# Patient Record
Sex: Male | Born: 1950 | Race: White | Hispanic: No | Smoking: Never smoker
Health system: Southern US, Community
[De-identification: ages and names within clinical notes are randomized; demographics above are authoritative.]

## PROBLEM LIST (undated history)

## (undated) DIAGNOSIS — E7800 Pure hypercholesterolemia, unspecified: Secondary | ICD-10-CM

## (undated) DIAGNOSIS — C4490 Unspecified malignant neoplasm of skin, unspecified: Secondary | ICD-10-CM

## (undated) DIAGNOSIS — F419 Anxiety disorder, unspecified: Secondary | ICD-10-CM

## (undated) DIAGNOSIS — R51 Headache: Secondary | ICD-10-CM

## (undated) DIAGNOSIS — I10 Essential (primary) hypertension: Secondary | ICD-10-CM

## (undated) DIAGNOSIS — K219 Gastro-esophageal reflux disease without esophagitis: Secondary | ICD-10-CM

## (undated) DIAGNOSIS — K449 Diaphragmatic hernia without obstruction or gangrene: Secondary | ICD-10-CM

## (undated) HISTORY — DX: Unspecified malignant neoplasm of skin, unspecified: C44.90

## (undated) HISTORY — PX: OTHER SURGICAL HISTORY: SHX169

## (undated) HISTORY — DX: Pure hypercholesterolemia, unspecified: E78.00

## (undated) HISTORY — PX: EYE SURGERY: SHX253

## (undated) HISTORY — PX: APPENDECTOMY: SHX54

## (undated) HISTORY — PX: SINOSCOPY: SHX187

## (undated) HISTORY — PX: CATARACT EXTRACTION: SUR2

## (undated) HISTORY — PX: SKIN CANCER EXCISION: SHX779

## (undated) HISTORY — DX: Essential (primary) hypertension: I10

## (undated) HISTORY — DX: Diaphragmatic hernia without obstruction or gangrene: K44.9

---

## 2000-10-17 ENCOUNTER — Ambulatory Visit (HOSPITAL_COMMUNITY): Admission: RE | Admit: 2000-10-17 | Discharge: 2000-10-17 | Payer: Self-pay | Admitting: Internal Medicine

## 2001-05-19 ENCOUNTER — Ambulatory Visit (HOSPITAL_COMMUNITY): Admission: RE | Admit: 2001-05-19 | Discharge: 2001-05-19 | Payer: Self-pay | Admitting: Family Medicine

## 2001-05-19 ENCOUNTER — Encounter: Payer: Self-pay | Admitting: Family Medicine

## 2002-03-04 ENCOUNTER — Encounter: Payer: Self-pay | Admitting: Family Medicine

## 2002-03-04 ENCOUNTER — Ambulatory Visit (HOSPITAL_COMMUNITY): Admission: RE | Admit: 2002-03-04 | Discharge: 2002-03-05 | Payer: Self-pay | Admitting: Family Medicine

## 2003-07-11 ENCOUNTER — Ambulatory Visit (HOSPITAL_COMMUNITY): Admission: RE | Admit: 2003-07-11 | Discharge: 2003-07-11 | Payer: Self-pay | Admitting: Family Medicine

## 2005-01-03 ENCOUNTER — Ambulatory Visit (HOSPITAL_COMMUNITY): Admission: RE | Admit: 2005-01-03 | Discharge: 2005-01-03 | Payer: Self-pay | Admitting: Family Medicine

## 2005-03-08 ENCOUNTER — Ambulatory Visit (HOSPITAL_COMMUNITY): Admission: RE | Admit: 2005-03-08 | Discharge: 2005-03-08 | Payer: Self-pay | Admitting: Internal Medicine

## 2005-03-08 ENCOUNTER — Encounter (INDEPENDENT_AMBULATORY_CARE_PROVIDER_SITE_OTHER): Payer: Self-pay | Admitting: Internal Medicine

## 2005-03-08 ENCOUNTER — Ambulatory Visit: Payer: Self-pay | Admitting: Internal Medicine

## 2005-05-15 ENCOUNTER — Encounter (HOSPITAL_COMMUNITY): Admission: RE | Admit: 2005-05-15 | Discharge: 2005-06-14 | Payer: Self-pay | Admitting: Preventative Medicine

## 2005-07-22 ENCOUNTER — Ambulatory Visit (HOSPITAL_COMMUNITY): Admission: RE | Admit: 2005-07-22 | Discharge: 2005-07-22 | Payer: Self-pay | Admitting: Internal Medicine

## 2005-09-13 ENCOUNTER — Ambulatory Visit (HOSPITAL_COMMUNITY): Admission: RE | Admit: 2005-09-13 | Discharge: 2005-09-13 | Payer: Self-pay | Admitting: Family Medicine

## 2007-12-23 ENCOUNTER — Observation Stay (HOSPITAL_COMMUNITY): Admission: EM | Admit: 2007-12-23 | Discharge: 2007-12-24 | Payer: Self-pay | Admitting: Emergency Medicine

## 2007-12-23 ENCOUNTER — Encounter (INDEPENDENT_AMBULATORY_CARE_PROVIDER_SITE_OTHER): Payer: Self-pay | Admitting: General Surgery

## 2008-11-14 ENCOUNTER — Ambulatory Visit (HOSPITAL_COMMUNITY): Admission: RE | Admit: 2008-11-14 | Discharge: 2008-11-14 | Payer: Self-pay | Admitting: Family Medicine

## 2009-07-13 ENCOUNTER — Ambulatory Visit (HOSPITAL_COMMUNITY): Admission: RE | Admit: 2009-07-13 | Discharge: 2009-07-13 | Payer: Self-pay | Admitting: Family Medicine

## 2010-08-29 ENCOUNTER — Ambulatory Visit (INDEPENDENT_AMBULATORY_CARE_PROVIDER_SITE_OTHER): Payer: BC Managed Care – PPO | Admitting: Internal Medicine

## 2010-08-29 DIAGNOSIS — K219 Gastro-esophageal reflux disease without esophagitis: Secondary | ICD-10-CM

## 2010-09-09 NOTE — Consult Note (Signed)
NAME:  [name], [name] NO.:  [id number]  MEDICAL RECORD NO.:  [id number]           PATIENT TYPE:  LOCATION:                                 FACILITY:  PHYSICIAN:  Lionel December, M.D.    DATE OF BIRTH:  1951-02-07  DATE OF CONSULTATION: DATE OF DISCHARGE:                                CONSULTATION   Dennis Miles is a 60 year old male referred to our office by Charlann Lange, PA-C at [facility].  He was referred for esophageal pain and epigastric pain.  He had symptoms for the past 2 months.  He was started back on Nexium 2 months ago and states his symptoms are much better now.  He occasionally does have a sharp esophageal pain.  He says the pain was not constant when he had it.  He did have some dysphagia in the past 2 months but that has now resolved.  Actually all of his symptoms have resolved since he started the Nexium.  His appetite is good.  There has been no weight loss.  He usually has a bowel movement once a day.  He denies any melena or rectal bleeding.  His last colonoscopy was in October 2006.  It was a screening colonoscopy.  He also had occasional hematochezia felt to be secondary to hemorrhoids.  The biopsy revealed polyp at the sigmoid colon, inflamed fragments of colonic mucosa.  No tumor seen.  He is allergic to CODEINE which causes nausea.  He is on Nexium 20 mg a day, quinapril 20 mg a day, colchicine as needed, and allopurinol 100 mg a day.  SURGERIES:  He had sinus surgery and an appendectomy.  PREVIOUS MEDICAL HISTORY:  High cholesterol, hypertension, gout, and a hiatal hernia.  FAMILY HISTORY:  His mother is deceased from CHF and kidney failure. Father is deceased from COPD and he was bedridden.  Two sisters in good health.  One brother alive with a history of alcoholism.  He is divorced.  He works at [employer].  He does not smoke.  He drinks about 2 beers a day and he has 2 children in good health.  OBJECTIVE:  VITAL SIGNS:   His weight is 159, his height is 5 feet 9 inches, his temperature is 98, his blood pressure is 126/84, and his pulse is 72. HEENT:  He has natural teeth.  His oral mucosa is moist.  There are no lesions.  His conjunctivae are pink.  His sclerae are anicteric. NECK:  His thyroid is normal.  There is no cervical lymphadenopathy. LUNGS:  Clear. HEART:  Regular rate and rhythm. ABDOMEN:  Soft.  Bowel sounds are positive.  No masses.  No tenderness.  ASSESSMENT:  Dennis Miles is a 60 year old male presenting with complaints of gastroesophageal reflux disease which has now resolved since starting the Nexium.  He also was having dysphagia and that is cleared also.  He states he really does feel much better since starting the Nexium.  RECOMMENDATIONS:  He will continue to keep head of his bed up.  No spicy foods.  I will give him a prescription for Nexium 40 mg, #90.  He will call with a progress report in 2 weeks.  At this time, the symptoms are much improved, so we will hold an EGD/ED on him at this time.    ______________________________ Dennis Ar, NP   ______________________________ Lionel December, M.D.    TS/MEDQ  D:  08/29/2010  T:  08/30/2010  Job:  [id number]  Electronically Signed by Dennis Ar PA on 09/05/2010 04:52:28 PM Electronically Signed by Lionel December M.D. on 09/09/2010 02:04:13 PM

## 2010-10-16 NOTE — Op Note (Signed)
NAME:  Dennis Miles, Dennis Miles               ACCOUNT NO.:  [id number]   MEDICAL RECORD NO.:  [id number]          PATIENT TYPE:  AMB   LOCATION:  DAY                           FACILITY:  [facility]   PHYSICIAN:  Dalia Heading, M.D.  DATE OF BIRTH:  1951-04-12   DATE OF PROCEDURE:  12/23/2007  DATE OF DISCHARGE:                               OPERATIVE REPORT   PREOPERATIVE DIAGNOSIS:  Acute appendicitis.   POSTOPERATIVE DIAGNOSIS:  Meckel's diverticulitis.   PROCEDURE:  Laparoscopic Meckel's diverticulectomy, appendectomy.   SURGEON:  Dalia Heading, MD   ANESTHESIA:  General endotracheal.   INDICATIONS:  The patient is a 60 year old white male who presented to  the emergency room with a 24-hour history of worsening right lower  quadrant abdominal pain.  CT scan of the abdomen and pelvis revealed  acute appendicitis.  The patient now comes to the operating room for a  laparoscopic appendectomy.  The risks and benefits of the procedure  including bleeding, infection, and the possibility of an open procedure  were fully explained to the patient, gave informed consent.   PROCEDURE NOTE:  The patient was placed in the supine position.  After  induction of general endotracheal anesthesia, the abdomen was prepped  and draped using the usual sterile technique with Betadine.  Surgical  site confirmation was performed.   A supraumbilical incision was made down to the fascia.  Veress needle  was introduced into the abdominal cavity and confirmation of placement  was done using the saline drop test.  The abdomen was then insufflated  to 16 mmHg of pressure.  An 11-mm trocar was introduced into the  abdominal cavity under direct visualization without difficulty.  The  patient was placed in deeper Trendelenburg position.  Additional 12-mm  trocar was placed in the suprapubic region and a 5-mm trocar was placed  in the left lower quadrant region.  On inspection of the right lower  quadrant, the patient  was noted to have a large Meckel's diverticulum,  which appeared to be inflamed, adjacent to the appendix.  The  diverticulum appeared to be the source of the patient's pain.  It was  felt that removal of both was warranted.  The mesoappendix was divided  using the harmonic scalpel.  A vascular Endo-GIA was placed across the  base of the appendix and fired.  The appendix was removed using  Endocatch bag.  The staple line was inspected and noted to be within  normal limits.  The Meckel's diverticulum was removed at space using a  standard Endo-GIA.  The diverticulum was removed using Endocatch bag.  The staple line was inspected and noted to be within normal limits.  Surgicel was placed across the staple line.  The right lower quadrant  was copiously irrigated with normal saline.  All fluid and air were then  evacuated from the abdominal cavity prior to removal of the trocars.   All wounds were irrigated with normal saline.  All wounds were injected  with 0.5% Sensorcaine.  The supraumbilical fascia as well as suprapubic  fascia were reapproximated using an 0 Vicryl  interrupted suture.  All  skin incisions were closed using staples.  Betadine ointment and dry  sterile dressings were applied.   All tape and needle counts were correct at the end of the procedure.  The patient was extubated in the operating room and went back to the  recovery room awake in stable condition.   COMPLICATIONS:  None.   SPECIMEN:  Appendix, Meckel's diverticulum.   BLOOD LOSS:  Minimal.      Dalia Heading, M.D.  Electronically Signed     MAJ/MEDQ  D:  12/23/2007  T:  12/24/2007  Job:  [id number]   cc:   Madelin Rear. Sherwood Gambler, MD  Fax: [phone]

## 2010-10-19 NOTE — Op Note (Signed)
NAME:  Dennis Miles, Dennis Miles               ACCOUNT NO.:  [id number]   MEDICAL RECORD NO.:  [id number]          PATIENT TYPE:  AMB   LOCATION:  DAY                           FACILITY:  [facility]   PHYSICIAN:  Lionel December, M.D.    DATE OF BIRTH:  [birth year]   DATE OF PROCEDURE:  03/08/2005  DATE OF DISCHARGE:                                 OPERATIVE REPORT   PROCEDURE:  Colonoscopy.   INDICATION:  Mindy is a 60 year old Caucasian male who is here for  screening colonoscopy. He has occasional hematochezia felt to be secondary  to hemorrhoids. Procedure risks were reviewed with the patient, and informed  consent was obtained.   PREMEDICATION:  Demerol 50 mg IV, Versed 6 mg IV.   FINDINGS:  Procedure performed in endoscopy suite. The patient's vital signs  and O2 saturation were monitored during the procedure and remained stable.  The patient was placed in left lateral position. Rectal examination  performed. He had soft, small sentinel skin tags. Digital exam was normal.  Olympus videoscope was placed in rectum and advanced under vision into  sigmoid colon and beyond. Preparation was satisfactory. Scope was passed to  cecum which was identified by ileocecal valve and appendiceal orifice.  Pictures taken for the record. As the scope was withdrawn, colonic mucosa  was carefully examined. There was a 5-mm polyp at distal sigmoid colon which  was ablated easily via cold biopsy. Mucosa of the rest of the sigmoid colon  and rectum was normal. Scope was retroflexed to examine anorectal junction,  and small hemorrhoids were noted below the dentate line. Endoscope was  straightened and withdrawn. The patient tolerated the procedure well.   FINAL DIAGNOSIS:  1.  Small sigmoid polyp which was ablated via cold biopsy.  2.  External hemorrhoids.   RECOMMENDATIONS:  Standard instructions given.   I will be contacting the patient with biopsy results and further  recommendations.      Lionel December,  M.D.  Electronically Signed    NR/MEDQ  D:  03/08/2005  T:  03/08/2005  Job:  [id number]   cc:   Patrica Duel, M.D.  Fax: [phone]

## 2011-03-01 LAB — DIFFERENTIAL
Basophils Absolute: 0
Basophils Relative: 0
Eosinophils Absolute: 0.4
Eosinophils Relative: 3
Eosinophils Relative: 4
Lymphocytes Relative: 15
Lymphs Abs: 1.4
Monocytes Absolute: 0.9
Monocytes Relative: 10
Monocytes Relative: 12

## 2011-03-01 LAB — CBC
HCT: 39.2
MCHC: 35.2
MCV: 95.7
Platelets: 149 — ABNORMAL LOW
RBC: 4.09 — ABNORMAL LOW
RDW: 12.6

## 2011-03-01 LAB — BASIC METABOLIC PANEL
BUN: 6
Calcium: 8.3 — ABNORMAL LOW
Creatinine, Ser: 0.96
GFR calc non Af Amer: >60
GFR calc non Af Amer: >60
Glucose, Bld: 99

## 2011-03-01 LAB — URINALYSIS, ROUTINE W REFLEX MICROSCOPIC
Bilirubin Urine: NEGATIVE
Specific Gravity, Urine: 1.01

## 2011-04-01 ENCOUNTER — Other Ambulatory Visit (INDEPENDENT_AMBULATORY_CARE_PROVIDER_SITE_OTHER): Payer: Self-pay | Admitting: *Deleted

## 2011-04-01 ENCOUNTER — Encounter (INDEPENDENT_AMBULATORY_CARE_PROVIDER_SITE_OTHER): Payer: Self-pay | Admitting: Internal Medicine

## 2011-04-01 ENCOUNTER — Ambulatory Visit (INDEPENDENT_AMBULATORY_CARE_PROVIDER_SITE_OTHER): Payer: BC Managed Care – PPO | Admitting: Internal Medicine

## 2011-04-01 ENCOUNTER — Encounter (INDEPENDENT_AMBULATORY_CARE_PROVIDER_SITE_OTHER): Payer: Self-pay | Admitting: *Deleted

## 2011-04-01 VITALS — BP 102/82 | HR 72 | Temp 98.2°F | Ht 69.0 in | Wt 168.3 lb

## 2011-04-01 DIAGNOSIS — K2289 Other specified disease of esophagus: Secondary | ICD-10-CM

## 2011-04-01 DIAGNOSIS — K279 Peptic ulcer, site unspecified, unspecified as acute or chronic, without hemorrhage or perforation: Secondary | ICD-10-CM

## 2011-04-01 DIAGNOSIS — K228 Other specified diseases of esophagus: Secondary | ICD-10-CM

## 2011-04-01 DIAGNOSIS — I10 Essential (primary) hypertension: Secondary | ICD-10-CM | POA: Insufficient documentation

## 2011-04-01 DIAGNOSIS — M109 Gout, unspecified: Secondary | ICD-10-CM | POA: Insufficient documentation

## 2011-04-01 DIAGNOSIS — E782 Mixed hyperlipidemia: Secondary | ICD-10-CM | POA: Insufficient documentation

## 2011-04-01 MED ORDER — SUCRALFATE 1 GM/10ML PO SUSP: 1.0000 g | Freq: Four times a day (QID) | ORAL | Status: DC

## 2011-04-01 NOTE — Patient Instructions (Addendum)
Will schedule and EGD with Dr. Karilyn Cota.The risks and benefits such as perforation, bleeding, and infection were reviewed with the patient and is agreeable. Will E-prescribe an Rx for Carafate to [facility]

## 2011-04-01 NOTE — Progress Notes (Signed)
Subjective:     Patient ID: Dennis Miles, male   DOB: 01/29/1951, 60 y.o.   MRN: [id number]  HPIHe presents today with c/o that he his having severe pain at his Adam's apple and down his throat. Symptoms for 6 months.  Says it will get worse and then get better.  Sometimes it feels like a [place] has kicked him in his throat.   He takes a Zantac at night on a prn basis.  No dysphagia.  Symptms worse at night. Hx of gout.  Just getting over it now.  His appetite is good. He has gained weight from his last visit of approximately 9 pounds. He was seen in March of this year for esophageal pain and epigastric pain.  He had stopped his Nexium.  Once getting back on the Nexium his symptoms improved. He also had dysphagia which resolved after taking the Nexium. Review of Systems  See hpi Current Outpatient Prescriptions  Medication Sig Dispense Refill  . allopurinol (ZYLOPRIM) 100 MG tablet Take 100 mg by mouth daily.        . colchicine 0.6 MG tablet Take 0.6 mg by mouth daily.        Marland Kitchen esomeprazole (NEXIUM) 20 MG capsule Take 20 mg by mouth daily before breakfast.        . quinapril (ACCUPRIL) 20 MG tablet Take 20 mg by mouth at bedtime.        Marland Kitchen zolpidem (AMBIEN) 10 MG tablet Take 10 mg by mouth at bedtime as needed.        . sucralfate (CARAFATE) 1 GM/10ML suspension Take 10 mLs (1 g total) by mouth 4 (four) times daily.  420 mL  1   Past Surgical History  Procedure Date  . Sinus sugery   . Appendectomy    Past Medical History  Diagnosis Date  . Gout   . High cholesterol   . Hypertension   . Hiatal hernia    Allergies  Allergen Reactions  . Codeine    Family Status  Relation Status Death Age  . Mother Deceased     CHF  . Father Deceased     COPD.   Marland Kitchen Sister Alive     good health  . Brother Alive     Alcoholic    History   Social History  . Marital Status: Divorced    Spouse Name: N/A    Number of Children: N/A  . Years of Education: N/A   Occupational History  . Not on  file.   Social History Main Topics  . Smoking status: Never Smoker   . Smokeless tobacco: Not on file  . Alcohol Use: Yes     2 beers a day  . Drug Use: No  . Sexually Active: Not on file   Other Topics Concern  . Not on file   Social History Narrative  . No narrative on file           Objective:   Physical Exam Filed Vitals:   04/01/11 1643  BP: 102/82  Pulse: 72  Temp: 98.2 F (36.8 C)  Height: 5\' 9"  (1.753 m)  Weight: 168 lb 4.8 oz (76.34 kg)    Alert and oriented. Skin warm and dry. Oral mucosa is moist. Natural teeth in good condition. Sclera anicteric, conjunctivae is pink. Thyroid not enlarged. No cervical lymphadenopathy. Lungs clear. Heart regular rate and rhythm.  Abdomen is soft. Bowel sounds are positive. No hepatomegaly. No abdominal masses felt. No tenderness.  No edema to lower extremities. Patient is alert and oriented.      Assessment:    Esophageal pain for 6 months which has not improve with Nexium and Zantac.  PUD needs to be ruled out.  Plan:    EGD.  The risks and benefits such as perforation, bleeding, and infection were reviewed with the patient and is agreeable.

## 2011-04-11 ENCOUNTER — Encounter (HOSPITAL_COMMUNITY): Payer: Self-pay | Admitting: Pharmacy Technician

## 2011-04-16 MED ORDER — SODIUM CHLORIDE 0.45 % IV SOLN
Freq: Once | INTRAVENOUS | Status: AC
  Administered 2011-04-17: 08:00:00 via INTRAVENOUS

## 2011-04-17 ENCOUNTER — Ambulatory Visit (HOSPITAL_COMMUNITY)
Admission: RE | Admit: 2011-04-17 | Discharge: 2011-04-17 | Disposition: A | Discharge status: Home / Self Care | Payer: BC Managed Care – PPO | Source: Ambulatory Visit | Attending: Internal Medicine | Admitting: Internal Medicine

## 2011-04-17 ENCOUNTER — Encounter (HOSPITAL_COMMUNITY): Payer: Self-pay | Admitting: *Deleted

## 2011-04-17 ENCOUNTER — Encounter (HOSPITAL_COMMUNITY)
Admission: RE | Disposition: A | Discharge status: Home / Self Care | Payer: Self-pay | Source: Ambulatory Visit | Attending: Internal Medicine

## 2011-04-17 ENCOUNTER — Other Ambulatory Visit (INDEPENDENT_AMBULATORY_CARE_PROVIDER_SITE_OTHER): Payer: Self-pay | Admitting: Internal Medicine

## 2011-04-17 DIAGNOSIS — I10 Essential (primary) hypertension: Secondary | ICD-10-CM | POA: Insufficient documentation

## 2011-04-17 DIAGNOSIS — K219 Gastro-esophageal reflux disease without esophagitis: Secondary | ICD-10-CM | POA: Insufficient documentation

## 2011-04-17 DIAGNOSIS — K228 Other specified diseases of esophagus: Secondary | ICD-10-CM

## 2011-04-17 DIAGNOSIS — E7800 Pure hypercholesterolemia, unspecified: Secondary | ICD-10-CM | POA: Insufficient documentation

## 2011-04-17 DIAGNOSIS — Z79899 Other long term (current) drug therapy: Secondary | ICD-10-CM | POA: Insufficient documentation

## 2011-04-17 DIAGNOSIS — K2289 Other specified disease of esophagus: Secondary | ICD-10-CM

## 2011-04-17 DIAGNOSIS — K222 Esophageal obstruction: Secondary | ICD-10-CM

## 2011-04-17 DIAGNOSIS — R1013 Epigastric pain: Secondary | ICD-10-CM | POA: Insufficient documentation

## 2011-04-17 HISTORY — DX: Gastro-esophageal reflux disease without esophagitis: K21.9

## 2011-04-17 HISTORY — DX: Headache: R51

## 2011-04-17 HISTORY — PX: ESOPHAGOGASTRODUODENOSCOPY: SHX5428

## 2011-04-17 HISTORY — DX: Anxiety disorder, unspecified: F41.9

## 2011-04-17 SURGERY — EGD (ESOPHAGOGASTRODUODENOSCOPY)
Anesthesia: Moderate Sedation

## 2011-04-17 MED ORDER — MEPERIDINE HCL 50 MG/ML IJ SOLN
INTRAMUSCULAR | Status: AC
  Filled 2011-04-17: qty 1

## 2011-04-17 MED ORDER — BUTAMBEN-TETRACAINE-BENZOCAINE 2-2-14 % EX AERO
INHALATION_SPRAY | CUTANEOUS | Status: DC | PRN
  Administered 2011-04-17: 2 via TOPICAL

## 2011-04-17 MED ORDER — MIDAZOLAM HCL 5 MG/5ML IJ SOLN
INTRAMUSCULAR | Status: DC | PRN
  Administered 2011-04-17 (×4): 2 mg via INTRAVENOUS

## 2011-04-17 MED ORDER — MEPERIDINE HCL 25 MG/ML IJ SOLN
INTRAMUSCULAR | Status: DC | PRN
  Administered 2011-04-17 (×2): 25 mg via INTRAVENOUS

## 2011-04-17 MED ORDER — MIDAZOLAM HCL 5 MG/5ML IJ SOLN
INTRAMUSCULAR | Status: AC
  Filled 2011-04-17: qty 10

## 2011-04-17 MED ORDER — STERILE WATER FOR IRRIGATION IR SOLN
Status: DC | PRN
  Administered 2011-04-17: 09:00:00

## 2011-04-17 NOTE — H&P (Signed)
This is update to history and physical from 04/01/2011. Patient has chronic GERD refractory to therapy now. He is undergoing diagnostic EGD. He is up-to-date on his screening for colorectal carcinoma. His last colonoscopy was in 2006.

## 2011-04-17 NOTE — Op Note (Signed)
EGD PROCEDURE REPORT  PATIENT:  Dennis Miles  MR#:  [id number] Birthdate:  02-25-1951, 60 y.o., male Endoscopist:  Dr. Malissa Hippo, MD Referred By:  Ms. Terie Purser, [facility] / Dr. Madelin Rear. Fusco M.D. Procedure Date: 04/17/2011  Procedure:   EGD with ED  Indications:  Patient is 60 year-old Caucasian male with chronic GERD maintenance PPI was been experiencing recurrent anus upper sternal area as well as Adam's apple and regurgitation. He is doing better with PPI. He has history of esophageal stricture which was last dilated in may 2002. At the present time he denies dysphagia.            Informed Consent: Procedure and risks were reviewed with the patient and informed consent was obtained. Medications:  Demerol 50 mg IV Versed 8 mg IV Cetacaine spray topically for oropharyngeal anesthesia  Description of procedure:  The endoscope was introduced through the mouth and advanced to the second portion of the duodenum without difficulty or limitations. The mucosal surfaces were surveyed very carefully during advancement of the scope and upon withdrawal.  Findings:  Esophagus:  There were multiple fine circumferential rings most pronounced in the mid esophagus.no erosions or ulcers were noted. Stricture noted at GE junction she disrupted by passing the scope and subsequently with 54 [place] Maloney dilator.  GEJ:   39 cm Stomach:  Was empty and distended very well with insufflation. Folds in the proximal stomach were normal. Mucosa at body, antrum, pyloric channel, angularis, fundus and cardia was normal. Duodenum:  Normal bulbar and post bulbar mucosa.  Therapeutic/Diagnostic Maneuvers Performed:  Esophageal dilation performed by passing 54 [place] Maloney dilator resulting in linear tear involving distal 2 cm of the esophagus extending to GE and small focal area with mucosal disruption at proximal esophagus. Esophageal biopsy taken for routine histology.  Complications:   None  Impression: No evidence of erosive esophagitis. Distal esophageal stricture dilated by passing 54 [place] Maloney dilator. 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  Recommendations:  Continue anti-reflex measures and Nexium at 40 mg by mouth every morning. I will be contacting patient with results of biopsy.Marland Kitchen  REHMAN,NAJEEB U  04/17/2011  9:16 AM  CC: Ms. Terie Purser, [facility]         Dr. Madelin Rear. Sherwood Gambler, MD.

## 2011-04-23 ENCOUNTER — Encounter (INDEPENDENT_AMBULATORY_CARE_PROVIDER_SITE_OTHER): Payer: Self-pay | Admitting: *Deleted

## 2011-04-24 ENCOUNTER — Encounter (HOSPITAL_COMMUNITY): Payer: Self-pay | Admitting: Internal Medicine

## 2012-04-08 ENCOUNTER — Encounter (INDEPENDENT_AMBULATORY_CARE_PROVIDER_SITE_OTHER): Payer: Self-pay | Admitting: *Deleted

## 2012-04-15 ENCOUNTER — Encounter (INDEPENDENT_AMBULATORY_CARE_PROVIDER_SITE_OTHER): Payer: Self-pay | Admitting: Internal Medicine

## 2012-04-15 ENCOUNTER — Ambulatory Visit (INDEPENDENT_AMBULATORY_CARE_PROVIDER_SITE_OTHER): Payer: Managed Care, Other (non HMO) | Admitting: Internal Medicine

## 2012-04-15 VITALS — BP 118/80 | HR 72 | Temp 98.1°F | Ht 68.0 in | Wt 180.4 lb

## 2012-04-15 DIAGNOSIS — K219 Gastro-esophageal reflux disease without esophagitis: Secondary | ICD-10-CM | POA: Insufficient documentation

## 2012-04-15 MED ORDER — PANTOPRAZOLE SODIUM 40 MG PO TBEC: 40.0000 mg | DELAYED_RELEASE_TABLET | Freq: Every day | ORAL | Status: DC

## 2012-04-15 NOTE — Addendum Note (Signed)
Addended by: Len Blalock on: 04/15/2012 04:55 PM   Modules accepted: Orders

## 2012-04-15 NOTE — Patient Instructions (Signed)
Continue present medications. 

## 2012-04-15 NOTE — Progress Notes (Addendum)
Subjective:     Patient ID: Dennis Miles, male   DOB: Jul 26, 1950, 61 y.o.   MRN: [id number]  HPI Dennis Miles is  61 yr old male here today for f/u. He was last seen a year ago with c/o chest pain and dysphagia. Her stays tired all the time. Appetite is good. No weight loss.  He has actually gained 12 pounds since his last visit.  Acid reflux is better. He has had 2 episodes of choking. He had pill dysphagia. No solid food dysphagia.  BMs are normal. No melena or bright red rectal bleeding.    04/16/2012 EGD/ED: Impression:  No evidence of erosive esophagitis.  Distal esophageal stricture dilated by passing 54 [place] Maloney dilator.  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 Biopsy: No evidence of eosinophilic esophagitis. He is able to swallow much better.   Review of Systems see hpi Current Outpatient Prescriptions  Medication Sig Dispense Refill  . acetaminophen (TYLENOL) 650 MG CR tablet Take 650 mg by mouth every 8 (eight) hours as needed. For pain        . allopurinol (ZYLOPRIM) 100 MG tablet Take 100 mg by mouth as needed.       . cetirizine (ZYRTEC) 10 MG tablet Take 10 mg by mouth daily.        . colchicine 0.6 MG tablet Take 0.6 mg by mouth daily.        Marland Kitchen esomeprazole (NEXIUM) 20 MG capsule Take 20 mg by mouth daily before breakfast.        . fluticasone (FLONASE) 50 MCG/ACT nasal spray Place 1 spray into the nose daily as needed. For congestion      . naproxen sodium (ANAPROX) 220 MG tablet Take 220 mg by mouth 2 (two) times daily with a meal.      . quinapril (ACCUPRIL) 20 MG tablet Take 20 mg by mouth at bedtime.        Marland Kitchen zolpidem (AMBIEN) 10 MG tablet Take 10 mg by mouth at bedtime as needed. For sleep      . ranitidine (ZANTAC) 150 MG tablet Take 150 mg by mouth at bedtime.        Past Medical History  Diagnosis Date  . Gout   . High cholesterol   . Hypertension   . Hiatal hernia   . GERD (gastroesophageal reflux disease)   .  Anxiety   . Headache    Allergies  Allergen Reactions  . Codeine Nausea Only        Objective:   Physical Exam  Filed Vitals:   04/15/12 1614  BP: 118/80  Pulse: 72  Temp: 98.1 F (36.7 C)  Height: 5\' 8"  (1.727 m)  Weight: 180 lb 6.4 oz (81.829 kg)  Alert and oriented. Skin warm and dry. Oral mucosa is moist.   . Sclera anicteric, conjunctivae is pink. Thyroid not enlarged. No cervical lymphadenopathy. Lungs clear. Heart regular rate and rhythm.  Abdomen is soft. Bowel sounds are positive. No hepatomegaly. No abdominal masses felt. No tenderness.  No edema to lower extremities.       Assessment:    GERD controlled at this time. He did have 2 episodes of pill dysphagia. No solid food dysphagia. Acid reflux controlled at this time with his PPI.    Plan:     OV in 1 yrs. Refill on Protonix 40mg  po daily

## 2012-07-09 ENCOUNTER — Other Ambulatory Visit (HOSPITAL_COMMUNITY): Payer: Self-pay | Admitting: Family Medicine

## 2012-07-09 ENCOUNTER — Ambulatory Visit (HOSPITAL_COMMUNITY)
Admission: RE | Admit: 2012-07-09 | Discharge: 2012-07-09 | Disposition: A | Discharge status: Home / Self Care | Payer: Managed Care, Other (non HMO) | Source: Ambulatory Visit | Attending: Family Medicine | Admitting: Family Medicine

## 2012-07-09 DIAGNOSIS — M25529 Pain in unspecified elbow: Secondary | ICD-10-CM | POA: Insufficient documentation

## 2012-07-09 DIAGNOSIS — X58XXXA Exposure to other specified factors, initial encounter: Secondary | ICD-10-CM | POA: Insufficient documentation

## 2012-07-09 DIAGNOSIS — S5000XA Contusion of unspecified elbow, initial encounter: Secondary | ICD-10-CM

## 2012-10-09 ENCOUNTER — Other Ambulatory Visit (HOSPITAL_COMMUNITY): Payer: Self-pay | Admitting: Internal Medicine

## 2012-10-09 ENCOUNTER — Ambulatory Visit (HOSPITAL_COMMUNITY)
Admission: RE | Admit: 2012-10-09 | Discharge: 2012-10-09 | Disposition: A | Discharge status: Home / Self Care | Payer: Managed Care, Other (non HMO) | Source: Ambulatory Visit | Attending: Internal Medicine | Admitting: Internal Medicine

## 2012-10-09 DIAGNOSIS — N508 Other specified disorders of male genital organs: Secondary | ICD-10-CM | POA: Insufficient documentation

## 2012-10-09 DIAGNOSIS — R1032 Left lower quadrant pain: Secondary | ICD-10-CM | POA: Insufficient documentation

## 2012-10-09 DIAGNOSIS — N5089 Other specified disorders of the male genital organs: Secondary | ICD-10-CM

## 2012-10-09 DIAGNOSIS — N50812 Left testicular pain: Secondary | ICD-10-CM

## 2012-10-09 DIAGNOSIS — R10814 Left lower quadrant abdominal tenderness: Secondary | ICD-10-CM

## 2012-10-09 DIAGNOSIS — N433 Hydrocele, unspecified: Secondary | ICD-10-CM | POA: Insufficient documentation

## 2012-10-09 DIAGNOSIS — I861 Scrotal varices: Secondary | ICD-10-CM | POA: Insufficient documentation

## 2012-10-09 DIAGNOSIS — N509 Disorder of male genital organs, unspecified: Secondary | ICD-10-CM | POA: Insufficient documentation

## 2012-10-09 MED ORDER — IOHEXOL 300 MG/ML  SOLN
50.0000 mL | Freq: Once | INTRAMUSCULAR | Status: AC | PRN
  Administered 2012-10-09: 50 mL via ORAL

## 2012-10-09 MED ORDER — IOHEXOL 300 MG/ML  SOLN
100.0000 mL | Freq: Once | INTRAMUSCULAR | Status: AC | PRN
  Administered 2012-10-09: 100 mL via INTRAVENOUS

## 2012-10-16 ENCOUNTER — Telehealth: Payer: Self-pay | Admitting: Internal Medicine

## 2012-10-16 ENCOUNTER — Encounter (INDEPENDENT_AMBULATORY_CARE_PROVIDER_SITE_OTHER): Payer: Self-pay | Admitting: *Deleted

## 2012-10-16 NOTE — Telephone Encounter (Signed)
Information noted. It makes sense for patient to stay with Dr. Karilyn Cota, however, Dr. Vevelyn Pat me and asks [place] to see ASAP.. I  conclude that he intended for him to followup with Dr. Karilyn Cota expeditiously

## 2012-10-16 NOTE — Telephone Encounter (Signed)
I received a referral from [facility] on May 9 and since this is an established patient of NUR I forwarded the referral to his office. This morning per RMR and CM I was to call the patient to make him an URG OV with [place] because the patient was not getting any better per PCP. I called patient and he said he has been seeing NUR for 20+ years and would feel more comfortable staying with him. Patient said that he thought his PCP understood that he wanted to stay with NUR. I told patient that I would call NUR office to let them know about referral and to call to set up his OV.

## 2012-10-19 ENCOUNTER — Telehealth (INDEPENDENT_AMBULATORY_CARE_PROVIDER_SITE_OTHER): Payer: Self-pay | Admitting: *Deleted

## 2012-10-19 ENCOUNTER — Encounter (INDEPENDENT_AMBULATORY_CARE_PROVIDER_SITE_OTHER): Payer: Self-pay | Admitting: Internal Medicine

## 2012-10-19 ENCOUNTER — Ambulatory Visit (INDEPENDENT_AMBULATORY_CARE_PROVIDER_SITE_OTHER): Payer: Managed Care, Other (non HMO) | Admitting: Internal Medicine

## 2012-10-19 ENCOUNTER — Other Ambulatory Visit (INDEPENDENT_AMBULATORY_CARE_PROVIDER_SITE_OTHER): Payer: Self-pay | Admitting: *Deleted

## 2012-10-19 DIAGNOSIS — R1031 Right lower quadrant pain: Secondary | ICD-10-CM

## 2012-10-19 DIAGNOSIS — R1030 Lower abdominal pain, unspecified: Secondary | ICD-10-CM | POA: Insufficient documentation

## 2012-10-19 DIAGNOSIS — R935 Abnormal findings on diagnostic imaging of other abdominal regions, including retroperitoneum: Secondary | ICD-10-CM

## 2012-10-19 DIAGNOSIS — R1032 Left lower quadrant pain: Secondary | ICD-10-CM

## 2012-10-19 DIAGNOSIS — G8929 Other chronic pain: Secondary | ICD-10-CM

## 2012-10-19 MED ORDER — PEG-KCL-NACL-NASULF-NA ASC-C 100 G PO SOLR: 1.0000 | Freq: Once | ORAL | Status: DC

## 2012-10-19 MED ORDER — CIPROFLOXACIN HCL 500 MG PO TABS: 500.0000 mg | ORAL_TABLET | Freq: Two times a day (BID) | ORAL | Status: DC

## 2012-10-19 MED ORDER — METRONIDAZOLE 500 MG PO TABS: 500.0000 mg | ORAL_TABLET | Freq: Three times a day (TID) | ORAL | Status: DC

## 2012-10-19 NOTE — Progress Notes (Addendum)
Subjective:     Patient ID: Dennis Miles, male   DOB: January 22, 1951, 62 y.o.   MRN: [id number]  HPI Referred to our office for rectal bleeding. The week in April, he tells me he vomited for 12 hrs and then had diarrhea. He thinks he had a fever.Since then, he has had a pain in his left groin.  He had a CT scan and [place]. Pain for over a month.  No appetite.  He has lost 20 pounds since November.   He has a BM x 1 a day. Normal caliber. No pain with his BM. Sometimes he feels like he has the urge to have a BM. He is presently on Cipro and Flagyl for prostatits. He says it is very uncomfortable. Small amt of rectal bleeding 3-4 weeks ago. More uncomfortable in the evening and at night. Urine occasional dark.    10/09/2012 WBC 4.8, H and H 15.7 an 44.0, Platelet ct 210.  Total bili 0.9, ALp 69, AST 23, ALT 17.   CBC    Component Value Date/Time   WBC 7.1 12/24/2007 0614   RBC 3.82* 12/24/2007 0614   HGB 12.7* 12/24/2007 0614   HCT 36.9* 12/24/2007 0614   PLT 149* 12/24/2007 0614   MCV 96.5 12/24/2007 0614   MCHC 34.5 12/24/2007 0614   RDW 12.3 12/24/2007 0614   LYMPHSABS 1.2 12/24/2007 0614   MONOABS 0.8 12/24/2007 0614   EOSABS 0.2 12/24/2007 0614   BASOSABS 0.0 12/24/2007 0614   .10/09/2012 Colonoscopy: IMPRESSION:  Trace pelvic free fluid which is generally considered abnormal but  nonspecific in a male patient. This may suggest bowel pathology,  but there is no area of bowel wall thickening or focal segmental  dilatation. Subclinical diverticulitis or other enteritis could  have this appearance. Close clinical follow-up is recommended. If  the patient has not had screening colonoscopy or virtual  colonoscopy as part of routine surveillance, this could be  considered when symptoms have resolved.     03/08/2005 Screening colonoscopy (occasionally hematochezia): FINAL DIAGNOSIS:  1. Small sigmoid polyp which was ablated via cold biopsy.  2. External hemorrhoids. 03/08/2005 Biopsy:   DIAGNOSIS: 1) polyp (Sigmoid colon, polypectomy): INFLAMED FRAGMENT OF COLONIC MUCOSA. NO TUMOR SEEN.   04/07/2011 EGD/ED: Impression:  No evidence of erosive esophagitis.  Distal esophageal stricture dilated by passing 54 [place] Maloney dilator.  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       Review of Systems Current Outpatient Prescriptions  Medication Sig Dispense Refill  . acetaminophen (TYLENOL) 650 MG CR tablet Take 650 mg by mouth every 8 (eight) hours as needed. For pain        . naproxen sodium (ANAPROX) 220 MG tablet Take 220 mg by mouth 2 (two) times daily with a meal.      . pantoprazole (PROTONIX) 40 MG tablet Take 1 tablet (40 mg total) by mouth daily.  30 tablet  11  . quinapril (ACCUPRIL) 20 MG tablet Take 20 mg by mouth at bedtime.        . tamsulosin (FLOMAX) 0.4 MG CAPS Take by mouth.      . zolpidem (AMBIEN) 10 MG tablet Take 10 mg by mouth at bedtime as needed. For sleep      . allopurinol (ZYLOPRIM) 100 MG tablet Take 100 mg by mouth as needed.       . cetirizine (ZYRTEC) 10 MG tablet Take 10 mg by mouth daily.        Marland Kitchen  ciprofloxacin (CIPRO) 500 MG tablet Take 1 tablet (500 mg total) by mouth 2 (two) times daily.  14 tablet  0  . colchicine 0.6 MG tablet Take 0.6 mg by mouth daily.        . fluticasone (FLONASE) 50 MCG/ACT nasal spray Place 1 spray into the nose daily as needed. For congestion      . metroNIDAZOLE (FLAGYL) 500 MG tablet Take 1 tablet (500 mg total) by mouth 3 (three) times daily.  21 tablet  0  . peg 3350 powder (MOVIPREP) 100 G SOLR Take 1 kit (100 g total) by mouth once.  1 kit  0   No current facility-administered medications for this visit.   Past Medical History  Diagnosis Date  . Gout   . High cholesterol   . Hypertension   . GERD (gastroesophageal reflux disease)   . Anxiety   . Headache   . Hiatal hernia    Past Surgical History  Procedure Laterality Date  . Sinus sugery    .  Appendectomy    . Esophagogastroduodenoscopy  04/17/2011    Procedure: ESOPHAGOGASTRODUODENOSCOPY (EGD);  Surgeon: Malissa Hippo, MD;  Location: [facility];  Service: Endoscopy;  Laterality: N/A;  8:30   Allergies  Allergen Reactions  . Codeine Nausea Only       Objective:   Physical Exam  Filed Vitals:   10/19/12 1434  BP: 132/78  Pulse: 60  Height: 5\' 8"  (1.727 m)  Weight: 157 lb 12.8 oz (71.578 kg)  Alert and oriented. Skin warm and dry. Oral mucosa is moist.   . Sclera anicteric, conjunctivae is pink. Thyroid not enlarged. No cervical lymphadenopathy. Lungs clear. Heart regular rate and rhythm.  Abdomen is soft. Bowel sounds are positive. No hepatomegaly. No abdominal masses felt.Tenderness left lower quadrant.  No edema to lower extremities.        Assessment:     Left lower quadrant pain. ? Etiology.  ? Diverticulitis. Colonic neoplasm needs to be ruled out.  I discussed this case with Dr. Karilyn Cota.     Plan:   Colonoscopy with Dr. Karilyn Cota. Will continue Cipro and Flagyl for 7 more days.

## 2012-10-19 NOTE — Telephone Encounter (Signed)
Patient needs movi prep 

## 2012-10-19 NOTE — Patient Instructions (Addendum)
Colonoscopy.  The risks and benefits such as perforation, bleeding, and infection were reviewed with the patient and is agreeable. 

## 2012-10-20 ENCOUNTER — Encounter (HOSPITAL_COMMUNITY): Payer: Self-pay | Admitting: Pharmacy Technician

## 2012-10-21 ENCOUNTER — Encounter (INDEPENDENT_AMBULATORY_CARE_PROVIDER_SITE_OTHER): Payer: Self-pay

## 2012-10-23 ENCOUNTER — Encounter (HOSPITAL_COMMUNITY): Payer: Self-pay | Admitting: *Deleted

## 2012-10-23 ENCOUNTER — Ambulatory Visit (HOSPITAL_COMMUNITY)
Admission: RE | Admit: 2012-10-23 | Discharge: 2012-10-23 | Disposition: A | Discharge status: Home / Self Care | Payer: Managed Care, Other (non HMO) | Source: Ambulatory Visit | Attending: Internal Medicine | Admitting: Internal Medicine

## 2012-10-23 ENCOUNTER — Encounter (HOSPITAL_COMMUNITY)
Admission: RE | Disposition: A | Discharge status: Home / Self Care | Payer: Self-pay | Source: Ambulatory Visit | Attending: Internal Medicine

## 2012-10-23 DIAGNOSIS — D128 Benign neoplasm of rectum: Secondary | ICD-10-CM | POA: Insufficient documentation

## 2012-10-23 DIAGNOSIS — K5730 Diverticulosis of large intestine without perforation or abscess without bleeding: Secondary | ICD-10-CM

## 2012-10-23 DIAGNOSIS — K921 Melena: Secondary | ICD-10-CM

## 2012-10-23 DIAGNOSIS — D129 Benign neoplasm of anus and anal canal: Secondary | ICD-10-CM | POA: Insufficient documentation

## 2012-10-23 DIAGNOSIS — R198 Other specified symptoms and signs involving the digestive system and abdomen: Secondary | ICD-10-CM | POA: Insufficient documentation

## 2012-10-23 DIAGNOSIS — R1032 Left lower quadrant pain: Secondary | ICD-10-CM

## 2012-10-23 DIAGNOSIS — K644 Residual hemorrhoidal skin tags: Secondary | ICD-10-CM | POA: Insufficient documentation

## 2012-10-23 DIAGNOSIS — I10 Essential (primary) hypertension: Secondary | ICD-10-CM | POA: Insufficient documentation

## 2012-10-23 DIAGNOSIS — E7800 Pure hypercholesterolemia, unspecified: Secondary | ICD-10-CM | POA: Insufficient documentation

## 2012-10-23 DIAGNOSIS — Z885 Allergy status to narcotic agent status: Secondary | ICD-10-CM | POA: Insufficient documentation

## 2012-10-23 DIAGNOSIS — Z791 Long term (current) use of non-steroidal anti-inflammatories (NSAID): Secondary | ICD-10-CM | POA: Insufficient documentation

## 2012-10-23 DIAGNOSIS — M109 Gout, unspecified: Secondary | ICD-10-CM | POA: Insufficient documentation

## 2012-10-23 DIAGNOSIS — K219 Gastro-esophageal reflux disease without esophagitis: Secondary | ICD-10-CM | POA: Insufficient documentation

## 2012-10-23 DIAGNOSIS — R109 Unspecified abdominal pain: Secondary | ICD-10-CM

## 2012-10-23 DIAGNOSIS — Z79899 Other long term (current) drug therapy: Secondary | ICD-10-CM | POA: Insufficient documentation

## 2012-10-23 DIAGNOSIS — R935 Abnormal findings on diagnostic imaging of other abdominal regions, including retroperitoneum: Secondary | ICD-10-CM

## 2012-10-23 HISTORY — PX: COLONOSCOPY: SHX5424

## 2012-10-23 LAB — CBC WITH DIFFERENTIAL/PLATELET
Basophils Absolute: 0 10*3/uL (ref 0.0–0.1)
Basophils Relative: 0 % (ref 0–1)
Eosinophils Absolute: 0.1 10*3/uL (ref 0.0–0.7)
MCH: 33.1 pg (ref 26.0–34.0)
MCHC: 35 g/dL (ref 30.0–36.0)
Neutrophils Relative %: 69 % (ref 43–77)
Platelets: 203 10*3/uL (ref 150–400)
RDW: 12.2 % (ref 11.5–15.5)

## 2012-10-23 LAB — SEDIMENTATION RATE: Sed Rate: 7 mm/hr (ref 0–16)

## 2012-10-23 SURGERY — COLONOSCOPY
Anesthesia: Moderate Sedation

## 2012-10-23 MED ORDER — STERILE WATER FOR IRRIGATION IR SOLN
Status: DC | PRN
  Administered 2012-10-23: 15:00:00

## 2012-10-23 MED ORDER — MEPERIDINE HCL 50 MG/ML IJ SOLN
INTRAMUSCULAR | Status: AC
  Filled 2012-10-23: qty 1

## 2012-10-23 MED ORDER — MIDAZOLAM HCL 5 MG/5ML IJ SOLN
INTRAMUSCULAR | Status: AC
  Filled 2012-10-23: qty 10

## 2012-10-23 MED ORDER — SODIUM CHLORIDE 0.9 % IV SOLN
INTRAVENOUS | Status: DC
  Administered 2012-10-23: 1000 mL via INTRAVENOUS

## 2012-10-23 MED ORDER — MIDAZOLAM HCL 5 MG/5ML IJ SOLN
INTRAMUSCULAR | Status: DC | PRN
  Administered 2012-10-23 (×3): 2 mg via INTRAVENOUS

## 2012-10-23 MED ORDER — MEPERIDINE HCL 50 MG/ML IJ SOLN
INTRAMUSCULAR | Status: DC | PRN
  Administered 2012-10-23 (×2): 25 mg via INTRAVENOUS

## 2012-10-23 MED ORDER — HYDROCODONE-ACETAMINOPHEN 5-300 MG PO TABS: 1.0000 | ORAL_TABLET | Freq: Three times a day (TID) | ORAL | Status: DC | PRN

## 2012-10-23 NOTE — H&P (Signed)
Dennis Miles is an 62 y.o. male.   Chief Complaint: Patient's here for colonoscopy. HPI: Patient is 62 year old Caucasian male who presents with a one-month history of pain and left lower quadrants abdomen soft. And scrotal region was been treated with antibiotics for presumed diverticulitis he has improved. Scrotal ultrasound was unremarkable. Abdominopelvic CT 2 weeks ago which revealed trace fluid in the pelvis but no definite test abnormality was noted. He has lost appetite. He has lost 10 pounds his symptoms began. He also is noted decrease in the caliber of stools and has intermittent hematochezia and on passes, the blood. Patient's last colonoscopy was in October 2006.  He does not feel well but he denies fever or chills. History is negative for CRC.  Past Medical History  Diagnosis Date  . Gout   . High cholesterol   . Hypertension   . GERD (gastroesophageal reflux disease)   . Anxiety   . Headache   . Hiatal hernia     Past Surgical History  Procedure Laterality Date  . Sinus sugery    . Appendectomy    . Esophagogastroduodenoscopy  04/17/2011    Procedure: ESOPHAGOGASTRODUODENOSCOPY (EGD);  Surgeon: Malissa Hippo, MD;  Location: [facility];  Service: Endoscopy;  Laterality: N/A;  8:30    Family History  Problem Relation Age of Onset  . Colon cancer Neg Hx    Social History:  reports that he has never smoked. He does not have any smokeless tobacco history on file. He reports that  drinks alcohol. He reports that he does not use illicit drugs.  Allergies:  Allergies  Allergen Reactions  . Codeine Nausea Only    Medications Prior to Admission  Medication Sig Dispense Refill  . ciprofloxacin (CIPRO) 500 MG tablet Take 1 tablet (500 mg total) by mouth 2 (two) times daily.  14 tablet  0  . fluticasone (FLONASE) 50 MCG/ACT nasal spray Place 1 spray into the nose daily as needed. For congestion      . metroNIDAZOLE (FLAGYL) 500 MG tablet Take 1 tablet (500 mg total)  by mouth 3 (three) times daily.  21 tablet  0  . naproxen sodium (ALEVE) 220 MG tablet Take 220 mg by mouth 2 (two) times daily with a meal.      . pantoprazole (PROTONIX) 40 MG tablet Take 1 tablet (40 mg total) by mouth daily.  30 tablet  11  . peg 3350 powder (MOVIPREP) 100 G SOLR Take 1 kit (100 g total) by mouth once.  1 kit  0  . quinapril (ACCUPRIL) 20 MG tablet Take 20 mg by mouth at bedtime.        . tamsulosin (FLOMAX) 0.4 MG CAPS Take 0.4 mg by mouth daily after supper.       . zolpidem (AMBIEN) 10 MG tablet Take 10 mg by mouth at bedtime as needed. For sleep        No results found for this or any previous visit (from the past 48 hour(s)). No results found.  ROS  Pulse 63, temperature 98.1 F (36.7 C), temperature source Oral, resp. rate 17, height 5\' 8"  (1.727 m), weight 157 lb (71.215 kg), SpO2 99.00%. Physical Exam  Constitutional: He appears well-developed and well-nourished.  HENT:  Mouth/Throat: Oropharynx is clear and moist.  Eyes: Conjunctivae are normal. No scleral icterus.  Neck: No thyromegaly present.  Cardiovascular: Normal rate, regular rhythm and normal heart sounds.   No murmur heard. Respiratory: Effort normal and breath sounds normal.  GI: Soft. He exhibits no distension. There is tenderness (Mild tenderness at LLQ).  Musculoskeletal: He exhibits no edema.  Lymphadenopathy:    He has no cervical adenopathy.  Neurological: He is alert.  Skin: Skin is warm and dry.     Assessment/Plan LLQ abdominal pain change in bowel habits and hematochezia. Diagnostic colonoscopy.  REHMAN,NAJEEB U 10/23/2012, 2:49 PM

## 2012-10-23 NOTE — Op Note (Addendum)
COLONOSCOPY PROCEDURE REPORT  PATIENT:  Dennis Miles  MR#:  [id number] Birthdate:  [birth year], 62 y.o., male Endoscopist:  Dr. Malissa Hippo, MD Referred By:  Dr. Madelin Rear. Sherwood Gambler, MD  Procedure Date: 10/23/2012  Procedure:   Colonoscopy  Indications: Patient is 62 year old Caucasian male who presents with lower four-week history of lower abdominal pain initially thought to be secondary to diverticulitis but he has not improved antibiotics. Abdominopelvic CT 2 weeks ago which revealed scant amount of fluid in pelvis. He also complains of change in caliber of stools Intermittent hematochezia and he has anorexia and has lost 10 pounds. He is undergoing diagnostic colonoscopy.  Informed Consent:  The procedure and risks were reviewed with the patient and informed consent was obtained.  Medications:  Demerol 50 mg IV Versed 6 mg IV  Description of procedure:  After a digital rectal exam was performed, that colonoscope was advanced from the anus through the rectum and colon to the area of the cecum, ileocecal valve and appendiceal orifice. The cecum was deeply intubated. These structures were well-seen and photographed for the record. From the level of the cecum and ileocecal valve, the scope was slowly and cautiously withdrawn. The mucosal surfaces were carefully surveyed utilizing scope tip to flexion to facilitate fold flattening as needed. The scope was pulled down into the rectum where a thorough exam including retroflexion was performed. Terminal ileum was also examined.  Findings:   Prep excellent. Normal mucosa of terminal ileum. Few small diverticula and sigmoid colon and one at splenic flexure. Small polyps ablated via cold biopsy from rectum. Small hemorrhoids below the dentate line.    Therapeutic/Diagnostic Maneuvers Performed:  See above  Complications:  None  Cecal Withdrawal Time:  13 minutes  Impression:  Normal terminal ileum. Few small diverticula at sigmoid  colon and one at splenic flexure. No evidence of diverticulitis or stricture. Small rectal polyp ablated via cold biopsy. External hemorrhoids.  Recommendations:  Standard instructions given. Patient advised to continue Cipro but discontinue metronidazole. Patient also advised to discontinue naproxen. Vicodin 5-300  times a day when necessary. CBC with differential and sedimentation rate today. I will contact patient with biopsy results and further recommendations.  [name]  10/23/2012 3:30 PM  CC: Dr. Cassell Smiles., MD & Dr. Bonnetta Barry ref. provider found

## 2012-10-27 ENCOUNTER — Encounter (HOSPITAL_COMMUNITY): Payer: Self-pay | Admitting: Internal Medicine

## 2012-10-27 ENCOUNTER — Other Ambulatory Visit (HOSPITAL_COMMUNITY): Payer: Self-pay | Admitting: Internal Medicine

## 2012-10-27 DIAGNOSIS — M549 Dorsalgia, unspecified: Secondary | ICD-10-CM

## 2012-10-28 ENCOUNTER — Ambulatory Visit (HOSPITAL_COMMUNITY)
Admission: RE | Admit: 2012-10-28 | Discharge: 2012-10-28 | Disposition: A | Discharge status: Home / Self Care | Payer: Managed Care, Other (non HMO) | Source: Ambulatory Visit | Attending: Internal Medicine | Admitting: Internal Medicine

## 2012-10-28 DIAGNOSIS — M549 Dorsalgia, unspecified: Secondary | ICD-10-CM

## 2012-10-28 DIAGNOSIS — R209 Unspecified disturbances of skin sensation: Secondary | ICD-10-CM | POA: Insufficient documentation

## 2012-10-28 DIAGNOSIS — M5137 Other intervertebral disc degeneration, lumbosacral region: Secondary | ICD-10-CM | POA: Insufficient documentation

## 2012-10-28 DIAGNOSIS — M51379 Other intervertebral disc degeneration, lumbosacral region without mention of lumbar back pain or lower extremity pain: Secondary | ICD-10-CM | POA: Insufficient documentation

## 2012-10-28 DIAGNOSIS — M25559 Pain in unspecified hip: Secondary | ICD-10-CM | POA: Insufficient documentation

## 2012-10-28 DIAGNOSIS — M47817 Spondylosis without myelopathy or radiculopathy, lumbosacral region: Secondary | ICD-10-CM | POA: Insufficient documentation

## 2012-10-28 DIAGNOSIS — M545 Low back pain, unspecified: Secondary | ICD-10-CM | POA: Insufficient documentation

## 2012-11-18 ENCOUNTER — Encounter (INDEPENDENT_AMBULATORY_CARE_PROVIDER_SITE_OTHER): Payer: Self-pay | Admitting: *Deleted

## 2012-11-23 ENCOUNTER — Telehealth (INDEPENDENT_AMBULATORY_CARE_PROVIDER_SITE_OTHER): Payer: Self-pay | Admitting: *Deleted

## 2012-11-23 NOTE — Telephone Encounter (Signed)
Noted will forward to Dr.Rehman as FYI.

## 2012-11-23 NOTE — Telephone Encounter (Signed)
LM stating he was calling in regards to Dr. Patty Sermons message. His discomfort is some better but not gone. Dennis Miles will be going back to the Urologist on 12/02/12 and will call to report. If any question, his return phone number is [phone].

## 2012-12-02 NOTE — Telephone Encounter (Signed)
Noted and agree with plans for followup with patient's urologist

## 2012-12-03 ENCOUNTER — Other Ambulatory Visit: Payer: Self-pay | Admitting: Dermatology

## 2013-01-26 ENCOUNTER — Ambulatory Visit (INDEPENDENT_AMBULATORY_CARE_PROVIDER_SITE_OTHER): Payer: Managed Care, Other (non HMO) | Admitting: Urology

## 2013-01-26 DIAGNOSIS — K4090 Unilateral inguinal hernia, without obstruction or gangrene, not specified as recurrent: Secondary | ICD-10-CM

## 2013-02-10 ENCOUNTER — Encounter (HOSPITAL_COMMUNITY): Payer: Self-pay | Admitting: Pharmacy Technician

## 2013-02-11 ENCOUNTER — Encounter (HOSPITAL_COMMUNITY)
Admission: RE | Admit: 2013-02-11 | Discharge: 2013-02-11 | Disposition: A | Discharge status: Home / Self Care | Payer: Managed Care, Other (non HMO) | Source: Ambulatory Visit | Attending: General Surgery | Admitting: General Surgery

## 2013-02-11 ENCOUNTER — Encounter (HOSPITAL_COMMUNITY): Payer: Self-pay

## 2013-02-11 DIAGNOSIS — Z01810 Encounter for preprocedural cardiovascular examination: Secondary | ICD-10-CM | POA: Insufficient documentation

## 2013-02-11 DIAGNOSIS — Z01818 Encounter for other preprocedural examination: Secondary | ICD-10-CM | POA: Insufficient documentation

## 2013-02-11 DIAGNOSIS — Z01812 Encounter for preprocedural laboratory examination: Secondary | ICD-10-CM | POA: Insufficient documentation

## 2013-02-11 LAB — CBC
MCV: 96.8 fL (ref 78.0–100.0)
Platelets: 195 10*3/uL (ref 150–400)
RDW: 11.8 % (ref 11.5–15.5)
WBC: 5.3 10*3/uL (ref 4.0–10.5)

## 2013-02-11 LAB — BASIC METABOLIC PANEL
Chloride: 98 mEq/L (ref 96–112)
Creatinine, Ser: 1.06 mg/dL (ref 0.50–1.35)
GFR calc Af Amer: 86 mL/min — ABNORMAL LOW (ref >90)
Potassium: 4.2 mEq/L (ref 3.5–5.1)

## 2013-02-11 NOTE — H&P (Signed)
  NTS SOAP Note  Vital Signs:  Vitals as of: 02/02/2013: Systolic 150: Diastolic 96: Heart Rate 82: Temp 99.69F: Height 83ft 8in: Weight 151Lbs 0 Ounces: Pain Level 5: BMI 22.96  BMI : 22.96 kg/m2  Subjective: This 62 Years 42 Months old Male presents for of left groin pain.  Has been present for several months.  Has had extensive workup by GI and GU which has been unremarkable.  Referred for possible left inguinal hernia.  States pain is made worse with straining to move bowels.  Point tenderness noted in left groin region, no lump noted.  Pain radiates to the left inner thigh.  Gabapentin not too helpful.  Pain has eased up some recently.  Review of Symptoms:  Constitutional:  fatigue Head:unremarkable    Eyes:unremarkable   Nose/Mouth/Throat:unremarkable Cardiovascular:  unremarkable   Respiratory:unremarkable   Genitourinary:unremarkable       joint pain Skin:unremarkable Hematolgic/Lymphatic:unremarkable     Allergic/Immunologic:unremarkable     Past Medical History:    Reviewed   Past Medical History  Surgical History: appy Medical Problems: HTN, high cholesterol Psychiatric History:  Anxiety Allergies: codeine Medications: gabapentin prn, tamsulosin, qulnapril   Social History:Reviewed  Social History  Preferred Language: English Race:  White Ethnicity: Not Hispanic / Latino Age: 62 Years 0 Months Marital Status:  D Alcohol: social Recreational drug(s):  No   Smoking Status: Unknown if ever smoked reviewed on 02/02/2013 Functional Status reviewed on mm/dd/yyyy ------------------------------------------------ Bathing: Normal Cooking: Normal Dressing: Normal Driving: Normal Eating: Normal Managing Meds: Normal Oral Care: Normal Shopping: Normal Toileting: Normal Transferring: Normal Walking: Normal Cognitive Status reviewed on mm/dd/yyyy ------------------------------------------------ Attention:  Normal Decision Making: Normal Language: Normal Memory: Normal Motor: Normal Perception: Normal Problem Solving: Normal Visual and Spatial: Normal   Family History:  Reviewed  Family Health History Mother, Deceased; Healthy; healthy Father, Deceased; Healthy; healthy    Objective Information: General:  Well appearing, well nourished in no distress. Neck:  Supple without lymphadenopathy.  Heart:  RRR, no murmur Lungs:    CTA bilaterally, no wheezes, rhonchi, rales.  Breathing unlabored. Abdomen:Soft, NT/ND, no HSM, no masses.  Point tenderness over left internal inguinal ring, laxity noted, no specific hernia bulging. ZO:[id number]    Assessment:Left groin pain, probable left inguinal hernia  Diagnosis &amp; Procedure Smart Code   Plan:I told patient that he probably has a left inguinal hernia, though I am not 100% sure.  All other tests and exams negative.  Symptoms and findings c/w left inguinal hernia.  Discussed all options including surgery.  He would like to think about it, which is ok with me.  As pain is improving, the surgery is not urgent.

## 2013-02-11 NOTE — Patient Instructions (Addendum)
Your procedure is scheduled on: 02/17/2013  Report to [facility] at    6:15 AM.  Call this number if you have problems the morning of surgery: [phone]   Remember:   Do not drink or eat food:After Midnight.  :  Take these medicines the morning of surgery with A SIP OF WATER: Protonix   Do not wear jewelry, make-up or nail polish.  Do not wear lotions, powders, or perfumes. You may wear deodorant.  Do not shave 48 hours prior to surgery. Men may shave face and neck.  Do not bring valuables to the hospital.  Contacts, dentures or bridgework may not be worn into surgery.  Leave suitcase in the car. After surgery it may be brought to your room.  For patients admitted to the hospital, checkout time is 11:00 AM the day of discharge.   Patients discharged the day of surgery will not be allowed to drive home.    Special Instructions: Shower using CHG 2 nights before surgery and the night before surgery.  If you shower the day of surgery use CHG.  Use special wash - you have one bottle of CHG for all showers.  You should use approximately 1/3 of the bottle for each shower.   Please read over the following fact sheets that you were given: Pain Booklet, MRSA Information, Surgical Site Infection Prevention and Care and Recovery After Surgery  Hernia Repair Care After These instructions give you information on caring for yourself after your procedure. Your doctor may also give you more specific instructions. Call your doctor if you have any problems or questions after your procedure. HOME CARE   You may have changes in your poops (bowel movements).  You may have loose or watery poop (diarrhea).  You may be not able to poop.  Your bowels will slowly get back to normal.  Do not eat any food that makes you sick to your stomach (nauseous). Eat small meals 4 to 6 times a day instead of 3 large ones.  Do not drink pop. It will give you gas.  Do not drink alcohol.  Do not lift anything  heavier than 10 pounds. This is about the weight of a gallon of milk.  Do not do anything that makes you very tired for at least 6 weeks.  Do not get your wound wet for 2 days.  You may take a sponge bath during this time.  After 2 days you may take a shower. Gently pat your surgical cut (incision) dry with a towel. Do not rub it.  For men: You may have been given an athletic supporter (scrotal support) before you left the hospital. It holds your scrotum and testicles closer to your body so there is no strain on your wound. Wear the supporter until your doctor tells you that you do not need it anymore. GET HELP RIGHT AWAY IF:  You have watery poop, or cannot poop for more than 3 days.  You feel sick to your stomach or throw up (vomit) more than 2 or 3 times.  You have temperature by mouth above 102 F (38.9 C).  You see redness or puffiness (swelling) around your wound.  You see yellowish white fluid (pus) coming from your wound.  You see a bulge or bump in your lower belly (abdomen) or near your groin.  You develop a rash, trouble breathing, or any other symptoms from medicines taken. MAKE SURE YOU:  Understand these instructions.  Will watch your condition.  Will get help right away if your are not doing well or get worse. Document Released: 05/02/2008 Document Revised: 08/12/2011 Document Reviewed: 05/02/2008 [facility] Patient Information 2014 [place]. PATIENT INSTRUCTIONS POST-ANESTHESIA  IMMEDIATELY FOLLOWING SURGERY:  Do not drive or operate machinery for the first twenty four hours after surgery.  Do not make any important decisions for twenty four hours after surgery or while taking narcotic pain medications or sedatives.  If you develop intractable nausea and vomiting or a severe headache please notify your doctor immediately.  FOLLOW-UP:  Please make an appointment with your surgeon as instructed. You do not need to follow up with anesthesia unless  specifically instructed to do so.  WOUND CARE INSTRUCTIONS (if applicable):  Keep a dry clean dressing on the anesthesia/puncture wound site if there is drainage.  Once the wound has quit draining you may leave it open to air.  Generally you should leave the bandage intact for twenty four hours unless there is drainage.  If the epidural site drains for more than 36-48 hours please call the anesthesia department.  QUESTIONS?:  Please feel free to call your physician or the hospital operator if you have any questions, and they will be happy to assist you.

## 2013-02-17 ENCOUNTER — Ambulatory Visit (HOSPITAL_COMMUNITY)
Admission: RE | Admit: 2013-02-17 | Discharge: 2013-02-17 | Disposition: A | Discharge status: Home / Self Care | Payer: Managed Care, Other (non HMO) | Source: Ambulatory Visit | Attending: General Surgery | Admitting: General Surgery

## 2013-02-17 ENCOUNTER — Encounter (HOSPITAL_COMMUNITY): Payer: Self-pay | Admitting: Anesthesiology

## 2013-02-17 ENCOUNTER — Encounter (HOSPITAL_COMMUNITY): Payer: Self-pay | Admitting: *Deleted

## 2013-02-17 ENCOUNTER — Ambulatory Visit (HOSPITAL_COMMUNITY): Payer: Managed Care, Other (non HMO) | Admitting: Anesthesiology

## 2013-02-17 ENCOUNTER — Encounter (HOSPITAL_COMMUNITY)
Admission: RE | Disposition: A | Discharge status: Home / Self Care | Payer: Self-pay | Source: Ambulatory Visit | Attending: General Surgery

## 2013-02-17 DIAGNOSIS — I10 Essential (primary) hypertension: Secondary | ICD-10-CM | POA: Insufficient documentation

## 2013-02-17 DIAGNOSIS — K4090 Unilateral inguinal hernia, without obstruction or gangrene, not specified as recurrent: Secondary | ICD-10-CM | POA: Insufficient documentation

## 2013-02-17 HISTORY — PX: INGUINAL HERNIA REPAIR: SHX194

## 2013-02-17 HISTORY — PX: INSERTION OF MESH: SHX5868

## 2013-02-17 SURGERY — REPAIR, HERNIA, INGUINAL, ADULT
Anesthesia: General | Site: Groin | Laterality: Left | Wound class: Clean

## 2013-02-17 MED ORDER — BUPIVACAINE LIPOSOME 1.3 % IJ SUSP
INTRAMUSCULAR | Status: DC | PRN
  Administered 2013-02-17: 10 mL

## 2013-02-17 MED ORDER — MIDAZOLAM HCL 2 MG/2ML IJ SOLN
INTRAMUSCULAR | Status: AC
  Filled 2013-02-17: qty 2

## 2013-02-17 MED ORDER — KETOROLAC TROMETHAMINE 30 MG/ML IJ SOLN
30.0000 mg | Freq: Once | INTRAMUSCULAR | Status: AC
  Administered 2013-02-17: 30 mg via INTRAVENOUS

## 2013-02-17 MED ORDER — FENTANYL CITRATE 0.05 MG/ML IJ SOLN
INTRAMUSCULAR | Status: AC
  Filled 2013-02-17: qty 5

## 2013-02-17 MED ORDER — BUPIVACAINE HCL (PF) 0.5 % IJ SOLN
INTRAMUSCULAR | Status: AC
  Filled 2013-02-17: qty 30

## 2013-02-17 MED ORDER — FENTANYL CITRATE 0.05 MG/ML IJ SOLN
INTRAMUSCULAR | Status: DC | PRN
  Administered 2013-02-17 (×2): 25 ug via INTRAVENOUS
  Administered 2013-02-17 (×2): 50 ug via INTRAVENOUS

## 2013-02-17 MED ORDER — ONDANSETRON HCL 4 MG/2ML IJ SOLN: 4.0000 mg | Freq: Once | INTRAMUSCULAR | Status: DC | PRN

## 2013-02-17 MED ORDER — FENTANYL CITRATE 0.05 MG/ML IJ SOLN
INTRAMUSCULAR | Status: AC
  Filled 2013-02-17: qty 2

## 2013-02-17 MED ORDER — KETOROLAC TROMETHAMINE 30 MG/ML IJ SOLN
INTRAMUSCULAR | Status: AC
  Filled 2013-02-17: qty 1

## 2013-02-17 MED ORDER — LACTATED RINGERS IV SOLN
INTRAVENOUS | Status: DC
  Administered 2013-02-17: 10:00:00 via INTRAVENOUS

## 2013-02-17 MED ORDER — PROPOFOL 10 MG/ML IV BOLUS
INTRAVENOUS | Status: DC | PRN
  Administered 2013-02-17: 150 mg via INTRAVENOUS

## 2013-02-17 MED ORDER — CEFAZOLIN SODIUM-DEXTROSE 2-3 GM-% IV SOLR
2.0000 g | INTRAVENOUS | Status: AC
  Administered 2013-02-17: 2 g via INTRAVENOUS

## 2013-02-17 MED ORDER — LACTATED RINGERS IV SOLN
INTRAVENOUS | Status: DC
  Administered 2013-02-17: 07:00:00 via INTRAVENOUS

## 2013-02-17 MED ORDER — LIDOCAINE HCL (PF) 1 % IJ SOLN
INTRAMUSCULAR | Status: AC
  Filled 2013-02-17: qty 5

## 2013-02-17 MED ORDER — OXYCODONE-ACETAMINOPHEN 7.5-325 MG PO TABS: 1.0000 | ORAL_TABLET | ORAL | Status: DC | PRN

## 2013-02-17 MED ORDER — CEFAZOLIN SODIUM-DEXTROSE 2-3 GM-% IV SOLR
INTRAVENOUS | Status: AC
  Filled 2013-02-17: qty 50

## 2013-02-17 MED ORDER — LIDOCAINE HCL (CARDIAC) 20 MG/ML IV SOLN
INTRAVENOUS | Status: DC | PRN
  Administered 2013-02-17: 30 mg via INTRAVENOUS

## 2013-02-17 MED ORDER — MIDAZOLAM HCL 2 MG/2ML IJ SOLN
1.0000 mg | INTRAMUSCULAR | Status: DC | PRN
  Administered 2013-02-17: 2 mg via INTRAVENOUS

## 2013-02-17 MED ORDER — FENTANYL CITRATE 0.05 MG/ML IJ SOLN
25.0000 ug | INTRAMUSCULAR | Status: DC | PRN
  Administered 2013-02-17 (×2): 50 ug via INTRAVENOUS
  Administered 2013-02-17: 25 ug via INTRAVENOUS

## 2013-02-17 MED ORDER — 0.9 % SODIUM CHLORIDE (POUR BTL) OPTIME
TOPICAL | Status: DC | PRN
  Administered 2013-02-17: 500 mL

## 2013-02-17 MED ORDER — ONDANSETRON HCL 4 MG/2ML IJ SOLN
4.0000 mg | Freq: Once | INTRAMUSCULAR | Status: AC
  Administered 2013-02-17: 4 mg via INTRAVENOUS

## 2013-02-17 MED ORDER — ONDANSETRON HCL 4 MG/2ML IJ SOLN
INTRAMUSCULAR | Status: AC
  Filled 2013-02-17: qty 2

## 2013-02-17 MED ORDER — PROPOFOL 10 MG/ML IV EMUL
INTRAVENOUS | Status: AC
  Filled 2013-02-17: qty 20

## 2013-02-17 MED ORDER — FENTANYL CITRATE 0.05 MG/ML IJ SOLN
25.0000 ug | INTRAMUSCULAR | Status: AC
Start: 2013-02-17 — End: 2013-02-17
  Administered 2013-02-17: 25 ug via INTRAVENOUS

## 2013-02-17 MED ORDER — BUPIVACAINE LIPOSOME 1.3 % IJ SUSP
20.0000 mL | Freq: Once | INTRAMUSCULAR | Status: DC
  Filled 2013-02-17: qty 20

## 2013-02-17 SURGICAL SUPPLY — 40 items
ADH SKN CLS APL DERMABOND .7 (GAUZE/BANDAGES/DRESSINGS) ×1
BAG HAMPER (MISCELLANEOUS) ×2 IMPLANT
CLOTH BEACON ORANGE TIMEOUT ST (SAFETY) ×2 IMPLANT
COVER LIGHT HANDLE STERIS (MISCELLANEOUS) ×4 IMPLANT
DECANTER SPIKE VIAL GLASS SM (MISCELLANEOUS) ×2 IMPLANT
DERMABOND ADVANCED (GAUZE/BANDAGES/DRESSINGS) ×1
DERMABOND ADVANCED .7 DNX12 (GAUZE/BANDAGES/DRESSINGS) ×1 IMPLANT
DRAIN PENROSE 18X.75 LTX STRL (MISCELLANEOUS) ×2 IMPLANT
ELECT REM PT RETURN 9FT ADLT (ELECTROSURGICAL) ×2
ELECTRODE REM PT RTRN 9FT ADLT (ELECTROSURGICAL) ×1 IMPLANT
FORMALIN 10 PREFIL 120ML (MISCELLANEOUS) IMPLANT
GLOVE BIO SURGEON STRL SZ7.5 (GLOVE) ×2 IMPLANT
GLOVE BIOGEL PI IND STRL 7.0 (GLOVE) IMPLANT
GLOVE BIOGEL PI INDICATOR 7.0 (GLOVE) ×2
GLOVE ECLIPSE 6.5 STRL STRAW (GLOVE) ×2 IMPLANT
GLOVE OPTIFIT SS 6.5 STRL BRWN (GLOVE) ×2 IMPLANT
GOWN STRL REIN XL XLG (GOWN DISPOSABLE) ×6 IMPLANT
INST SET MINOR GENERAL (KITS) ×2 IMPLANT
KIT ROOM TURNOVER APOR (KITS) ×2 IMPLANT
MANIFOLD NEPTUNE II (INSTRUMENTS) ×2 IMPLANT
MESH MARLEX PLUG MEDIUM (Mesh General) ×1 IMPLANT
NDL HYPO 25X1 1.5 SAFETY (NEEDLE) IMPLANT
NEEDLE HYPO 25X1 1.5 SAFETY (NEEDLE) ×2 IMPLANT
NS IRRIG 1000ML POUR BTL (IV SOLUTION) ×2 IMPLANT
PACK MINOR (CUSTOM PROCEDURE TRAY) ×2 IMPLANT
PAD ARMBOARD 7.5X6 YLW CONV (MISCELLANEOUS) ×2 IMPLANT
SET BASIN LINEN APH (SET/KITS/TRAYS/PACK) ×2 IMPLANT
SOL PREP PROV IODINE SCRUB 4OZ (MISCELLANEOUS) ×2 IMPLANT
SUT NOVA NAB GS-22 2 2-0 T-19 (SUTURE) ×2 IMPLANT
SUT NOVAFIL NAB HGS22 2-0 30IN (SUTURE) IMPLANT
SUT SILK 3 0 (SUTURE)
SUT SILK 3-0 18XBRD TIE 12 (SUTURE) IMPLANT
SUT VIC AB 2-0 CT1 27 (SUTURE) ×2
SUT VIC AB 2-0 CT1 TAPERPNT 27 (SUTURE) ×1 IMPLANT
SUT VIC AB 3-0 SH 27 (SUTURE) ×2
SUT VIC AB 3-0 SH 27X BRD (SUTURE) ×1 IMPLANT
SUT VIC AB 4-0 PS2 27 (SUTURE) ×2 IMPLANT
SUT VICRYL AB 3 0 TIES (SUTURE) ×1 IMPLANT
SYR CONTROL 10ML LL (SYRINGE) ×3 IMPLANT
TOWEL OR 17X26 4PK STRL BLUE (TOWEL DISPOSABLE) ×2 IMPLANT

## 2013-02-17 NOTE — Anesthesia Preprocedure Evaluation (Signed)
Anesthesia Evaluation  Patient identified by MRN, date of birth, ID band Patient awake    Reviewed: Allergy & Precautions, H&P , NPO status , Patient's Chart, lab work & pertinent test results  Airway Mallampati: I TM Distance: >3 FB Neck ROM: Full    Dental  (+) Teeth Intact and Implants Implants- all upper incisors :   Pulmonary neg pulmonary ROS,  breath sounds clear to auscultation        Cardiovascular hypertension, Rhythm:Regular Rate:Normal     Neuro/Psych  Headaches, PSYCHIATRIC DISORDERS Anxiety    GI/Hepatic GERD- (inactive now)  Medicated and Controlled,  Endo/Other    Renal/GU      Musculoskeletal   Abdominal   Peds  Hematology   Anesthesia Other Findings   Reproductive/Obstetrics                           Anesthesia Physical Anesthesia Plan  ASA: II  Anesthesia Plan: General   Post-op Pain Management:    Induction: Intravenous  Airway Management Planned: LMA  Additional Equipment:   Intra-op Plan:   Post-operative Plan: Extubation in OR  Informed Consent: I have reviewed the patients History and Physical, chart, labs and discussed the procedure including the risks, benefits and alternatives for the proposed anesthesia with the patient or authorized representative who has indicated his/her understanding and acceptance.     Plan Discussed with:   Anesthesia Plan Comments:         Anesthesia Quick Evaluation

## 2013-02-17 NOTE — Anesthesia Postprocedure Evaluation (Signed)
  Anesthesia Post-op Note  Patient: Dennis Miles  Procedure(s) Performed: Procedure(s): HERNIA REPAIR INGUINAL ADULT (Left) INSERTION OF MESH (Left)  Patient Location: PACU  Anesthesia Type:General  Level of Consciousness: awake, alert  and oriented  Airway and Oxygen Therapy: Patient Spontanous Breathing and Patient connected to face mask oxygen  Post-op Pain: moderate  Post-op Assessment: Post-op Vital signs reviewed, Patient's Cardiovascular Status Stable, Respiratory Function Stable, Patent Airway and No signs of Nausea or vomiting  Post-op Vital Signs: Reviewed and stable 144/89, 57,16, 100, 36.5  Complications: No apparent anesthesia complications

## 2013-02-17 NOTE — Transfer of Care (Signed)
Immediate Anesthesia Transfer of Care Note  Patient: Dennis Miles  Procedure(s) Performed: Procedure(s): HERNIA REPAIR INGUINAL ADULT (Left) INSERTION OF MESH (Left)  Patient Location: PACU  Anesthesia Type:General  Level of Consciousness: awake, alert  and oriented  Airway & Oxygen Therapy: Patient Spontanous Breathing and Patient connected to face mask oxygen  Post-op Assessment: Report given to PACU RN  Post vital signs: Reviewed  Complications: No apparent anesthesia complications

## 2013-02-17 NOTE — Interval H&P Note (Signed)
History and Physical Interval Note:  02/17/2013 7:19 AM  Gardenia Phlegm  has presented today for surgery, with the diagnosis of left inguinal hernia  The various methods of treatment have been discussed with the patient and family. After consideration of risks, benefits and other options for treatment, the patient has consented to  Procedure(s): HERNIA REPAIR INGUINAL ADULT (Left) as a surgical intervention .  The patient's history has been reviewed, patient examined, no change in status, stable for surgery.  I have reviewed the patient's chart and labs.  Questions were answered to the patient's satisfaction.     Franky Macho A

## 2013-02-17 NOTE — Op Note (Signed)
Patient:  Dennis Miles  DOB:  May 18, 1951  MRN:  [id number]   Preop Diagnosis:  Left inguinal hernia  Postop Diagnosis:  Same  Procedure:  Left inguinal herniorrhaphy  Surgeon:  Franky Macho, M.D.  Anes:  General endotracheal  Indications:  Patient is a 62 year old white male who presents with left groin pain. This is felt to be secondary to a left inguinal hernia. The risks and benefits of the procedure including bleeding, infection, recurrence of the pain, and a possibly recurrence of the hernia were fully explained to the patient, who gave informed consent.  Procedure note:  The patient is placed the supine position. After general anesthesia was administered, the left groin region was prepped and draped using usual sterile technique with Betadine. Surgical site confirmation was performed.  A transverse incision was made in the left groin region down to the external oblique aponeuroses. The aponeuroses was incised to the external ring. A Penrose drain was placed around the spermatic cord. The vas deferens was noted to be within the spermatic cord. The ilioinguinal nerve was identified and retracted superiorly from the operative field. 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 A high ligation was performed using a 2-0 Vicryl tie. The patient had a mild direct hernia. This was incised at its base and a medium size PerFix plug was then placed into this region and secured circumferentially to the transversalis fascia using 2-0 Novafil interrupted sutures. An eye patch was then placed along the floor of inguinal canal and secured superiorly to the conjoined tendon and inferiorly to the shelving edge of Poupart's ligament using 2-0 Novafil interrupted sutures. The internal ring was recreated using a 2-0 Novafil interrupted suture. The external oblique aponeuroses was reapproximated using a 2-0 Vicryl running suture. The subcutaneous layer was reapproximated using 3-0 Vicryl interrupted suture. The skin was  closed using a 4 Vicryl subcuticular suture. Expiratory was instilled the surrounding wound. Dermabond was applied.  All tape and needle counts were correct at the end of the procedure. Patient was awakened and transferred to PACU in stable condition.  Complications:  None  EBL:  Minimal  Specimen:  None

## 2013-02-18 ENCOUNTER — Encounter (HOSPITAL_COMMUNITY): Payer: Self-pay | Admitting: General Surgery

## 2013-05-08 ENCOUNTER — Other Ambulatory Visit (INDEPENDENT_AMBULATORY_CARE_PROVIDER_SITE_OTHER): Payer: Self-pay | Admitting: Internal Medicine

## 2013-12-20 ENCOUNTER — Other Ambulatory Visit (INDEPENDENT_AMBULATORY_CARE_PROVIDER_SITE_OTHER): Payer: Self-pay | Admitting: Internal Medicine

## 2013-12-21 ENCOUNTER — Other Ambulatory Visit (INDEPENDENT_AMBULATORY_CARE_PROVIDER_SITE_OTHER): Payer: Self-pay | Admitting: Internal Medicine

## 2013-12-21 DIAGNOSIS — K219 Gastro-esophageal reflux disease without esophagitis: Secondary | ICD-10-CM

## 2013-12-21 MED ORDER — PANTOPRAZOLE SODIUM 40 MG PO TBEC: DELAYED_RELEASE_TABLET | ORAL | Status: DC

## 2014-03-23 ENCOUNTER — Ambulatory Visit (INDEPENDENT_AMBULATORY_CARE_PROVIDER_SITE_OTHER): Payer: Managed Care, Other (non HMO) | Admitting: Internal Medicine

## 2014-03-23 ENCOUNTER — Encounter (INDEPENDENT_AMBULATORY_CARE_PROVIDER_SITE_OTHER): Payer: Self-pay | Admitting: Internal Medicine

## 2014-03-23 VITALS — BP 140/72 | HR 64 | Temp 97.7°F | Ht 68.0 in | Wt 165.0 lb

## 2014-03-23 DIAGNOSIS — K219 Gastro-esophageal reflux disease without esophagitis: Secondary | ICD-10-CM

## 2014-03-23 MED ORDER — PANTOPRAZOLE SODIUM 40 MG PO TBEC: DELAYED_RELEASE_TABLET | ORAL | Status: DC

## 2014-03-23 NOTE — Progress Notes (Signed)
Subjective:    Patient ID: Dennis Miles, male    DOB: Oct 01, 1950, 63 y.o.   MRN: [id number]  HPI Saw Dr. Nyra Capes in [place] and was told his acid was high in his mouth (swab test).  He is getting cavities under his crowns. Patient has crowns on most of his teeth. He had had a lot of dental work.  He thinks he is having acid reflux. He has his bed elevated. He does not have dysphagia. Sometimes he has a sour burp. Appetite is good.  Continues to chew tobacco up to 3 oz a day.      04/07/2011 EGD/ED:  Impression:  No evidence of erosive esophagitis.  Distal esophageal stricture dilated by passing 54 [place] Maloney dilator.  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  Biopsy: No eosinophilic esophagitis.     Review of Systems Past Medical History  Diagnosis Date  . Gout   . High cholesterol   . Hypertension   . GERD (gastroesophageal reflux disease)   . Anxiety   . Headache(784.0)   . Hiatal hernia     Past Surgical History  Procedure Laterality Date  . Sinus sugery    . Appendectomy    . Esophagogastroduodenoscopy  04/17/2011    Procedure: ESOPHAGOGASTRODUODENOSCOPY (EGD);  Surgeon: Rogene Houston, MD;  Location: [facility];  Service: Endoscopy;  Laterality: N/A;  8:30  . Colonoscopy N/A 10/23/2012    Procedure: COLONOSCOPY;  Surgeon: Rogene Houston, MD;  Location: [facility];  Service: Endoscopy;  Laterality: N/A;  235  . Inguinal hernia repair Left 02/17/2013    Procedure: HERNIA REPAIR INGUINAL ADULT;  Surgeon: Jamesetta So, MD;  Location: [facility];  Service: General;  Laterality: Left;  . Insertion of mesh Left 02/17/2013    Procedure: INSERTION OF MESH;  Surgeon: Jamesetta So, MD;  Location: [facility];  Service: General;  Laterality: Left;    Allergies  Allergen Reactions  . Codeine Nausea Only    Current Outpatient Prescriptions on File Prior to Visit  Medication Sig Dispense Refill  . cetirizine (ZYRTEC) 10 MG  tablet Take 10 mg by mouth daily as needed for allergies.      . fluticasone (FLONASE) 50 MCG/ACT nasal spray Place 1 spray into the nose daily as needed. For congestion      . naproxen sodium (ALEVE) 220 MG tablet Take 220 mg by mouth 2 (two) times daily as needed.      . zolpidem (AMBIEN) 10 MG tablet Take 10 mg by mouth at bedtime as needed. For sleep       No current facility-administered medications on file prior to visit.         Objective:   Physical Exam  Filed Vitals:   03/23/14 1131  BP: 140/72  Pulse: 64  Temp: 97.7 F (36.5 C)  Height: 5\' 8"  (1.727 m)  Weight: 165 lb (74.844 kg)   Alert and oriented. Skin warm and dry. Oral mucosa is moist.   . Sclera anicteric, conjunctivae is pink. Thyroid not enlarged. No cervical lymphadenopathy. Lungs clear. Heart regular rate and rhythm.  Abdomen is soft. Bowel sounds are positive. No hepatomegaly. No abdominal masses felt. No tenderness.  No edema to lower extremities.          Assessment & Plan:  Possible GERD with increase in cavities.  Patient does chew tobacco daily which may also be contributing to his cavities. I discussed with Dr. Laural Golden. Protonix  40mg  BID. OV in 8 weeks.

## 2014-03-23 NOTE — Patient Instructions (Signed)
Protonix 40mg  BID. OV in 8 weeks.

## 2014-05-05 ENCOUNTER — Encounter (INDEPENDENT_AMBULATORY_CARE_PROVIDER_SITE_OTHER): Payer: Self-pay | Admitting: *Deleted

## 2014-06-07 ENCOUNTER — Ambulatory Visit (INDEPENDENT_AMBULATORY_CARE_PROVIDER_SITE_OTHER): Payer: Managed Care, Other (non HMO) | Admitting: Internal Medicine

## 2014-06-07 ENCOUNTER — Encounter (INDEPENDENT_AMBULATORY_CARE_PROVIDER_SITE_OTHER): Payer: Self-pay | Admitting: Internal Medicine

## 2014-06-07 VITALS — BP 120/80 | HR 76 | Temp 97.7°F | Ht 68.0 in | Wt 158.4 lb

## 2014-06-07 DIAGNOSIS — K219 Gastro-esophageal reflux disease without esophagitis: Secondary | ICD-10-CM

## 2014-06-07 NOTE — Progress Notes (Signed)
Subjective:    Patient ID: GEN CLAGG, male    DOB: [birth year], 64 y.o.   MRN: [id number]  HPI Here today for f/u of GERD.  He tells me today he is doing okay. If he exerts himself doing something such as straining, he can feels pressure in his esophagus.  He occasionally has burping at night.  He feels okay. No weight loss.  His Protonix was increased to BID. He says his GERD is controlled at this time. Recently saw his Dr. Nyra Capes. and his acid test was normal at that visit (swab test). Appetite is good.  He has recently retired and is staying busy with the grandchildren and working around his home.        04/07/2011 EGD/ED:  Indications: Patient is 64 year-old Caucasian male with chronic GERD maintenance PPI was been experiencing recurrent anus upper sternal area as well as Adam's apple and regurgitation. He is doing better with PPI. He has history of esophageal stricture which was last dilated in may 2002. At the present time he denies dysphagia. Impression:  No evidence of erosive esophagitis.  Distal esophageal stricture dilated by passing 54 [place] Maloney dilator.  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  Biopsy: No eosinophilic esophagitis.       Review of Systems Past Medical History  Diagnosis Date  . Gout   . High cholesterol   . Hypertension   . GERD (gastroesophageal reflux disease)   . Anxiety   . Headache(784.0)   . Hiatal hernia     Past Surgical History  Procedure Laterality Date  . Sinus sugery    . Appendectomy    . Esophagogastroduodenoscopy  04/17/2011    Procedure: ESOPHAGOGASTRODUODENOSCOPY (EGD);  Surgeon: Rogene Houston, MD;  [facility];  Service: Endoscopy;  Laterality: N/A;  8:30  . Colonoscopy N/A 10/23/2012    Procedure: COLONOSCOPY;  Surgeon: Rogene Houston, MD;   [facility];  Service: Endoscopy;  Laterality: N/A;  235  . Inguinal hernia repair Left 02/17/2013    Procedure: HERNIA REPAIR INGUINAL ADULT;  Surgeon: Jamesetta So, MD;  Location: [facility];  Service: General;  Laterality: Left;  . Insertion of mesh Left 02/17/2013    Procedure: INSERTION OF MESH;  Surgeon: Jamesetta So, MD;  Location: [facility];  Service: General;  Laterality: Left;    Allergies  Allergen Reactions  . Codeine Nausea Only    Current Outpatient Prescriptions on File Prior to Visit  Medication Sig Dispense Refill  . fluticasone (FLONASE) 50 MCG/ACT nasal spray Place 1 spray into the nose daily as needed. For congestion    . naproxen sodium (ALEVE) 220 MG tablet Take 220 mg by mouth 2 (two) times daily as needed.    . pantoprazole (PROTONIX) 40 MG tablet One tablet 30 minutes before breakfast and one tablet 30 minutes before supper. (Patient taking differently: 2 (two) times daily. One tablet 30 minutes before breakfast and one tablet 30 minutes before supper.) 60 tablet 5  . zolpidem (AMBIEN) 10 MG tablet Take 10 mg by mouth at bedtime as needed. For sleep     No current facility-administered medications on file prior to visit.        Objective:   Physical Exam  Filed Vitals:   06/07/14 1518  Height: 5\' 8"  (1.727 m)  Weight: 158 lb 6.4 oz (71.85 kg)   Alert and oriented. Skin warm and dry. Oral mucosa is moist.   .  Sclera anicteric, conjunctivae is pink. Thyroid not enlarged. No cervical lymphadenopathy. Lungs clear. Heart regular rate and rhythm.  Abdomen is soft. Bowel sounds are positive. No hepatomegaly. No abdominal masses felt. No tenderness.  No edema to lower extremities.          Assessment & Plan:  GERD which is controlled at this time. Feels much better. OV in 1 year. Continue the Protonix BID.

## 2014-06-07 NOTE — Patient Instructions (Addendum)
OV 1 year. Continue the Protonix

## 2014-09-12 ENCOUNTER — Other Ambulatory Visit (INDEPENDENT_AMBULATORY_CARE_PROVIDER_SITE_OTHER): Payer: Self-pay | Admitting: Otolaryngology

## 2014-09-12 DIAGNOSIS — J321 Chronic frontal sinusitis: Secondary | ICD-10-CM

## 2014-09-19 ENCOUNTER — Ambulatory Visit (HOSPITAL_COMMUNITY)
Admission: RE | Admit: 2014-09-19 | Discharge: 2014-09-19 | Disposition: A | Discharge status: Home / Self Care | Payer: BLUE CROSS/BLUE SHIELD | Source: Ambulatory Visit | Attending: Otolaryngology | Admitting: Otolaryngology

## 2014-09-19 DIAGNOSIS — J321 Chronic frontal sinusitis: Secondary | ICD-10-CM | POA: Diagnosis not present

## 2014-10-20 ENCOUNTER — Ambulatory Visit (INDEPENDENT_AMBULATORY_CARE_PROVIDER_SITE_OTHER): Payer: BLUE CROSS/BLUE SHIELD | Admitting: Otolaryngology

## 2014-10-20 DIAGNOSIS — J342 Deviated nasal septum: Secondary | ICD-10-CM

## 2014-10-20 DIAGNOSIS — J320 Chronic maxillary sinusitis: Secondary | ICD-10-CM

## 2015-02-16 ENCOUNTER — Ambulatory Visit (INDEPENDENT_AMBULATORY_CARE_PROVIDER_SITE_OTHER): Payer: BLUE CROSS/BLUE SHIELD | Admitting: Otolaryngology

## 2015-02-16 DIAGNOSIS — J342 Deviated nasal septum: Secondary | ICD-10-CM | POA: Diagnosis not present

## 2015-02-16 DIAGNOSIS — J343 Hypertrophy of nasal turbinates: Secondary | ICD-10-CM | POA: Diagnosis not present

## 2015-02-24 ENCOUNTER — Encounter (INDEPENDENT_AMBULATORY_CARE_PROVIDER_SITE_OTHER): Payer: Self-pay | Admitting: *Deleted

## 2015-02-25 ENCOUNTER — Other Ambulatory Visit (INDEPENDENT_AMBULATORY_CARE_PROVIDER_SITE_OTHER): Payer: Self-pay | Admitting: Internal Medicine

## 2015-04-10 ENCOUNTER — Other Ambulatory Visit (INDEPENDENT_AMBULATORY_CARE_PROVIDER_SITE_OTHER): Payer: Self-pay | Admitting: Internal Medicine

## 2015-05-08 ENCOUNTER — Other Ambulatory Visit (INDEPENDENT_AMBULATORY_CARE_PROVIDER_SITE_OTHER): Payer: Self-pay | Admitting: Internal Medicine

## 2015-06-13 ENCOUNTER — Encounter (INDEPENDENT_AMBULATORY_CARE_PROVIDER_SITE_OTHER): Payer: Self-pay | Admitting: Internal Medicine

## 2015-06-13 ENCOUNTER — Ambulatory Visit (INDEPENDENT_AMBULATORY_CARE_PROVIDER_SITE_OTHER): Payer: BLUE CROSS/BLUE SHIELD | Admitting: Internal Medicine

## 2015-06-22 ENCOUNTER — Ambulatory Visit (INDEPENDENT_AMBULATORY_CARE_PROVIDER_SITE_OTHER): Payer: BLUE CROSS/BLUE SHIELD | Admitting: Otolaryngology

## 2015-06-27 ENCOUNTER — Encounter (INDEPENDENT_AMBULATORY_CARE_PROVIDER_SITE_OTHER): Payer: Self-pay | Admitting: Internal Medicine

## 2015-06-27 ENCOUNTER — Ambulatory Visit (INDEPENDENT_AMBULATORY_CARE_PROVIDER_SITE_OTHER): Payer: BLUE CROSS/BLUE SHIELD | Admitting: Internal Medicine

## 2015-06-27 VITALS — BP 100/62 | HR 64 | Temp 98.5°F | Ht 71.0 in | Wt 156.6 lb

## 2015-06-27 DIAGNOSIS — K219 Gastro-esophageal reflux disease without esophagitis: Secondary | ICD-10-CM | POA: Diagnosis not present

## 2015-06-27 NOTE — Progress Notes (Signed)
Subjective:    Patient ID: Dennis Miles, male    DOB: September 01, 1950, 65 y.o.   MRN: [id number]  HPI  Here today for f/u of his GERD. Hx of erosive esophagitis. Hx of esophageal stricture and was dilated back in 2012.  Last weight in January of 2016 was 158. Takes Protonix BID for his GERD.  He tells me he is doing good. He says he has had some dental issues in the past month. He says he broke 2 crown and has been repaired. He is going to have an implant.  He has been getting on the treadmill weekly.  Appetite is okay. No dysphagia.    04/07/2011 EGD/ED:   Indications:  Patient is 65 year-old Caucasian male with chronic GERD maintenance PPI was been experiencing recurrent anus upper sternal area as well as Adam's apple and regurgitation. He is doing better with PPI. He has history of esophageal stricture which was last dilated in may 2002. At the present time he denies dysphagia.                                                                                                                     Impression:   No evidence of erosive esophagitis.   Distal esophageal stricture dilated by passing 54 [place] Maloney dilator.   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   Biopsy: No eosinophilic esophagitis.      Review of Systems Past Medical History  Diagnosis Date  . Gout   . High cholesterol   . Hypertension   . GERD (gastroesophageal reflux disease)   . Anxiety   . Headache(784.0)   . Hiatal hernia     Past Surgical History  Procedure Laterality Date  . Sinus sugery    . Appendectomy    . Esophagogastroduodenoscopy  04/17/2011    Procedure: ESOPHAGOGASTRODUODENOSCOPY (EGD);  Surgeon: Rogene Houston, MD;  [facility];  Service: Endoscopy;  Laterality: N/A;  8:30  . Colonoscopy N/A 10/23/2012    Procedure: COLONOSCOPY;  Surgeon: Rogene Houston, MD;  [facility];  Service: Endoscopy;  Laterality: N/A;  235  . Inguinal  hernia repair Left 02/17/2013    Procedure: HERNIA REPAIR INGUINAL ADULT;  Surgeon: Jamesetta So, MD;  Location: [facility];  Service: General;  Laterality: Left;  . Insertion of mesh Left 02/17/2013    Procedure: INSERTION OF MESH;  Surgeon: Jamesetta So, MD;  Location: [facility];  Service: General;  Laterality: Left;    Allergies  Allergen Reactions  . Codeine Nausea Only    Current Outpatient Prescriptions on File Prior to Visit  Medication Sig Dispense Refill  . cetirizine (ZYRTEC) 10 MG tablet Take 10 mg by mouth daily.    . colchicine 0.6 MG tablet Take 0.6 mg by mouth as needed.     . fluticasone (FLONASE) 50 MCG/ACT nasal spray Place 1 spray into the nose daily as needed. For congestion    . naproxen  sodium (ALEVE) 220 MG tablet Take 220 mg by mouth 2 (two) times daily as needed.    . pantoprazole (PROTONIX) 40 MG tablet TAKE ONE TABLET BY MOUTH THIRTY MINUTES BEFORE BREAKFAST AND ONE TAB THIRTY MINUTES BEFORE SUPPER 60 tablet 0  . zolpidem (AMBIEN) 10 MG tablet Take 10 mg by mouth at bedtime as needed. For sleep     No current facility-administered medications on file prior to visit.        Objective:   Physical Exam Blood pressure 100/62, pulse 64, temperature 98.5 F (36.9 C), height 5\' 11"  (1.803 m), weight 156 lb 9.6 oz (71.033 kg).  Alert and oriented. Skin warm and dry. Oral mucosa is moist.   . Sclera anicteric, conjunctivae is pink. Thyroid not enlarged. No cervical lymphadenopathy. Lungs clear. Heart regular rate and rhythm.  Abdomen is soft. Bowel sounds are positive. No hepatomegaly. No abdominal masses felt. No tenderness.  No edema to lower extremities        Assessment & Plan:  GERD controlled with Protonix BID. He is doing well. He will have OV in 1 year.

## 2015-06-27 NOTE — Patient Instructions (Signed)
Continue the Protonix. OV in 1 year.  

## 2015-07-12 ENCOUNTER — Other Ambulatory Visit (HOSPITAL_COMMUNITY): Payer: Self-pay | Admitting: Internal Medicine

## 2015-07-12 DIAGNOSIS — M542 Cervicalgia: Secondary | ICD-10-CM

## 2015-07-18 ENCOUNTER — Ambulatory Visit (HOSPITAL_COMMUNITY)
Admission: RE | Admit: 2015-07-18 | Discharge: 2015-07-18 | Disposition: A | Discharge status: Home / Self Care | Payer: BLUE CROSS/BLUE SHIELD | Source: Ambulatory Visit | Attending: Internal Medicine | Admitting: Internal Medicine

## 2015-07-18 ENCOUNTER — Other Ambulatory Visit (HOSPITAL_COMMUNITY): Payer: Self-pay | Admitting: Internal Medicine

## 2015-07-18 DIAGNOSIS — M542 Cervicalgia: Secondary | ICD-10-CM | POA: Insufficient documentation

## 2015-07-18 DIAGNOSIS — M4802 Spinal stenosis, cervical region: Secondary | ICD-10-CM | POA: Insufficient documentation

## 2015-08-01 ENCOUNTER — Ambulatory Visit (HOSPITAL_COMMUNITY): Payer: BLUE CROSS/BLUE SHIELD | Attending: Physical Medicine and Rehabilitation | Admitting: Occupational Therapy

## 2015-08-01 ENCOUNTER — Encounter (HOSPITAL_COMMUNITY): Payer: Self-pay | Admitting: Occupational Therapy

## 2015-08-01 DIAGNOSIS — R29898 Other symptoms and signs involving the musculoskeletal system: Secondary | ICD-10-CM | POA: Diagnosis present

## 2015-08-01 DIAGNOSIS — R208 Other disturbances of skin sensation: Secondary | ICD-10-CM | POA: Diagnosis present

## 2015-08-01 DIAGNOSIS — M542 Cervicalgia: Secondary | ICD-10-CM | POA: Diagnosis not present

## 2015-08-01 DIAGNOSIS — R200 Anesthesia of skin: Secondary | ICD-10-CM

## 2015-08-01 DIAGNOSIS — M6289 Other specified disorders of muscle: Secondary | ICD-10-CM

## 2015-08-01 DIAGNOSIS — M6281 Muscle weakness (generalized): Secondary | ICD-10-CM | POA: Diagnosis present

## 2015-08-01 DIAGNOSIS — M629 Disorder of muscle, unspecified: Secondary | ICD-10-CM | POA: Diagnosis present

## 2015-08-01 NOTE — Therapy (Addendum)
[place], [place], [address] Phone: [phone]   Fax:  [phone]  Occupational Therapy Evaluation  Patient Details  Name: Dennis Miles MRN: [id number] Date of Birth: 19-Apr-1951 Referring Provider: Dr. Suella Broad  Encounter Date: 08/01/2015      OT End of Session - 08/01/15 1208    Visit Number 1   Number of Visits 8   Date for OT Re-Evaluation 08/31/15   Authorization Type BCBS    Authorization - Visit Number 1   Authorization - Number of Visits 30   OT Start Time [id number]   OT Stop Time 1149   OT Time Calculation (min) 47 min   Activity Tolerance Patient tolerated treatment well   Behavior During Therapy [facility] for tasks assessed/performed      Past Medical History  Diagnosis Date  . Gout   . High cholesterol   . Hypertension   . GERD (gastroesophageal reflux disease)   . Anxiety   . Headache(784.0)   . Hiatal hernia     Past Surgical History  Procedure Laterality Date  . Sinus sugery    . Appendectomy    . Esophagogastroduodenoscopy  04/17/2011    Procedure: ESOPHAGOGASTRODUODENOSCOPY (EGD);  Surgeon: Rogene Houston, MD;  [facility];  Service: Endoscopy;  Laterality: N/A;  8:30  . Colonoscopy N/A 10/23/2012    Procedure: COLONOSCOPY;  Surgeon: Rogene Houston, MD;  [facility];  Service: Endoscopy;  Laterality: N/A;  235  . Inguinal hernia repair Left 02/17/2013    Procedure: HERNIA REPAIR INGUINAL ADULT;  Surgeon: Jamesetta So, MD;  [facility];  Service: General;  Laterality: Left;  . Insertion of mesh Left 02/17/2013    Procedure: INSERTION OF MESH;  Surgeon: Jamesetta So, MD;  Location: [facility];  Service: General;  Laterality: Left;    There were no vitals filed for this visit.  Visit Diagnosis:  Cervical pain (neck)  Numbness of left hand  Tight fascia  Decreased range of motion of neck  Decreased grip strength of left hand      Subjective  Assessment - 08/01/15 1157    Subjective  S: My hand is always numb when I wake up.    Pertinent History Pt is a 65 y/o male presenting with pain, tingling, and numbness along cervical region, left arm, and radiating to left hand. Pt reports symptoms are ranging in intensity and location. Pt was referred to occupational therapy by Dr. Suella Broad for evaluation and treatment.    Special Tests FOTO Score: 52/100 (48% impairment)   Patient Stated Goals To decrease the discomfort in my left neck, arm, and hand   Currently in Pain? No/denies           [facility] OT Assessment - 08/01/15 1108    Assessment   Diagnosis cervical disc disorder; left hand numbness   Referring Provider Dr. Suella Broad   Onset Date 04/02/16   Prior Therapy none   Precautions   Precautions None   Restrictions   Weight Bearing Restrictions No   Balance Screen   Has the patient fallen in the past 6 months No   Has the patient had a decrease in activity level because of a fear of falling?  No   Is the patient reluctant to leave their home because of a fear of falling?  No   Home  Environment   Family/patient expects to be discharged to: Private residence  Prior Function   Level of Independence Independent with basic ADLs   Vocation Retired   Surveyor, minerals, gardening, cutting wood, yardwork   ADL   ADL comments Pt has difficulty with opening jars, carrying weighted objects, using tools, unable to complete overhead activities for long periods of time    Written Expression   Dominant Hand Right   Cognition   Overall Cognitive Status Within Functional Limits for tasks assessed   Sensation   Light Touch Appears Intact  on eval   Stereognosis Appears Intact  on eval   ROM / Strength   AROM / PROM / Strength AROM;Strength   Palpation   Palpation comment Pt with increased tenderness and mod fascial restrictions in cervical region, left trapezius, and scapularis regions.    AROM   Overall AROM Comments All  shoulder/elbow, wrist A/ROM is [facility]   AROM Assessment Site Cervical   Cervical Flexion 52   Cervical Extension 74   Cervical - Right Side Bend 35   Cervical - Left Side Bend 31   Cervical - Right Rotation 66   Cervical - Left Rotation 65   Strength   Overall Strength Comments Assessed seated, ER/IR adducted   Strength Assessment Site Shoulder;Elbow;Forearm;Wrist;Hand   Right/Left Shoulder Left   Left Shoulder Flexion 5/5   Left Shoulder ABduction 5/5   Left Shoulder Internal Rotation 4+/5   Left Shoulder External Rotation 4+/5   Right/Left Elbow Left   Left Elbow Flexion 4+/5   Left Elbow Extension 4+/5       Right/Left Wrist Left   Left Wrist Flexion 4+/5   Left Wrist Extension 4+/5   Right/Left hand Right;Left   Right Hand Gross Grasp Functional   Right Hand Grip (lbs) 67   Right Hand Lateral Pinch 20 lbs   Right Hand 3 Point Pinch 13 lbs   Left Hand Gross Grasp Functional   Left Hand Grip (lbs) 38   Left Hand Lateral Pinch 17 lbs   Left Hand 3 Point Pinch 13 lbs                         OT Education - 08/01/15 1133    Education provided Yes   Education Details cervical A/ROM exercises    Person(s) Educated Patient   Methods Explanation;Demonstration;Handout   Comprehension Verbalized understanding;Returned demonstration          OT Short Term Goals - 08/01/15 1631    OT SHORT TERM GOAL #1   Title Pt will be educated and independent in HEP.    Time 4   Period Weeks   Status New   OT SHORT TERM GOAL #2   Title Pt will return to prior level of functioning and independence in daily and leisure tasks.    Time 4   Period Weeks   Status New   OT SHORT TERM GOAL #3   Title Pt will increase left grip strength by 10# to increase ability to open jar lids and operate.    Time 4   Period Weeks   Status New   OT SHORT TERM GOAL #4   Title Pt will decrease fascial restrictions in LUE and cervical region from mod to min amounts or less to increase  mobility in cervical region and LUE.    Time 4   Period Weeks   Status New   OT SHORT TERM GOAL #5   Title Pt will increase cervical A/ROM to WNL to increase ability  to looking left/right when driving and during daily tasks.    Time 4   Period Weeks   Status New   Additional Short Term Goals   Additional Short Term Goals Yes   OT SHORT TERM GOAL #6   Title Pt will decrease pain in cervical region and LUE to 2/10 or less during daily and leisure tasks.    Time 4   Period Weeks   Status New                  Plan - 08/01/15 1208    Clinical Impression Statement A: Pt is a 66 y/o male presenting with increased pain along cervical region radiating to left arm and hand, increased numbness in left hand, decreased grip strength of left hand, decreased range of motion of cervical region, limiting ability to complete daily tasks at highest level of functioning. Pt reports he completes a lot of physical labor that has become increasingly difficult over the last 5 months. Provided pt with cervical A/ROM exercises for HEP.    Pt will benefit from skilled therapeutic intervention in order to improve on the following deficits (Retired) Decreased strength;Pain;Impaired sensation;Impaired UE functional use;Decreased activity tolerance;Decreased range of motion;Increased fascial restricitons;Impaired flexibility   Rehab Potential Good   OT Frequency 2x / week   OT Duration 4 weeks   OT Treatment/Interventions Self-care/ADL training;Passive range of motion;Patient/family education;Cryotherapy;Electrical Stimulation;Moist Heat;Therapeutic exercise;Manual Therapy;Therapeutic activities   Plan P: Pt will benefit from skilled occupational therapy services to decrease pain and fascial restrictions, increase range of motion of cervical region, increase left grip strength, and improve overall functioning during ADL tasks. Treatment plan: myofascial release, manual stretching, cervical A/ROM, cervical  stretching, tendon/nerve gliding exercises, left grip strengthening, overall LUE strengthening    OT Home Exercise Plan cervical A/ROM exercises   Consulted and Agree with Plan of Care Patient        Problem List Patient Active Problem List   Diagnosis Date Noted  . Abdominal pain, left lower quadrant 10/19/2012  . GERD (gastroesophageal reflux disease) 04/15/2012  . Gout 04/01/2011  . Hypertension 04/01/2011  . Elevated cholesterol with high triglycerides 04/01/2011    Guadelupe Sabin, OTR/L  [phone]  08/01/2015, 4:39 PM  [place] [address] [place], [place], [address] Phone: [phone]   Fax:  [phone]  Name: Dennis Miles MRN: [id number] Date of Birth: 07-26-1950

## 2015-08-01 NOTE — Patient Instructions (Signed)
  AROM: Lateral Neck Flexion   Slowly tilt head toward one shoulder, then the other.  Repeat _10-15___ times per set. Do _1___ sets per session. Do __1-2__ sessions per day.  http://orth.exer.us/296   Copyright  VHI. All rights reserved.  AROM: Neck Extension   Bend head backward. Repeat _10-15___ times per set. Do __1__ sets per session. Do __1-2__ sessions per day.  http://orth.exer.us/300   Copyright  VHI. All rights reserved.  AROM: Neck Flexion   Bend head forward.  Repeat _10-15___ times per set. Do __1__ sets per session. Do _1-2___ sessions per day.  http://orth.exer.us/298   Copyright  VHI. All rights reserved.  AROM: Neck Rotation   Turn head slowly to look over one shoulder, then the other.  Repeat _10-15___ times per set. Do __1__ sets per session. Do _1-2___ sessions per day.  http://orth.exer.us/294   Copyright  VHI. All rights reserved.

## 2015-08-03 ENCOUNTER — Ambulatory Visit (HOSPITAL_COMMUNITY): Payer: BLUE CROSS/BLUE SHIELD | Attending: Physical Medicine and Rehabilitation

## 2015-08-03 ENCOUNTER — Encounter (HOSPITAL_COMMUNITY): Payer: Self-pay

## 2015-08-03 DIAGNOSIS — R29898 Other symptoms and signs involving the musculoskeletal system: Secondary | ICD-10-CM | POA: Insufficient documentation

## 2015-08-03 DIAGNOSIS — M542 Cervicalgia: Secondary | ICD-10-CM

## 2015-08-03 DIAGNOSIS — M6281 Muscle weakness (generalized): Secondary | ICD-10-CM | POA: Insufficient documentation

## 2015-08-03 DIAGNOSIS — M629 Disorder of muscle, unspecified: Secondary | ICD-10-CM | POA: Diagnosis present

## 2015-08-03 DIAGNOSIS — M6289 Other specified disorders of muscle: Secondary | ICD-10-CM

## 2015-08-03 NOTE — Therapy (Addendum)
[place], [place], [address] Phone: [phone]   Fax:  [phone]  Occupational Therapy Treatment  Patient Details  Name: Dennis Miles MRN: [id number] Date of Birth: 31-Aug-1950 Referring Provider: Dr. Suella Broad  Encounter Date: 08/03/2015      OT End of Session - 08/03/15 0914    Visit Number 2   Number of Visits 8   Date for OT Re-Evaluation 08/31/15   Authorization Type BCBS    Authorization - Visit Number 2   Authorization - Number of Visits 30   OT Start Time 0805   OT Stop Time [phone]   OT Time Calculation (min) 47 min   Activity Tolerance Patient tolerated treatment well   Behavior During Therapy [facility] for tasks assessed/performed      Past Medical History  Diagnosis Date  . Gout   . High cholesterol   . Hypertension   . GERD (gastroesophageal reflux disease)   . Anxiety   . Headache(784.0)   . Hiatal hernia     Past Surgical History  Procedure Laterality Date  . Sinus sugery    . Appendectomy    . Esophagogastroduodenoscopy  04/17/2011    Procedure: ESOPHAGOGASTRODUODENOSCOPY (EGD);  Surgeon: Rogene Houston, MD;  [facility];  Service: Endoscopy;  Laterality: N/A;  8:30  . Colonoscopy N/A 10/23/2012    Procedure: COLONOSCOPY;  Surgeon: Rogene Houston, MD;  [facility];  Service: Endoscopy;  Laterality: N/A;  235  . Inguinal hernia repair Left 02/17/2013    Procedure: HERNIA REPAIR INGUINAL ADULT;  Surgeon: Jamesetta So, MD;  Location: [facility];  Service: General;  Laterality: Left;  . Insertion of mesh Left 02/17/2013    Procedure: INSERTION OF MESH;  Surgeon: Jamesetta So, MD;  Location: [facility];  Service: General;  Laterality: Left;    There were no vitals filed for this visit.  Visit Diagnosis:  Cervical pain (neck)  Decreased range of motion of neck  Tight fascia      Subjective Assessment - 08/03/15 0913    Subjective  S: How do I get rid of the  knots?   Currently in Pain? Yes   Pain Score 8    Pain Location Scapula   Pain Orientation Left   Pain Descriptors / Indicators Tightness   Pain Type Acute pain   Pain Radiating Towards Neck down arm   Pain Onset More than a month ago   Pain Frequency Intermittent   Aggravating Factors  Stretches   Pain Relieving Factors Rest   Effect of Pain on Daily Activities None   Multiple Pain Sites No            [facility] OT Assessment - 08/03/15 0826    Assessment   Diagnosis cervical disc disorder; left hand numbness   Precautions   Precautions None                  OT Treatments/Exercises (OP) - 08/03/15 0824    Exercises   Exercises Shoulder;Neck   Neck Exercises: Stretches   Upper Trapezius Stretch 3 reps;10 seconds   Neck Exercises: Standing   Other Standing Exercises Serratus anterior punch; 10X   Neck Exercises: Seated   Shoulder Shrugs 10 reps   Shoulder Flexion 10 reps   Shoulder Exercises: ROM/Strengthening   Prot/Ret//Elev/Dep 1'   Modalities   Modalities Electrical Stimulation;Moist Heat   Moist Heat Therapy   Number Minutes Moist Heat  10 Minutes   Moist Heat Location Shoulder   Electrical Stimulation   Electrical Stimulation Location left upper trapezius   Electrical Stimulation Action interferential   Electrical Stimulation Parameters 21 CV   Electrical Stimulation Goals Pain;Other (comment)  tightness   Manual Therapy   Manual Therapy Myofascial release;Manual Traction   Manual therapy comments Manual therapy completed prior to therapy exercises.   Myofascial Release Myofascial release and manual stretching completed to left upper trapezius and scapularis region and left cervical region to decrease fascial restrictions and increase joint mobility in a pain free zone.    Manual Traction Gentle manual cervical traction completed this session.                 OT Education - 08/03/15 0830    Education provided Yes   Education Details Pt was  given print out of OT evaluation and reviewed plan of care and therapy goals. Education given regarding muscle knots.   Person(s) Educated Patient   Methods Explanation;Handout   Comprehension Verbalized understanding          OT Short Term Goals - 08/03/15 0929    OT SHORT TERM GOAL #1   Title Pt will be educated and independent in HEP.    Time 4   Period Weeks   Status On-going   OT SHORT TERM GOAL #2   Title Pt will return to prior level of functioning and independence in daily and leisure tasks.    Time 4   Period Weeks   Status On-going   OT SHORT TERM GOAL #3   Title Pt will increase left grip strength by 10# to increase ability to open jar lids and operate.    Time 4   Period Weeks   Status On-going   OT SHORT TERM GOAL #4   Title Pt will decrease fascial restrictions in LUE and cervical region from mod to min amounts or less to increase mobility in cervical region and LUE.    Time 4   Period Weeks   Status On-going   OT SHORT TERM GOAL #5   Title Pt will increase cervical A/ROM to WNL to increase ability to looking left/right when driving and during daily tasks.    Time 4   Period Weeks   Status On-going   OT SHORT TERM GOAL #6   Title Pt will decrease pain in cervical region and LUE to 2/10 or less during daily and leisure tasks.    Time 4   Period Weeks   Status On-going                  Plan - 08/03/15 0914    Clinical Impression Statement A: Initiatied myofascial release and manual cervical stretching. Completed scapular mobility exercises. Patient with limited scapular retraction this session stating that it feels tight in his neck when completing. Min VC for form and technique.   Plan P: Complete grip strengthening if time allows. Follow up on cervical pain and tightness. Prone cervical  A/ROM exercises        Problem List Patient Active Problem List   Diagnosis Date Noted  . Abdominal pain, left lower quadrant 10/19/2012  . GERD  (gastroesophageal reflux disease) 04/15/2012  . Gout 04/01/2011  . Hypertension 04/01/2011  . Elevated cholesterol with high triglycerides 04/01/2011    [name], OTR/L,CBIS  [phone]  08/03/2015, 11:32 AM  [place], [place], [address] Phone: [phone]  Fax:  [phone]  Name: Dennis Miles MRN: [id number] Date of Birth: Sep 10, 1950

## 2015-08-08 ENCOUNTER — Encounter (HOSPITAL_COMMUNITY): Payer: Self-pay

## 2015-08-08 ENCOUNTER — Ambulatory Visit (HOSPITAL_COMMUNITY): Payer: BLUE CROSS/BLUE SHIELD

## 2015-08-08 DIAGNOSIS — M629 Disorder of muscle, unspecified: Secondary | ICD-10-CM

## 2015-08-08 DIAGNOSIS — M542 Cervicalgia: Secondary | ICD-10-CM | POA: Diagnosis not present

## 2015-08-08 DIAGNOSIS — R29898 Other symptoms and signs involving the musculoskeletal system: Secondary | ICD-10-CM

## 2015-08-08 DIAGNOSIS — M6289 Other specified disorders of muscle: Secondary | ICD-10-CM

## 2015-08-08 NOTE — Therapy (Addendum)
[place], [place], [address] Phone: [phone]   Fax:  [phone]  Occupational Therapy Treatment  Patient Details  Name: Dennis Miles MRN: [id number] Date of Birth: Jan 24, 1951 Referring Provider: Dr. Suella Broad  Encounter Date: 08/08/2015      OT End of Session - 08/08/15 1418    Visit Number 3   Number of Visits 8   Date for OT Re-Evaluation 08/31/15   Authorization Type BCBS    Authorization - Visit Number 3   Authorization - Number of Visits 30   OT Start Time 1300   OT Stop Time 1345   OT Time Calculation (min) 45 min   Activity Tolerance Patient tolerated treatment well   Behavior During Therapy [facility] for tasks assessed/performed      Past Medical History  Diagnosis Date  . Gout   . High cholesterol   . Hypertension   . GERD (gastroesophageal reflux disease)   . Anxiety   . Headache(784.0)   . Hiatal hernia     Past Surgical History  Procedure Laterality Date  . Sinus sugery    . Appendectomy    . Esophagogastroduodenoscopy  04/17/2011    Procedure: ESOPHAGOGASTRODUODENOSCOPY (EGD);  Surgeon: Rogene Houston, MD;  [facility];  Service: Endoscopy;  Laterality: N/A;  8:30  . Colonoscopy N/A 10/23/2012    Procedure: COLONOSCOPY;  Surgeon: Rogene Houston, MD;  [facility];  Service: Endoscopy;  Laterality: N/A;  235  . Inguinal hernia repair Left 02/17/2013    Procedure: HERNIA REPAIR INGUINAL ADULT;  Surgeon: Jamesetta So, MD;  Location: [facility];  Service: General;  Laterality: Left;  . Insertion of mesh Left 02/17/2013    Procedure: INSERTION OF MESH;  Surgeon: Jamesetta So, MD;  Location: [facility] OR[facility];  Service: General;  Laterality: Left;    There were no vitals filed for this visit.  Visit Diagnosis:  Cervical pain (neck)  Tight fascia  Decreased grip strength of left hand      Subjective Assessment - 08/08/15 1323    Subjective  S: It almost felt a  little worse after last session but then it felt better.    Currently in Pain? Yes   Pain Score 5    Pain Location Neck   Pain Orientation Posterior   Pain Descriptors / Indicators Aching   Pain Type Acute pain   Pain Radiating Towards Neck down left arm   Pain Onset More than a month ago   Pain Frequency Intermittent   Aggravating Factors  Stretches and turning head   Pain Relieving Factors Rest   Effect of Pain on Daily Activities None            [facility] OT Assessment - 08/08/15 1327    Assessment   Diagnosis cervical disc disorder; left hand numbness   Precautions   Precautions None   Strength   Right Hand Grip (lbs) 65   Right Hand Lateral Pinch 22 lbs   Right Hand 3 Point Pinch 20 lbs   Left Hand Grip (lbs) 55   Left Hand Lateral Pinch 24 lbs   Left Hand 3 Point Pinch 18 lbs                  OT Treatments/Exercises (OP) - 08/08/15 1327    Exercises   Exercises Shoulder;Neck   Neck Exercises: Supine   Cervical Rotation Both;10 reps  A/ROM and P/ROM  Neck Exercises: Prone   Axial Exentsion 10 reps   W Back 10 reps   Shoulder Extension 10 reps   Other Prone Exercise Shoulder flexion/hughston exercise; 10X   Manual Therapy   Manual Therapy Myofascial release;Manual Traction   Manual therapy comments Manual therapy completed prior to therapy exercises.   Myofascial Release Myofascial release and manual stretching completed to left upper trapezius and scapularis region and left cervical region to decrease fascial restrictions and increase joint mobility in a pain free zone.    Manual Traction Gentle manual cervical traction completed this session.                   OT Short Term Goals - 08/03/15 0929    OT SHORT TERM GOAL #1   Title Pt will be educated and independent in HEP.    Time 4   Period Weeks   Status On-going   OT SHORT TERM GOAL #2   Title Pt will return to prior level of functioning and independence in daily and leisure tasks.     Time 4   Period Weeks   Status On-going   OT SHORT TERM GOAL #3   Title Pt will increase left grip strength by 10# to increase ability to open jar lids and operate.    Time 4   Period Weeks   Status On-going   OT SHORT TERM GOAL #4   Title Pt will decrease fascial restrictions in LUE and cervical region from mod to min amounts or less to increase mobility in cervical region and LUE.    Time 4   Period Weeks   Status On-going   OT SHORT TERM GOAL #5   Title Pt will increase cervical A/ROM to WNL to increase ability to looking left/right when driving and during daily tasks.    Time 4   Period Weeks   Status On-going   OT SHORT TERM GOAL #6   Title Pt will decrease pain in cervical region and LUE to 2/10 or less during daily and leisure tasks.    Time 4   Period Weeks   Status On-going                  Plan - 08/08/15 1420    Clinical Impression Statement A: Added prone cervical stretches and completed grip strengthening with green putty. Putty added to HEP.   Plan P: Follow up on theraputty use at home. At grip strengthening at table.        Problem List Patient Active Problem List   Diagnosis Date Noted  . Abdominal pain, left lower quadrant 10/19/2012  . GERD (gastroesophageal reflux disease) 04/15/2012  . Gout 04/01/2011  . Hypertension 04/01/2011  . Elevated cholesterol with high triglycerides 04/01/2011   [name], OTR/L,CBIS  [phone]  08/08/2015, 4:27 PM  [place] [address] [place], [place], [address] Phone: [phone]   Fax:  [phone]  Name: Dennis Miles MRN: [id number] Date of Birth: [birth year]

## 2015-08-08 NOTE — Patient Instructions (Signed)
Home Exercises Program Theraputty Exercises  Do the following exercises 2-3 times a day using your affected hand.  1. Roll putty into a ball.  2. Make into a pancake.  3. Roll putty into a roll.  4. Pinch along log with first finger and thumb.   5. Make into a ball.  6. Roll it back into a log.   7. Pinch using thumb and side of first finger.  8. Roll into a ball, then flatten into a pancake.  9. Using your fingers, make putty into a mountain. 

## 2015-08-10 ENCOUNTER — Ambulatory Visit (HOSPITAL_COMMUNITY): Payer: BLUE CROSS/BLUE SHIELD

## 2015-08-10 ENCOUNTER — Encounter (HOSPITAL_COMMUNITY): Payer: Self-pay

## 2015-08-10 DIAGNOSIS — M629 Disorder of muscle, unspecified: Secondary | ICD-10-CM

## 2015-08-10 DIAGNOSIS — M542 Cervicalgia: Secondary | ICD-10-CM | POA: Diagnosis not present

## 2015-08-10 DIAGNOSIS — M6289 Other specified disorders of muscle: Secondary | ICD-10-CM

## 2015-08-10 DIAGNOSIS — R29898 Other symptoms and signs involving the musculoskeletal system: Secondary | ICD-10-CM

## 2015-08-10 NOTE — Therapy (Addendum)
[place], [place], [address] Phone: [phone]   Fax:  [phone]  Occupational Therapy Treatment  Patient Details  Name: JEROMIAH COPELIN MRN: [id number] Date of Birth: [birth year] Referring Provider: Dr. Suella Broad  Encounter Date: 08/10/2015      OT End of Session - 08/10/15 0941    Visit Number 4   Number of Visits 8   Date for OT Re-Evaluation 08/31/15   Authorization Type BCBS    Authorization - Visit Number 4   Authorization - Number of Visits 30   OT Start Time 0850   OT Stop Time 0930   OT Time Calculation (min) 40 min   Activity Tolerance Patient tolerated treatment well   Behavior During Therapy [facility] for tasks assessed/performed      Past Medical History  Diagnosis Date  . Gout   . High cholesterol   . Hypertension   . GERD (gastroesophageal reflux disease)   . Anxiety   . Headache(784.0)   . Hiatal hernia     Past Surgical History  Procedure Laterality Date  . Sinus sugery    . Appendectomy    . Esophagogastroduodenoscopy  04/17/2011    Procedure: ESOPHAGOGASTRODUODENOSCOPY (EGD);  Surgeon: Rogene Houston, MD;  [facility];  Service: Endoscopy;  Laterality: N/A;  8:30  . Colonoscopy N/A 10/23/2012    Procedure: COLONOSCOPY;  Surgeon: Rogene Houston, MD;  [facility];  Service: Endoscopy;  Laterality: N/A;  235  . Inguinal hernia repair Left 02/17/2013    Procedure: HERNIA REPAIR INGUINAL ADULT;  Surgeon: Jamesetta So, MD;  Location: [facility];  Service: General;  Laterality: Left;  . Insertion of mesh Left 02/17/2013    Procedure: INSERTION OF MESH;  Surgeon: Jamesetta So, MD;  Location: [facility];  Service: General;  Laterality: Left;    There were no vitals filed for this visit.  Visit Diagnosis:  Tight fascia  Cervical pain (neck)  Decreased grip strength of left hand  Decreased range of motion of neck   08/10/15 1058  Assessment  Diagnosis  cervical disc disorder, left hand numbness  Referring Provider Dr. Suella Broad  Precautions  Precautions None       Subjective Assessment - 08/10/15 0920    Subjective  S: I didn't do any stretches this morning.   Currently in Pain? Yes   Pain Score 4    Pain Location Neck   Pain Orientation Posterior   Pain Descriptors / Indicators Aching   Pain Type Acute pain                      OT Treatments/Exercises (OP) - 08/10/15 0938    Exercises   Exercises Shoulder;Hand;Neck   Neck Exercises: Stretches   Upper Trapezius Stretch 2 reps;20 seconds   Other Neck Stretches Rhomboid/mid trapezius stretch; 20" hold 2 times   Other Neck Stretches Pectoralis stretch with wall; 10 times; 2" hold    Neck Exercises: Supine   Cervical Rotation Both;5 reps  P/ROM   Lateral Flexion Both;5 reps  P/ROM   Hand Exercises   Hand Gripper with Large Beads all beads with gripper set at 42#   Hand Gripper with Medium Beads all beads with gripper set at 42#   Hand Gripper with Small Beads all beads with gripper set at 42#   Manual Therapy   Manual Therapy Myofascial release;Manual Traction   Manual therapy comments Manual  therapy completed prior to therapy exercises.   Myofascial Release Myofascial release and manual stretching completed to left upper trapezius and scapularis region and left cervical region to decrease fascial restrictions and increase joint mobility in a pain free zone.    Manual Traction Gentle manual cervical traction completed this session.                 OT Education - 08/10/15 0940    Education provided Yes   Education Details Additional cervical and trapeziu stretches: upper and middle trapezius stretch. pectoralis stretch   Person(s) Educated Patient   Methods Explanation;Handout;Demonstration;Verbal cues   Comprehension Verbalized understanding;Returned demonstration          OT Short Term Goals - 08/03/15 0929    OT SHORT TERM GOAL #1    Title Pt will be educated and independent in HEP.    Time 4   Period Weeks   Status On-going   OT SHORT TERM GOAL #2   Title Pt will return to prior level of functioning and independence in daily and leisure tasks.    Time 4   Period Weeks   Status On-going   OT SHORT TERM GOAL #3   Title Pt will increase left grip strength by 10# to increase ability to open jar lids and operate.    Time 4   Period Weeks   Status On-going   OT SHORT TERM GOAL #4   Title Pt will decrease fascial restrictions in LUE and cervical region from mod to min amounts or less to increase mobility in cervical region and LUE.    Time 4   Period Weeks   Status On-going   OT SHORT TERM GOAL #5   Title Pt will increase cervical A/ROM to WNL to increase ability to looking left/right when driving and during daily tasks.    Time 4   Period Weeks   Status On-going   OT SHORT TERM GOAL #6   Title Pt will decrease pain in cervical region and LUE to 2/10 or less during daily and leisure tasks.    Time 4   Period Weeks   Status On-going                  Plan - 08/10/15 0941    Clinical Impression Statement A: Completed gripper activity to increase grip strength. pt reports decreased strength and required rest breaks frequently during activity.    Plan P: Follow up on additional cervical stretches that were given. Continue with grip strengthening.         Problem List Patient Active Problem List   Diagnosis Date Noted  . Abdominal pain, left lower quadrant 10/19/2012  . GERD (gastroesophageal reflux disease) 04/15/2012  . Gout 04/01/2011  . Hypertension 04/01/2011  . Elevated cholesterol with high triglycerides 04/01/2011    [name], OTR/L,CBIS  [phone]  08/10/2015, 10:01 AM  [place] [address] [place], [place], [address] Phone: [phone]   Fax:  [phone]  Name: EARLAND SHALABY MRN: [id number] Date of Birth:  [birth year]

## 2015-08-10 NOTE — Patient Instructions (Signed)
Complete this stretches periodically during the day.  UPPER TRAP STRETCH - HAND BEHIND BACK  Place your arm behind your back. Next, tilt your head to the side. Hold for a stretch. Return to original position and then repeat.     Hold 20 seconds. Repeat 2 times.  RHOMBOID AND MIDDLE TRAP STRETCH - CLASPED HAND  Interlace your fingers and then draw your hands forwards until a stretch is felt along your upper back.   NOTE: You can vary the angle of your arms downward to stretch different muscle fibers along your back. Hold 20 seconds. Repeat 2 times.   Dynamic UE Nerve Glide  Stand facing a wall and place your palm flat on the wall. While keeping your hand on the wall, slowly turn your trunk away from the wall until a gentle pulling is felt down your arm, then slowly turn back towards the wall to relax.  Complete 10 times holding for 2 seconds at end stretch.

## 2015-08-14 ENCOUNTER — Telehealth (HOSPITAL_COMMUNITY): Payer: Self-pay

## 2015-08-14 NOTE — Telephone Encounter (Signed)
Need later appt

## 2015-08-15 ENCOUNTER — Ambulatory Visit (HOSPITAL_COMMUNITY): Payer: BLUE CROSS/BLUE SHIELD

## 2015-08-15 ENCOUNTER — Encounter (HOSPITAL_COMMUNITY): Payer: Self-pay

## 2015-08-15 DIAGNOSIS — M6289 Other specified disorders of muscle: Secondary | ICD-10-CM

## 2015-08-15 DIAGNOSIS — M542 Cervicalgia: Secondary | ICD-10-CM

## 2015-08-15 DIAGNOSIS — M629 Disorder of muscle, unspecified: Secondary | ICD-10-CM

## 2015-08-15 DIAGNOSIS — R29898 Other symptoms and signs involving the musculoskeletal system: Secondary | ICD-10-CM

## 2015-08-15 NOTE — Therapy (Addendum)
[name], [place], [address] Phone: [phone]   Fax:  [phone]  Occupational Therapy Treatment  Patient Details  Name: Dennis Miles MRN: [id number] Date of Birth: February 05, 1951 Referring Provider: Dr. Suella Broad  Encounter Date: 08/15/2015      OT End of Session - 08/15/15 0938    Visit Number 5   Number of Visits 8   Date for OT Re-Evaluation 08/31/15   Authorization Type BCBS    Authorization - Visit Number 5   Authorization - Number of Visits 30   OT Start Time 0850   OT Stop Time 0930   OT Time Calculation (min) 40 min   Activity Tolerance Patient tolerated treatment well   Behavior During Therapy [facility] for tasks assessed/performed      Past Medical History  Diagnosis Date  . Gout   . High cholesterol   . Hypertension   . GERD (gastroesophageal reflux disease)   . Anxiety   . Headache(784.0)   . Hiatal hernia     Past Surgical History  Procedure Laterality Date  . Sinus sugery    . Appendectomy    . Esophagogastroduodenoscopy  04/17/2011    Procedure: ESOPHAGOGASTRODUODENOSCOPY (EGD);  Surgeon: Rogene Houston, MD;  [facility];  Service: Endoscopy;  Laterality: N/A;  8:30  . Colonoscopy N/A 10/23/2012    Procedure: COLONOSCOPY;  Surgeon: Rogene Houston, MD;  [facility];  Service: Endoscopy;  Laterality: N/A;  235  . Inguinal hernia repair Left 02/17/2013    Procedure: HERNIA REPAIR INGUINAL ADULT;  Surgeon: Jamesetta So, MD;  Location: [facility];  Service: General;  Laterality: Left;  . Insertion of mesh Left 02/17/2013    Procedure: INSERTION OF MESH;  Surgeon: Jamesetta So, MD;  Location: [facility];  Service: General;  Laterality: Left;    There were no vitals filed for this visit.  Visit Diagnosis:  Tight fascia  Cervical pain (neck)  Decreased grip strength of left hand      Subjective Assessment - 08/15/15 0913    Subjective  S: It feels numb today.     Currently in Pain? Yes   Pain Score 5    Pain Location Neck   Pain Orientation Left   Pain Descriptors / Indicators Aching   Pain Type Acute pain   Pain Radiating Towards neck down left arm   Pain Onset More than a month ago   Pain Frequency Intermittent   Aggravating Factors  stretches and turning head when driving   Pain Relieving Factors rest   Effect of Pain on Daily Activities none   Multiple Pain Sites No            [facility] OT Assessment - 08/15/15 0937    Assessment   Diagnosis cervical disc disorder; left hand numbness   Precautions   Precautions None                  OT Treatments/Exercises (OP) - 08/15/15 0911    Exercises   Exercises Shoulder;Hand;Neck   Neck Exercises: Supine   Cervical Rotation Both;5 reps  P/ROM, A/ROM   Neck Exercises: Prone   Axial Exentsion 10 reps   Shoulder Exercises: Prone   Other Prone Exercises Scapular raises; 10X   Hand Exercises   Theraputty - Roll green   Theraputty - Grip green   Manual Therapy   Manual Therapy Myofascial release;Manual Traction   Manual therapy comments  Manual therapy completed prior to therapy exercises.   Myofascial Release Myofascial release and manual stretching completed to left upper trapezius and scapularis region and left cervical region to decrease fascial restrictions and increase joint mobility in a pain free zone.    Manual Traction Gentle manual cervical traction completed this session.                   OT Short Term Goals - 08/03/15 0929    OT SHORT TERM GOAL #1   Title Pt will be educated and independent in HEP.    Time 4   Period Weeks   Status On-going   OT SHORT TERM GOAL #2   Title Pt will return to prior level of functioning and independence in daily and leisure tasks.    Time 4   Period Weeks   Status On-going   OT SHORT TERM GOAL #3   Title Pt will increase left grip strength by 10# to increase ability to open jar lids and operate.    Time 4   Period  Weeks   Status On-going   OT SHORT TERM GOAL #4   Title Pt will decrease fascial restrictions in LUE and cervical region from mod to min amounts or less to increase mobility in cervical region and LUE.    Time 4   Period Weeks   Status On-going   OT SHORT TERM GOAL #5   Title Pt will increase cervical A/ROM to WNL to increase ability to looking left/right when driving and during daily tasks.    Time 4   Period Weeks   Status On-going   OT SHORT TERM GOAL #6   Title Pt will decrease pain in cervical region and LUE to 2/10 or less during daily and leisure tasks.    Time 4   Period Weeks   Status On-going                  Plan - 08/15/15 [id number]    Clinical Impression Statement A: Pt reports that he sees the MD for his carpel tunnel on 08/17/15. Left upper trapezius region with significantly less tightness and trigger points. Right upper trapezius with increased tightness this session.    Plan P: Follow up on MD appointment for carpel tunnel. continue with manual therapy to cervical region and grip strengthening.         Problem List Patient Active Problem List   Diagnosis Date Noted  . Abdominal pain, left lower quadrant 10/19/2012  . GERD (gastroesophageal reflux disease) 04/15/2012  . Gout 04/01/2011  . Hypertension 04/01/2011  . Elevated cholesterol with high triglycerides 04/01/2011    [name], OTR/L,CBIS  [phone]  08/15/2015, 9:42 AM  [place] [address] [place], [place], [address] Phone: [phone]   Fax:  [phone]  Name: Dennis Miles MRN: [id number] Date of Birth: Dec 03, 1950

## 2015-08-17 ENCOUNTER — Encounter (HOSPITAL_COMMUNITY): Payer: BLUE CROSS/BLUE SHIELD

## 2015-08-17 ENCOUNTER — Encounter (HOSPITAL_COMMUNITY): Payer: Self-pay | Admitting: Occupational Therapy

## 2015-08-17 ENCOUNTER — Ambulatory Visit (HOSPITAL_COMMUNITY): Payer: BLUE CROSS/BLUE SHIELD | Admitting: Occupational Therapy

## 2015-08-17 DIAGNOSIS — M542 Cervicalgia: Secondary | ICD-10-CM

## 2015-08-17 DIAGNOSIS — M6289 Other specified disorders of muscle: Secondary | ICD-10-CM

## 2015-08-17 DIAGNOSIS — M629 Disorder of muscle, unspecified: Secondary | ICD-10-CM

## 2015-08-17 DIAGNOSIS — R29898 Other symptoms and signs involving the musculoskeletal system: Secondary | ICD-10-CM

## 2015-08-17 NOTE — Therapy (Addendum)
[place], [place], [address] Phone: [phone]   Fax:  [phone]  Occupational Therapy Treatment  Patient Details  Name: Dennis Miles MRN: [id number] Date of Birth: 11-01-1950 Referring Provider: Dr. Suella Broad  Encounter Date: 08/17/2015      OT End of Session - 08/17/15 1544    Visit Number 6   Number of Visits 8   Date for OT Re-Evaluation 08/31/15   Authorization Type BCBS    Authorization - Visit Number 6   Authorization - Number of Visits 30   OT Start Time 1351   OT Stop Time 1430   OT Time Calculation (min) 39 min   Activity Tolerance Patient tolerated treatment well   Behavior During Therapy [facility] for tasks assessed/performed      Past Medical History  Diagnosis Date  . Gout   . High cholesterol   . Hypertension   . GERD (gastroesophageal reflux disease)   . Anxiety   . Headache(784.0)   . Hiatal hernia     Past Surgical History  Procedure Laterality Date  . Sinus sugery    . Appendectomy    . Esophagogastroduodenoscopy  04/17/2011    Procedure: ESOPHAGOGASTRODUODENOSCOPY (EGD);  Surgeon: Rogene Houston, MD;  [facility];  Service: Endoscopy;  Laterality: N/A;  8:30  . Colonoscopy N/A 10/23/2012    Procedure: COLONOSCOPY;  Surgeon: Rogene Houston, MD;  [facility];  Service: Endoscopy;  Laterality: N/A;  235  . Inguinal hernia repair Left 02/17/2013    Procedure: HERNIA REPAIR INGUINAL ADULT;  Surgeon: Jamesetta So, MD;  Location: [facility];  Service: General;  Laterality: Left;  . Insertion of mesh Left 02/17/2013    Procedure: INSERTION OF MESH;  Surgeon: Jamesetta So, MD;  Location: [facility];  Service: General;  Laterality: Left;    There were no vitals filed for this visit.  Visit Diagnosis:  Tight fascia  Cervical pain (neck)  Decreased range of motion of neck      Subjective Assessment - 08/17/15 1354    Subjective  S: The doctor says I have  carpal tunnel in this hand. (left)   Currently in Pain? Yes   Pain Score 5    Pain Location Neck   Pain Orientation Left   Pain Descriptors / Indicators Aching   Pain Type Acute pain   Pain Radiating Towards down left arm   Pain Onset More than a month ago   Pain Frequency Intermittent   Aggravating Factors  stretches   Pain Relieving Factors rest, laying flat   Effect of Pain on Daily Activities none   Multiple Pain Sites No            [facility] OT Assessment - 08/17/15 1542    Assessment   Diagnosis cervical disc disorder; left hand numbness   Precautions   Precautions None                  OT Treatments/Exercises (OP) - 08/17/15 1356    Exercises   Exercises Shoulder;Hand;Neck   Neck Exercises: Stretches   Upper Trapezius Stretch 2 reps;20 seconds   Neck Exercises: Seated   X to V 10 reps   Shoulder Flexion 10 reps   Neck Exercises: Supine   Cervical Rotation Both;5 reps  P/ROM   Lateral Flexion Both;5 reps  P/ROM'   Shoulder Exercises: Seated   Extension AROM;10 reps   Retraction AROM;10 reps  Row AROM;10 reps   Protraction AROM;10 reps   Manual Therapy   Manual Therapy Myofascial release;Manual Traction   Manual therapy comments Manual therapy completed prior to therapy exercises.   Myofascial Release Myofascial release and manual stretching completed to left upper trapezius and scapularis region and left cervical region to decrease fascial restrictions and increase joint mobility in a pain free zone.    Manual Traction Gentle manual cervical traction completed this session.                   OT Short Term Goals - 08/03/15 0929    OT SHORT TERM GOAL #1   Title Pt will be educated and independent in HEP.    Time 4   Period Weeks   Status On-going   OT SHORT TERM GOAL #2   Title Pt will return to prior level of functioning and independence in daily and leisure tasks.    Time 4   Period Weeks   Status On-going   OT SHORT TERM GOAL #3    Title Pt will increase left grip strength by 10# to increase ability to open jar lids and operate.    Time 4   Period Weeks   Status On-going   OT SHORT TERM GOAL #4   Title Pt will decrease fascial restrictions in LUE and cervical region from mod to min amounts or less to increase mobility in cervical region and LUE.    Time 4   Period Weeks   Status On-going   OT SHORT TERM GOAL #5   Title Pt will increase cervical A/ROM to WNL to increase ability to looking left/right when driving and during daily tasks.    Time 4   Period Weeks   Status On-going   OT SHORT TERM GOAL #6   Title Pt will decrease pain in cervical region and LUE to 2/10 or less during daily and leisure tasks.    Time 4   Period Weeks   Status On-going                  Plan - 08/17/15 1544    Clinical Impression Statement A: Pt presents with prefabricated wrist brace on left wrist. Pt went to MD today and received a cortisone shot in the left wrist with instructions to wear brace continuously for 2 weeks, no wrist/grip strengthening at this time. Pt reports he is now able to turn his head with minimal pain, still experiences soreness & stiffness in the morning and at times during the day. Continued with manual therapy to cervical region, added x to v arms, shoulder protraction, shoulder flexion this session, min verbal cuing for form.    Plan P: continue with cervical and shoulder exercises, d/c wrist/grip strengthening until MD approves.         Problem List Patient Active Problem List   Diagnosis Date Noted  . Abdominal pain, left lower quadrant 10/19/2012  . GERD (gastroesophageal reflux disease) 04/15/2012  . Gout 04/01/2011  . Hypertension 04/01/2011  . Elevated cholesterol with high triglycerides 04/01/2011    Guadelupe Sabin, OTR/L  [phone]  08/17/2015, 3:49 PM  [place] [address] [place], [place], [address] Phone: [phone]   Fax:   [phone]  Name: Dennis Miles MRN: [id number] Date of Birth: Jul 26, 1950

## 2015-08-22 ENCOUNTER — Encounter (HOSPITAL_COMMUNITY): Payer: Self-pay

## 2015-08-22 ENCOUNTER — Ambulatory Visit (HOSPITAL_COMMUNITY): Payer: BLUE CROSS/BLUE SHIELD

## 2015-08-22 DIAGNOSIS — M542 Cervicalgia: Secondary | ICD-10-CM

## 2015-08-22 DIAGNOSIS — M629 Disorder of muscle, unspecified: Secondary | ICD-10-CM

## 2015-08-22 DIAGNOSIS — R29898 Other symptoms and signs involving the musculoskeletal system: Secondary | ICD-10-CM

## 2015-08-22 DIAGNOSIS — M6289 Other specified disorders of muscle: Secondary | ICD-10-CM

## 2015-08-22 NOTE — Therapy (Signed)
[place], [place], [address] Phone: [phone]   Fax:  [phone]  Patient Details  Name: Dennis Miles MRN: [id number] Date of Birth: 04/29/1951 Referring Provider:  No ref. provider found  Encounter Date: 08/22/2015 Called Dr. Amedeo Plenty at [facility] regarding [place] left wrist. Patient reports that he is to wear the resting hand splint at all times and to not do any exercise. Asked if we are ok to do ROM exercises or stretches once the 2 weeks is up or should we wait until Dennis Miles has his follow up in 6 weeks.  Dr. Vanetta Shawl office will call to let us know.   [name], OTR/L,CBIS  [phone]  08/22/2015, 3:54 PM  [facility] [address] [place], [place], [address] Phone: [phone]   Fax:  [phone]

## 2015-08-22 NOTE — Therapy (Addendum)
[place], [place], [address] Phone: [phone]   Fax:  [phone]  Occupational Therapy Treatment  Patient Details  Name: Dennis Miles MRN: [id number] Date of Birth: 1951-05-05 Referring Provider: Dr. Suella Broad  Encounter Date: 08/22/2015      OT End of Session - 08/22/15 0859    Visit Number 7   Number of Visits 8   Date for OT Re-Evaluation 08/31/15   Authorization Type BCBS    Authorization - Visit Number 7   Authorization - Number of Visits 30   OT Start Time 0850   OT Stop Time 0930   OT Time Calculation (min) 40 min   Activity Tolerance Patient tolerated treatment well   Behavior During Therapy [facility] for tasks assessed/performed      Past Medical History  Diagnosis Date  . Gout   . High cholesterol   . Hypertension   . GERD (gastroesophageal reflux disease)   . Anxiety   . Headache(784.0)   . Hiatal hernia     Past Surgical History  Procedure Laterality Date  . Sinus sugery    . Appendectomy    . Esophagogastroduodenoscopy  04/17/2011    Procedure: ESOPHAGOGASTRODUODENOSCOPY (EGD);  Surgeon: Rogene Houston, MD;  [facility];  Service: Endoscopy;  Laterality: N/A;  8:30  . Colonoscopy N/A 10/23/2012    Procedure: COLONOSCOPY;  Surgeon: Rogene Houston, MD;  [facility];  Service: Endoscopy;  Laterality: N/A;  235  . Inguinal hernia repair Left 02/17/2013    Procedure: HERNIA REPAIR INGUINAL ADULT;  Surgeon: Jamesetta So, MD;  Location: [facility];  Service: General;  Laterality: Left;  . Insertion of mesh Left 02/17/2013    Procedure: INSERTION OF MESH;  Surgeon: Jamesetta So, MD;  Location: [facility];  Service: General;  Laterality: Left;    There were no vitals filed for this visit.  Visit Diagnosis:  Tight fascia  Cervical pain (neck)  Decreased range of motion of neck      Subjective Assessment - 08/22/15 0859    Subjective  Where does all the  inflamation come from?   Pain Frequency Intermittent   Aggravating Factors  Moving it   Pain Relieving Factors Muscle relaxers            [facility] OT Assessment - 08/22/15 0853    Assessment   Diagnosis cervical disc disorder; left hand numbness   Precautions   Precautions None                  OT Treatments/Exercises (OP) - 08/22/15 0853    Exercises   Exercises Shoulder;Neck   Neck Exercises: Stretches   Upper Trapezius Stretch 2 reps;20 seconds  bilateral   Neck Exercises: Seated   X to V 10 reps   Neck Exercises: Supine   Cervical Rotation Both;5 reps   Lateral Flexion Both;5 reps   Neck Exercises: Prone   Axial Exentsion 10 reps   Shoulder Exercises: Prone   Other Prone Exercises Scapular raises; 10X   Other Prone Exercises Houghston Exercises 5 reps   Shoulder Exercises: Standing   Extension Theraband;10 reps   Theraband Level (Shoulder Extension) Level 2 (Red)   Row Theraband;10 reps   Theraband Level (Shoulder Row) Level 2 (Red)   Retraction Theraband;10 reps   Theraband Level (Shoulder Retraction) Level 2 (Red)   Manual Therapy   Manual Therapy Myofascial release;Manual Traction   Manual therapy comments  Manual therapy completed prior to therapy exercises.   Myofascial Release Myofascial release and manual stretching completed to left upper trapezius and scapularis region and left cervical region to decrease fascial restrictions and increase joint mobility in a pain free zone.    Manual Traction Gentle manual cervical traction completed this session.                 OT Education - 08/22/15 0900    Education provided Yes   Education Details explained spondylosis and cause of inflamation due to aging process   Person(s) Educated Patient   Methods Explanation   Comprehension Verbalized understanding          OT Short Term Goals - 08/03/15 0929    OT SHORT TERM GOAL #1   Title Pt will be educated and independent in [place].    Time 4    Period Weeks   Status On-going   OT SHORT TERM GOAL #2   Title Pt will return to prior level of functioning and independence in daily and leisure tasks.    Time 4   Period Weeks   Status On-going   OT SHORT TERM GOAL #3   Title Pt will increase left grip strength by 10# to increase ability to open jar lids and operate.    Time 4   Period Weeks   Status On-going   OT SHORT TERM GOAL #4   Title Pt will decrease fascial restrictions in LUE and cervical region from mod to min amounts or less to increase mobility in cervical region and LUE.    Time 4   Period Weeks   Status On-going   OT SHORT TERM GOAL #5   Title Pt will increase cervical A/ROM to WNL to increase ability to looking left/right when driving and during daily tasks.    Time 4   Period Weeks   Status On-going   OT SHORT TERM GOAL #6   Title Pt will decrease pain in cervical region and LUE to 2/10 or less during daily and leisure tasks.    Time 4   Period Weeks   Status On-going                  Plan - 08/22/15 0940    Clinical Impression Statement A: Added Houghston exercises and scapular theraband exercises completed with excellent form. P/ROM of neck improved, patient mentioned neck still feels like it's popping at base of skull.    Plan P: Therapist to follow up with Dr. Veronia Beets regarding precautions for L wrist after 2 weeks. Add theraband exercises to HEP next session.         Problem List Patient Active Problem List   Diagnosis Date Noted  . Abdominal pain, left lower quadrant 10/19/2012  . GERD (gastroesophageal reflux disease) 04/15/2012  . Gout 04/01/2011  . Hypertension 04/01/2011  . Elevated cholesterol with high triglycerides 04/01/2011    [name], OTR/L,CBIS  [phone]  08/22/2015, 9:45 AM  [facility] [address] [place], [place], [address] Phone: [phone]   Fax:  [phone]  Name: Dennis Miles MRN: [id number] Date  of Birth: 1950-06-04

## 2015-08-25 ENCOUNTER — Encounter (HOSPITAL_COMMUNITY): Payer: Self-pay

## 2015-08-25 ENCOUNTER — Ambulatory Visit (HOSPITAL_COMMUNITY): Payer: BLUE CROSS/BLUE SHIELD

## 2015-08-25 DIAGNOSIS — M542 Cervicalgia: Secondary | ICD-10-CM | POA: Diagnosis not present

## 2015-08-25 DIAGNOSIS — M629 Disorder of muscle, unspecified: Secondary | ICD-10-CM

## 2015-08-25 DIAGNOSIS — R29898 Other symptoms and signs involving the musculoskeletal system: Secondary | ICD-10-CM

## 2015-08-25 DIAGNOSIS — M6289 Other specified disorders of muscle: Secondary | ICD-10-CM

## 2015-08-25 NOTE — Patient Instructions (Signed)

## 2015-08-25 NOTE — Therapy (Addendum)
[place], [place], [address] Phone: [phone]   Fax:  [phone]  Occupational Therapy Treatment  Patient Details  Name: Dennis Miles MRN: [id number] Date of Birth: 01/30/1951 Referring Provider: Dr. Suella Broad  Encounter Date: 08/25/2015      OT End of Session - 08/25/15 0851    Visit Number 8   Number of Visits 10   Date for OT Re-Evaluation 08/31/15   Authorization Type BCBS    Authorization - Visit Number 8   Authorization - Number of Visits 30   OT Start Time 0800   OT Stop Time 0850   OT Time Calculation (min) 50 min   Activity Tolerance Patient tolerated treatment well   Behavior During Therapy [facility] for tasks assessed/performed      Past Medical History  Diagnosis Date  . Gout   . High cholesterol   . Hypertension   . GERD (gastroesophageal reflux disease)   . Anxiety   . Headache(784.0)   . Hiatal hernia     Past Surgical History  Procedure Laterality Date  . Sinus sugery    . Appendectomy    . Esophagogastroduodenoscopy  04/17/2011    Procedure: ESOPHAGOGASTRODUODENOSCOPY (EGD);  Surgeon: Rogene Houston, MD;  [facility];  Service: Endoscopy;  Laterality: N/A;  8:30  . Colonoscopy N/A 10/23/2012    Procedure: COLONOSCOPY;  Surgeon: Rogene Houston, MD;  [facility];  Service: Endoscopy;  Laterality: N/A;  235  . Inguinal hernia repair Left 02/17/2013    Procedure: HERNIA REPAIR INGUINAL ADULT;  Surgeon: Jamesetta So, MD;  Location: [facility];  Service: General;  Laterality: Left;  . Insertion of mesh Left 02/17/2013    Procedure: INSERTION OF MESH;  Surgeon: Jamesetta So, MD;  Location: [facility];  Service: General;  Laterality: Left;    There were no vitals filed for this visit.  Visit Diagnosis:  Cervical pain (neck)  Decreased range of motion of neck  Tight fascia      Subjective Assessment - 08/25/15 0806    Subjective  I forgot my wrist brace  today, I took it off to take a shower and forgot to put it back on.   Currently in Pain? Yes   Pain Score 3    Pain Location Thoracic   Pain Orientation Upper   Pain Descriptors / Indicators Sore   Pain Type Acute pain   Pain Onset More than a month ago   Pain Frequency Intermittent   Multiple Pain Sites No            [facility] OT Assessment - 08/25/15 0810    Assessment   Diagnosis cervical disc disorder; leftt hand numbness   Precautions   Precautions Other (comment)   Precaution Comments Per Dr. Veronia Beets continue to treat cervical region only. Do not touch left wrist until his follow up appointment.                   OT Treatments/Exercises (OP) - 08/25/15 0810    Exercises   Exercises Shoulder;Neck   Neck Exercises: Seated   X to V 10 reps   W Back 10 reps   Neck Exercises: Supine   Cervical Rotation Both;5 reps   Lateral Flexion Both;5 reps   Neck Exercises: Prone   Axial Exentsion Other reps (comment)  12 reps   Shoulder Exercises: Supine   Other Supine Exercises Serratus anterior punch; 12 reps  Shoulder Exercises: Prone   Other Prone Exercises Scapular raises; 12X   Other Prone Exercises Houghston Exercises 10 reps   Shoulder Exercises: Standing   Extension Theraband;10 reps   Theraband Level (Shoulder Extension) Level 2 (Red)   Row Theraband;10 reps   Theraband Level (Shoulder Row) Level 2 (Red)   Retraction Theraband;10 reps   Theraband Level (Shoulder Retraction) Level 2 (Red)   Shoulder Exercises: ROM/Strengthening   Cybex Row 2 plate;10 reps;2.5 plate;15 reps   Manual Therapy   Manual Therapy Myofascial release;Manual Traction   Manual therapy comments Manual therapy completed prior to therapy exercises.   Myofascial Release Myofascial release and manual stretching completed to left upper trapezius and scapularis region and left cervical region to decrease fascial restrictions and increase joint mobility in a pain free zone.    Manual Traction  Gentle manual cervical traction completed this session.                 OT Education - 08/25/15 0850    Education provided Yes   Education Details Provided HEP with theraband exercises.   Person(s) Educated Patient   Methods Explanation;Demonstration;Handout;Tactile cues;Verbal cues   Comprehension Returned demonstration;Verbalized understanding;Verbal cues required          OT Short Term Goals - 08/03/15 0929    OT SHORT TERM GOAL #1   Title Pt will be educated and independent in HEP.    Time 4   Period Weeks   Status On-going   OT SHORT TERM GOAL #2   Title Pt will return to prior level of functioning and independence in daily and leisure tasks.    Time 4   Period Weeks   Status On-going   OT SHORT TERM GOAL #3   Title Pt will increase left grip strength by 10# to increase ability to open jar lids and operate.    Time 4   Period Weeks   Status On-going   OT SHORT TERM GOAL #4   Title Pt will decrease fascial restrictions in LUE and cervical region from mod to min amounts or less to increase mobility in cervical region and LUE.    Time 4   Period Weeks   Status On-going   OT SHORT TERM GOAL #5   Title Pt will increase cervical A/ROM to WNL to increase ability to looking left/right when driving and during daily tasks.    Time 4   Period Weeks   Status On-going   OT SHORT TERM GOAL #6   Title Pt will decrease pain in cervical region and LUE to 2/10 or less during daily and leisure tasks.    Time 4   Period Weeks   Status On-going                  Plan - 08/25/15 [id number]    Clinical Impression Statement A: Added serratus anterior punch, W-arms, and Cybex row. Increased reps on scapular raises, Houghston exercises, and axial extension. Provided patient with theraband exercises to add to his HEP. Therapist spoke with Dr. Maryanna Shape office and was directed to continue treating the neck only, not the L wrist.    Plan P: Complete reassessment.         Problem List Patient Active Problem List   Diagnosis Date Noted  . Abdominal pain, left lower quadrant 10/19/2012  . GERD (gastroesophageal reflux disease) 04/15/2012  . Gout 04/01/2011  . Hypertension 04/01/2011  . Elevated cholesterol with high triglycerides 04/01/2011    Dennis Miles,  OTR/L,CBIS  [id number]  08/25/2015, 11:26 AM  [facility] [address] [place], [place], [address] Phone: [phone]   Fax:  [phone]  Name: RONIEL MAYTON MRN: [id number] Date of Birth: 09-12-1950

## 2015-08-29 ENCOUNTER — Encounter (HOSPITAL_COMMUNITY): Payer: Self-pay

## 2015-08-29 ENCOUNTER — Ambulatory Visit (HOSPITAL_COMMUNITY): Payer: BLUE CROSS/BLUE SHIELD

## 2015-08-29 DIAGNOSIS — M629 Disorder of muscle, unspecified: Secondary | ICD-10-CM

## 2015-08-29 DIAGNOSIS — M6289 Other specified disorders of muscle: Secondary | ICD-10-CM

## 2015-08-29 DIAGNOSIS — M542 Cervicalgia: Secondary | ICD-10-CM | POA: Diagnosis not present

## 2015-08-29 DIAGNOSIS — R29898 Other symptoms and signs involving the musculoskeletal system: Secondary | ICD-10-CM

## 2015-08-29 NOTE — Patient Instructions (Signed)
ROTATIONAL STRETCH WITH OVER PRESSURE  Turn your head to one side as far as you can and then use your same-side-hand to assist in turning the head further for a gentle stetch.  (Turn your head to the right. Place hand on your left cheek to gently push/stretch) Hold for 2-3 seconds and complete 5 times.    UPPER TRAP STRETCH - HOLDING CHAIR AND HEAD  While sitting in a chair, hold the seat with one hand and place your other hand on your head to assist in bending your head to the side as shown.   Bend your head towards the opposite side of the hand that is holding the chair seat. You should feel a stretch to the side of your neck.  (Tilt your head to the right. **Right ear to right shoulder**. )  Hold for 2-3 seconds and repeat 2 times.

## 2015-08-29 NOTE — Therapy (Addendum)
[place], [place], [address] Phone: [phone]   Fax:  [phone]  Occupational Therapy Treatment And mini reassessment/recertifcation Patient Details  Name: Dennis Miles MRN: [id number] Date of Birth: [birth year] Referring Provider: Dr. Suella Broad  Encounter Date: 08/29/2015      OT End of Session - 08/29/15 1019    Visit Number 9   Number of Visits 10   Date for OT Re-Evaluation 08/31/15   Authorization Type BCBS    Authorization - Visit Number 9   Authorization - Number of Visits 30   OT Start Time 0850  mini reasessment   OT Stop Time 0930   OT Time Calculation (min) 40 min   Activity Tolerance Patient tolerated treatment well   Behavior During Therapy [facility] for tasks assessed/performed      Past Medical History  Diagnosis Date  . Gout   . High cholesterol   . Hypertension   . GERD (gastroesophageal reflux disease)   . Anxiety   . Headache(784.0)   . Hiatal hernia     Past Surgical History  Procedure Laterality Date  . Sinus sugery    . Appendectomy    . Esophagogastroduodenoscopy  04/17/2011    Procedure: ESOPHAGOGASTRODUODENOSCOPY (EGD);  Surgeon: Rogene Houston, MD;  [facility];  Service: Endoscopy;  Laterality: N/A;  8:30  . Colonoscopy N/A 10/23/2012    Procedure: COLONOSCOPY;  Surgeon: Rogene Houston, MD;  [facility];  Service: Endoscopy;  Laterality: N/A;  235  . Inguinal hernia repair Left 02/17/2013    Procedure: HERNIA REPAIR INGUINAL ADULT;  Surgeon: Jamesetta So, MD;  Location: [facility];  Service: General;  Laterality: Left;  . Insertion of mesh Left 02/17/2013    Procedure: INSERTION OF MESH;  Surgeon: Jamesetta So, MD;  Location: [facility];  Service: General;  Laterality: Left;    There were no vitals filed for this visit.  Visit Diagnosis:  Cervical pain (neck)  Decreased range of motion of neck  Tight fascia      Subjective Assessment -  08/29/15 0904    Subjective  S: I feel like therapy has helped although it just depends on what I do.    Currently in Pain? Yes   Pain Score 5    Pain Location Neck   Pain Descriptors / Indicators Sore   Pain Type Acute pain   Pain Onset More than a month ago            [facility] OT Assessment - 08/29/15 0905    Assessment   Diagnosis cervical disc disorder; leftt hand numbness   Precautions   Precautions Other (comment)   Precaution Comments Per Dr. Veronia Beets continue to treat cervical region only. Do not touch left wrist until his follow up appointment.    AROM   Overall AROM Comments All shoulder A/ROM is [facility]   AROM Assessment Site Cervical   Cervical Flexion 80  Previous 52   Cervical Extension 75  Previous 74   Cervical - Right Side Bend 35  previous 35   Cervical - Left Side Bend 50  Previous 31   Cervical - Right Rotation 67  previous 66   Cervical - Left Rotation 80  previous 65                  OT Treatments/Exercises (OP) - 08/29/15 0905    Exercises   Exercises Shoulder;Neck   Neck Exercises:  Stretches   Upper Trapezius Stretch 2 reps;20 seconds   Other Neck Stretches Rotational stretch to the right 5 reps   Neck Exercises: Supine   Cervical Rotation Both;5 reps   Lateral Flexion Both;5 reps   Neck Exercises: Prone   Axial Exentsion Other reps (comment)   Shoulder Exercises: Prone   Other Prone Exercises Scapular raises; 12X   Other Prone Exercises Houghston Exercises 12 reps   Manual Therapy   Manual Therapy Myofascial release;Manual Traction   Manual therapy comments Manual therapy completed prior to therapy exercises.   Myofascial Release Myofascial release and manual stretching completed to left upper trapezius and scapularis region and left cervical region to decrease fascial restrictions and increase joint mobility in a pain free zone.    Manual Traction Gentle manual cervical traction completed this session.                 OT  Education - 08/29/15 0930    Education provided Yes   Education Details Provided HEP with rotational neck stretch and upper trapexious stretch.   Person(s) Educated Patient   Methods Explanation;Demonstration;Verbal cues   Comprehension Verbalized understanding;Returned demonstration;Verbal cues required          OT Short Term Goals - 08/29/15 0910    OT SHORT TERM GOAL #1   Title Pt will be educated and independent in HEP.    Time 4   Period Weeks   Status Achieved   OT SHORT TERM GOAL #2   Title Pt will return to prior level of functioning and independence in daily and leisure tasks.    Time 4   Period Weeks   Status On-going   OT SHORT TERM GOAL #3   Title Pt will increase left grip strength by 10# to increase ability to open jar lids and operate.    Time 4   Period Weeks   Status Unable to assess - Due to verbal request from Dr. Amedeo Plenty.   OT SHORT TERM GOAL #4   Title Pt will decrease fascial restrictions in LUE and cervical region from mod to min amounts or less to increase mobility in cervical region and LUE.    Time 4   Period Weeks   Status Partially Met   OT SHORT TERM GOAL #5   Title Pt will increase cervical A/ROM to WNL to increase ability to looking left/right when driving and during daily tasks.    Time 4   Period Weeks   Status Partially Met   OT SHORT TERM GOAL #6   Title Pt will decrease pain in cervical region and LUE to 2/10 or less during daily and leisure tasks.    Time 4   Period Weeks   Status On-going                  Plan - 08/29/15 1020    Clinical Impression Statement A: Mini reassessment completed this date. patient has met 1/6 goals with 2 partially met. Patient has shown improvement with left cervical rotation and lateral flexion although. Pt continues to have ROM restrictions with right cervical rotation and lateral flexion. Pain remains elevated depending on the activity that is performed. Pt reports that he believes that therapy  has helped  although he continues to experience pain and a crackling sensation whenever he moves his neck. Pt reports that Dr. Nelva Bush has not recommended that he return to see him as he states there is nothing for him to do. Dr. Amedeo Plenty which  patient is seeing for his carpal tunnel recommended that Mr. Oldaker be seen by a cervical nuerologist. Patient has not been able to get in to see the nuerologist yet as he needs a referral to be seen. Recommended that patient call the new MD  to ask if they received a referral yet if they have not then to call Dr. Vanetta Shawl office to request one to be sent today. Pt verbalized understanding.    Plan P: Recommend continuing therapy until patient can get an appointment with cevical neurologist. Continue to focus on scapular strengthening and anterior cervical strengthening to try and decrease pain level during daily tasks.         Problem List Patient Active Problem List   Diagnosis Date Noted  . Abdominal pain, left lower quadrant 10/19/2012  . GERD (gastroesophageal reflux disease) 04/15/2012  . Gout 04/01/2011  . Hypertension 04/01/2011  . Elevated cholesterol with high triglycerides 04/01/2011    [name], OTR/L,CBIS  [phone]  08/29/2015, 10:29 AM  [facility] [address] [place], [place], [address] Phone: [phone]   Fax:  [phone]  Name: Dennis Miles MRN: [id number] Date of Birth: Aug 02, 1950

## 2015-09-01 ENCOUNTER — Ambulatory Visit (HOSPITAL_COMMUNITY): Payer: BLUE CROSS/BLUE SHIELD

## 2015-09-04 ENCOUNTER — Encounter (HOSPITAL_COMMUNITY): Payer: Self-pay

## 2015-09-04 ENCOUNTER — Ambulatory Visit (HOSPITAL_COMMUNITY): Payer: BLUE CROSS/BLUE SHIELD | Attending: Physical Medicine and Rehabilitation

## 2015-09-04 DIAGNOSIS — M542 Cervicalgia: Secondary | ICD-10-CM | POA: Diagnosis present

## 2015-09-04 DIAGNOSIS — R29898 Other symptoms and signs involving the musculoskeletal system: Secondary | ICD-10-CM | POA: Diagnosis present

## 2015-09-04 DIAGNOSIS — M629 Disorder of muscle, unspecified: Secondary | ICD-10-CM | POA: Insufficient documentation

## 2015-09-04 DIAGNOSIS — M5412 Radiculopathy, cervical region: Secondary | ICD-10-CM | POA: Diagnosis present

## 2015-09-04 DIAGNOSIS — M6289 Other specified disorders of muscle: Secondary | ICD-10-CM

## 2015-09-04 NOTE — Therapy (Addendum)
[place], [place], [address] Phone: [phone]   Fax:  [phone]  Occupational Therapy Treatment  Patient Details  Name: Dennis Miles MRN: [id number] Date of Birth: May 26, 1951 Referring Provider: Dr. Suella Broad  Encounter Date: 09/04/2015      OT End of Session - 09/04/15 0853    Visit Number 10   Number of Visits 18   Date for OT Re-Evaluation 09/28/15   Authorization Type BCBS    Authorization - Visit Number 10   Authorization - Number of Visits 30   OT Start Time 0800   OT Stop Time 0845   OT Time Calculation (min) 45 min   Activity Tolerance Patient tolerated treatment well   Behavior During Therapy [facility] for tasks assessed/performed      Past Medical History  Diagnosis Date  . Gout   . High cholesterol   . Hypertension   . GERD (gastroesophageal reflux disease)   . Anxiety   . Headache(784.0)   . Hiatal hernia     Past Surgical History  Procedure Laterality Date  . Sinus sugery    . Appendectomy    . Esophagogastroduodenoscopy  04/17/2011    Procedure: ESOPHAGOGASTRODUODENOSCOPY (EGD);  Surgeon: Rogene Houston, MD;  [facility];  Service: Endoscopy;  Laterality: N/A;  8:30  . Colonoscopy N/A 10/23/2012    Procedure: COLONOSCOPY;  Surgeon: Rogene Houston, MD;  [facility];  Service: Endoscopy;  Laterality: N/A;  235  . Inguinal hernia repair Left 02/17/2013    Procedure: HERNIA REPAIR INGUINAL ADULT;  Surgeon: Jamesetta So, MD;  Location: [facility];  Service: General;  Laterality: Left;  . Insertion of mesh Left 02/17/2013    Procedure: INSERTION OF MESH;  Surgeon: Jamesetta So, MD;  Location: [facility];  Service: General;  Laterality: Left;    There were no vitals filed for this visit.  Visit Diagnosis:  Cervical pain (neck)  Decreased range of motion of neck  Tight fascia      Subjective Assessment - 09/04/15 0805    Subjective  S: My pain is mild  today, really just stiff.   Currently in Pain? Yes   Pain Score 2    Pain Location Neck   Pain Orientation Left   Pain Descriptors / Indicators Aching   Pain Type Acute pain   Pain Frequency Intermittent   Aggravating Factors  When I move it   Pain Relieving Factors rest   Effect of Pain on Daily Activities none   Multiple Pain Sites No            [facility] OT Assessment - 09/04/15 0807    Assessment   Diagnosis cervical disc disorder; left hand numbness   Precautions   Precautions Other (comment)   Precaution Comments Per Dr. Veronia Beets continue to treat cervical region only. Do not touch left wrist until his follow up appointment.                   OT Treatments/Exercises (OP) - 09/04/15 0807    Exercises   Exercises Shoulder;Neck   Neck Exercises: Supine   Cervical Rotation Both;5 reps   Lateral Flexion Both;5 reps   Neck Exercises: Prone   Axial Exentsion Other reps (comment)   Other Prone Exercise Angel wings 10X   Shoulder Exercises: Prone   Other Prone Exercises Scapular raises; 12X   Other Prone Exercises [place] Exercises 12 reps   Shoulder  Exercises: Standing   Extension Theraband;12 reps   Theraband Level (Shoulder Extension) Level 2 (Red)   Row Theraband;12 reps   Theraband Level (Shoulder Row) Level 2 (Red)   Retraction Theraband;12 reps   Theraband Level (Shoulder Retraction) Level 2 (Red)   Shoulder Exercises: ROM/Strengthening   UBE (Upper Arm Bike) Level 2 3' in reverse   Cybex Row 2.5 plate;20 reps   Other ROM/Strengthening Exercises Angelwings on wall in standing 10X   Manual Therapy   Manual Therapy Myofascial release;Manual Traction   Manual therapy comments Manual therapy completed prior to therapy exercises.   Myofascial Release Myofascial release and manual stretching completed to left upper trapezius and scapularis region and left cervical region to decrease fascial restrictions and increase joint mobility in a pain free zone.     Manual Traction Gentle manual cervical traction completed this session.                   OT Short Term Goals - 09/04/15 0931    OT SHORT TERM GOAL #1   Title Pt will be educated and independent in HEP.    Time 4   Period Weeks   OT SHORT TERM GOAL #2   Title Pt will return to prior level of functioning and independence in daily and leisure tasks.    Time 4   Period Weeks   Status On-going   OT SHORT TERM GOAL #3   Title Pt will increase left grip strength by 10# to increase ability to open jar lids and operate.    Time 4   Period Weeks   Status On-going   OT SHORT TERM GOAL #4   Title Pt will decrease fascial restrictions in LUE and cervical region from mod to min amounts or less to increase mobility in cervical region and LUE.    Time 4   Period Weeks   Status Partially Met   OT SHORT TERM GOAL #5   Title Pt will increase cervical A/ROM to WNL to increase ability to looking left/right when driving and during daily tasks.    Time 4   Period Weeks   Status Partially Met   OT SHORT TERM GOAL #6   Title Pt will decrease pain in cervical region and LUE to 2/10 or less during daily and leisure tasks.    Time 4   Period Weeks   Status On-going                  Plan - 09/04/15 0854    Clinical Impression Statement A: Therapist noted decreased fascial restrictions in left upper trapezious this session. Added arm bike in reverse, therapist provides verbal cues for speed modulation. Added angelwing exercise in prone and in standing. Increased reps on Cybex row and Theraband exercises. Patient reports he only needs to wear his wrist brace at night for the next 6 weeks per Dr. Veronia Beets. Patient reports continued difficulty with scheduling apointment with cervical neurologist,.   Plan P: Continue with new exercises added this session to focus on scapular strengthening and anterior cervical strengthning to decrease pain level during daily tasks. Add overhead lacing with  1lb wrist weight.        Problem List Patient Active Problem List   Diagnosis Date Noted  . Abdominal pain, left lower quadrant 10/19/2012  . GERD (gastroesophageal reflux disease) 04/15/2012  . Gout 04/01/2011  . Hypertension 04/01/2011  . Elevated cholesterol with high triglycerides 04/01/2011    [name], OTR/L,CBIS  [phone]  09/04/2015, 9:32 AM  [facility] [address] [place], [place], [address] Phone: [phone]   Fax:  [phone]  Name: Dennis Miles MRN: [id number] Date of Birth: 11-26-1950

## 2015-09-05 ENCOUNTER — Other Ambulatory Visit (INDEPENDENT_AMBULATORY_CARE_PROVIDER_SITE_OTHER): Payer: Self-pay | Admitting: Internal Medicine

## 2015-09-06 ENCOUNTER — Ambulatory Visit (HOSPITAL_COMMUNITY): Payer: BLUE CROSS/BLUE SHIELD

## 2015-09-06 ENCOUNTER — Encounter (HOSPITAL_COMMUNITY): Payer: Self-pay

## 2015-09-06 DIAGNOSIS — R29898 Other symptoms and signs involving the musculoskeletal system: Secondary | ICD-10-CM

## 2015-09-06 DIAGNOSIS — M542 Cervicalgia: Secondary | ICD-10-CM | POA: Diagnosis not present

## 2015-09-06 NOTE — Therapy (Addendum)
[place], [place], [address] Phone: [phone]   Fax:  [phone]  Occupational Therapy Treatment  Patient Details  Name: Dennis Miles MRN: [id number] Date of Birth: 11/29/1950 Referring Provider: Dr. Suella Broad  Encounter Date: 09/06/2015      OT End of Session - 09/06/15 0929    Visit Number 11   Number of Visits 18   Date for OT Re-Evaluation 09/28/15   Authorization Type BCBS    Authorization - Visit Number 11   Authorization - Number of Visits 30   OT Start Time 0850   OT Stop Time 0930   OT Time Calculation (min) 40 min   Activity Tolerance Patient tolerated treatment well   Behavior During Therapy [facility] for tasks assessed/performed      Past Medical History  Diagnosis Date  . Gout   . High cholesterol   . Hypertension   . GERD (gastroesophageal reflux disease)   . Anxiety   . Headache(784.0)   . Hiatal hernia     Past Surgical History  Procedure Laterality Date  . Sinus sugery    . Appendectomy    . Esophagogastroduodenoscopy  04/17/2011    Procedure: ESOPHAGOGASTRODUODENOSCOPY (EGD);  Surgeon: Rogene Houston, MD;  [facility];  Service: Endoscopy;  Laterality: N/A;  8:30  . Colonoscopy N/A 10/23/2012    Procedure: COLONOSCOPY;  Surgeon: Rogene Houston, MD;  [facility];  Service: Endoscopy;  Laterality: N/A;  235  . Inguinal hernia repair Left 02/17/2013    Procedure: HERNIA REPAIR INGUINAL ADULT;  Surgeon: Jamesetta So, MD;  Location: [facility];  Service: General;  Laterality: Left;  . Insertion of mesh Left 02/17/2013    Procedure: INSERTION OF MESH;  Surgeon: Jamesetta So, MD;  Location: [facility];  Service: General;  Laterality: Left;    There were no vitals filed for this visit.  Visit Diagnosis:  Cervical pain (neck)  Decreased range of motion of neck      Subjective Assessment - 09/06/15 0856    Subjective  S: Last night it was really bad, I don't  know why. it's not usually worse at night.   Pain Score 3    Pain Location Neck   Pain Orientation Upper   Pain Descriptors / Indicators Aching   Pain Type Acute pain   Pain Onset More than a month ago   Pain Frequency Intermittent            [facility] OT Assessment - 09/06/15 0858    Assessment   Diagnosis cervical disc disorder;leftt hand numbness   Precautions   Precautions Other (comment)   Precaution Comments Per Dr. Veronia Beets continue to treat cervical region only. Do not touch left wrist until his follow up appointment.                   OT Treatments/Exercises (OP) - 09/06/15 0858    Exercises   Exercises Shoulder;Neck   Neck Exercises: Supine   Cervical Rotation Both;5 reps   Lateral Flexion Both;5 reps   Neck Exercises: Prone   Axial Exentsion Other reps (comment)  12   Other Prone Exercise Angel wings 10X   Shoulder Exercises: Supine   Other Supine Exercises Serratus anterior punch; 12 reps   Shoulder Exercises: Prone   Other Prone Exercises Scapular raises; 12X   Other Prone Exercises [place] Exercises 10 reps   Shoulder Exercises: ROM/Strengthening   UBE (Upper  Arm Bike) Level 3 3' in reverse   Cybex Row 3 plate;15 reps   Other ROM/Strengthening Exercises Angelwings on wall in standing 10X   Manual Therapy   Manual Therapy Myofascial release;Manual Traction   Manual therapy comments Manual therapy completed prior to therapy exercises.   Myofascial Release Myofascial release and manual stretching completed to left upper trapezius and scapularis region and left cervical region to decrease fascial restrictions and increase joint mobility in a pain free zone.    Manual Traction Gentle manual cervical traction completed this session.                   OT Short Term Goals - 09/04/15 0931    OT SHORT TERM GOAL #1   Title Pt will be educated and independent in HEP.    Time 4   Period Weeks   OT SHORT TERM GOAL #2   Title Pt will return to  prior level of functioning and independence in daily and leisure tasks.    Time 4   Period Weeks   Status On-going   OT SHORT TERM GOAL #3   Title Pt will increase left grip strength by 10# to increase ability to open jar lids and operate.    Time 4   Period Weeks   Status On-going   OT SHORT TERM GOAL #4   Title Pt will decrease fascial restrictions in LUE and cervical region from mod to min amounts or less to increase mobility in cervical region and LUE.    Time 4   Period Weeks   Status Partially Met   OT SHORT TERM GOAL #5   Title Pt will increase cervical A/ROM to WNL to increase ability to looking left/right when driving and during daily tasks.    Time 4   Period Weeks   Status Partially Met   OT SHORT TERM GOAL #6   Title Pt will decrease pain in cervical region and LUE to 2/10 or less during daily and leisure tasks.    Time 4   Period Weeks   Status On-going                  Plan - 09/06/15 0929    Clinical Impression Statement A: Patient reports increased pain at night but states it is not all the time. Continued with exercises added last session, did not complete Theraband exercises due to time. Patient has still been unable to schedule appointment with cervical neurologist.   Plan P: Continue with exercises to focus on scapular strengthning and anterior cervical strengthning to decrease pain level during daily tasks. Add overhead lacing with 1lb wrist weight.        Problem List Patient Active Problem List   Diagnosis Date Noted  . Abdominal pain, left lower quadrant 10/19/2012  . GERD (gastroesophageal reflux disease) 04/15/2012  . Gout 04/01/2011  . Hypertension 04/01/2011  . Elevated cholesterol with high triglycerides 04/01/2011    [name], OTR/L,CBIS  [phone]  09/06/2015, 9:34 AM  [facility] [address] [place], [place], [address] Phone: [phone]   Fax:  [phone]  Name:  Dennis Miles MRN: [id number] Date of Birth: 03-09-1951

## 2015-09-11 ENCOUNTER — Encounter (HOSPITAL_COMMUNITY): Payer: Self-pay

## 2015-09-11 ENCOUNTER — Ambulatory Visit (HOSPITAL_COMMUNITY): Payer: BLUE CROSS/BLUE SHIELD

## 2015-09-11 DIAGNOSIS — R29898 Other symptoms and signs involving the musculoskeletal system: Secondary | ICD-10-CM

## 2015-09-11 DIAGNOSIS — M542 Cervicalgia: Secondary | ICD-10-CM | POA: Diagnosis not present

## 2015-09-11 DIAGNOSIS — M5412 Radiculopathy, cervical region: Secondary | ICD-10-CM

## 2015-09-11 NOTE — Therapy (Addendum)
[place], [place], [address] Phone: [phone]   Fax:  [phone]  Occupational Therapy Treatment  Patient Details  Name: Dennis Miles MRN: [id number] Date of Birth: 02-10-1951 Referring Provider: Dr. Suella Broad  Encounter Date: 09/11/2015      OT End of Session - 09/11/15 1033    Visit Number 12   Number of Visits 18   Date for OT Re-Evaluation 09/28/15   Authorization Type BCBS    Authorization - Visit Number 12   Authorization - Number of Visits 30   OT Start Time 0945   OT Stop Time 1030   OT Time Calculation (min) 45 min   Activity Tolerance Patient tolerated treatment well   Behavior During Therapy [facility] for tasks assessed/performed      Past Medical History  Diagnosis Date  . Gout   . High cholesterol   . Hypertension   . GERD (gastroesophageal reflux disease)   . Anxiety   . Headache(784.0)   . Hiatal hernia     Past Surgical History  Procedure Laterality Date  . Sinus sugery    . Appendectomy    . Esophagogastroduodenoscopy  04/17/2011    Procedure: ESOPHAGOGASTRODUODENOSCOPY (EGD);  Surgeon: Rogene Houston, MD;  [facility];  Service: Endoscopy;  Laterality: N/A;  8:30  . Colonoscopy N/A 10/23/2012    Procedure: COLONOSCOPY;  Surgeon: Rogene Houston, MD;  [facility];  Service: Endoscopy;  Laterality: N/A;  235  . Inguinal hernia repair Left 02/17/2013    Procedure: HERNIA REPAIR INGUINAL ADULT;  Surgeon: Jamesetta So, MD;  Location: [facility];  Service: General;  Laterality: Left;  . Insertion of mesh Left 02/17/2013    Procedure: INSERTION OF MESH;  Surgeon: Jamesetta So, MD;  Location: [facility];  Service: General;  Laterality: Left;    There were no vitals filed for this visit.      Subjective Assessment - 09/11/15 0953    Subjective  S: I think therapy is definitely helping.   Currently in Pain? Yes   Pain Score 3    Pain Location Neck   Pain  Orientation Upper   Pain Descriptors / Indicators Aching   Pain Type Acute pain   Pain Onset More than a month ago   Pain Frequency Constant   Aggravating Factors  Movement   Pain Relieving Factors rest   Effect of Pain on Daily Activities Doing less   Multiple Pain Sites No            [facility] OT Assessment - 09/11/15 0956    Assessment   Diagnosis cervical disc disorder; left hand numbness   Precautions   Precautions Other (comment)   Precaution Comments Per Dr. Veronia Beets continue to treat cervical region only. Do not touch left wrist until his follow up appointment.                   OT Treatments/Exercises (OP) - 09/11/15 0956    Exercises   Exercises Shoulder;Neck   Neck Exercises: Supine   Cervical Rotation Both;5 reps   Lateral Flexion Both;5 reps   Neck Exercises: Prone   Axial Exentsion Other reps (comment)  12   Other Prone Exercise Angel wings 10X   Shoulder Exercises: Prone   Other Prone Exercises Scapular raises; 12X   Other Prone Exercises [place] Exercises 10 reps   Shoulder Exercises: Standing   Extension Theraband;12 reps   Theraband  Level (Shoulder Extension) Level 3 (Green)   Row Theraband;12 reps   Theraband Level (Shoulder Row) Level 3 (Green)   Retraction Theraband;12 reps   Theraband Level (Shoulder Retraction) Level 3 (Green)   Shoulder Exercises: ROM/Strengthening   Cybex Row 3 plate;20 reps   Over Head Lace 2' with 1# wrist weight   Other ROM/Strengthening Exercises Angelwings on wall in standing 12X   Manual Therapy   Manual Therapy Myofascial release;Manual Traction   Manual therapy comments Manual therapy completed prior to therapy exercises.   Myofascial Release Myofascial release and manual stretching completed to left upper trapezius and scapularis region and left cervical region to decrease fascial restrictions and increase joint mobility in a pain free zone.    Manual Traction Gentle manual cervical traction completed this  session.                 OT Education - 09/11/15 [phone]    Education provided Yes   Education Details Patient was curious about nerves and where his pain could be coming from, therapist provided verbal and visual information on anatomy of neck/shoulder.    Person(s) Educated Patient   Methods Explanation   Comprehension Verbalized understanding          OT Short Term Goals - 09/04/15 0931    OT SHORT TERM GOAL #1   Title Pt will be educated and independent in HEP.    Time 4   Period Weeks   OT SHORT TERM GOAL #2   Title Pt will return to prior level of functioning and independence in daily and leisure tasks.    Time 4   Period Weeks   Status On-going   OT SHORT TERM GOAL #3   Title Pt will increase left grip strength by 10# to increase ability to open jar lids and operate.    Time 4   Period Weeks   Status On-going   OT SHORT TERM GOAL #4   Title Pt will decrease fascial restrictions in LUE and cervical region from mod to min amounts or less to increase mobility in cervical region and LUE.    Time 4   Period Weeks   Status Partially Met   OT SHORT TERM GOAL #5   Title Pt will increase cervical A/ROM to WNL to increase ability to looking left/right when driving and during daily tasks.    Time 4   Period Weeks   Status Partially Met   OT SHORT TERM GOAL #6   Title Pt will decrease pain in cervical region and LUE to 2/10 or less during daily and leisure tasks.    Time 4   Period Weeks   Status On-going                  Plan - 09/11/15 1030    Clinical Impression Statement A: Added overhead lacing with 1 pound wrist weight, increased reps with Cybex row, and increased Theraband to green. Patient is still waiting to hear from cervical nerurologist.    Plan P: Continue with plan of care and exercises added today to focus on decreasing pain in cervical region.       Patient will benefit from skilled therapeutic intervention in order to improve the  following deficits and impairments:  Decreased strength, Pain, Impaired sensation, Impaired UE functional use, Decreased activity tolerance, Decreased range of motion, Increased fascial restricitons, Impaired flexibility  Visit Diagnosis: Cervical pain (neck)  Radiculopathy, cervical region  Other symptoms and signs involving the  musculoskeletal system    Problem List Patient Active Problem List   Diagnosis Date Noted  . Abdominal pain, left lower quadrant 10/19/2012  . GERD (gastroesophageal reflux disease) 04/15/2012  . Gout 04/01/2011  . Hypertension 04/01/2011  . Elevated cholesterol with high triglycerides 04/01/2011    [name], OTR/L,CBIS  [phone]  09/11/2015, 11:36 AM  [facility] [address] [place], [place], [address] Phone: [phone]   Fax:  [phone]  Name: Dennis Miles MRN: [id number] Date of Birth: [birth year]

## 2015-09-15 ENCOUNTER — Ambulatory Visit (HOSPITAL_COMMUNITY): Payer: BLUE CROSS/BLUE SHIELD | Admitting: Specialist

## 2015-09-15 DIAGNOSIS — M5412 Radiculopathy, cervical region: Secondary | ICD-10-CM

## 2015-09-15 DIAGNOSIS — M542 Cervicalgia: Secondary | ICD-10-CM | POA: Diagnosis not present

## 2015-09-15 NOTE — Therapy (Addendum)
[place], [place], [address] Phone: [phone]   Fax:  [phone]  Occupational Therapy Treatment  Patient Details  Name: Dennis Miles MRN: [id number] Date of Birth: Jan 13, 1951 Referring Provider: Dr. Suella Broad  Encounter Date: 09/15/2015      OT End of Session - 09/15/15 1046    Visit Number 13   Number of Visits 18   Date for OT Re-Evaluation 09/28/15   Authorization Type BCBS    Authorization - Visit Number 13   Authorization - Number of Visits 30   OT Start Time 0905   OT Stop Time 0950   OT Time Calculation (min) 45 min   Activity Tolerance Patient tolerated treatment well   Behavior During Therapy [employer] for tasks assessed/performed      Past Medical History  Diagnosis Date  . Gout   . High cholesterol   . Hypertension   . GERD (gastroesophageal reflux disease)   . Anxiety   . Headache(784.0)   . Hiatal hernia     Past Surgical History  Procedure Laterality Date  . Sinus sugery    . Appendectomy    . Esophagogastroduodenoscopy  04/17/2011    Procedure: ESOPHAGOGASTRODUODENOSCOPY (EGD);  Surgeon: Rogene Houston, MD;  [facility];  Service: Endoscopy;  Laterality: N/A;  8:30  . Colonoscopy N/A 10/23/2012    Procedure: COLONOSCOPY;  Surgeon: Rogene Houston, MD;  [facility];  Service: Endoscopy;  Laterality: N/A;  235  . Inguinal hernia repair Left 02/17/2013    Procedure: HERNIA REPAIR INGUINAL ADULT;  Surgeon: Jamesetta So, MD;  Location: [facility];  Service: General;  Laterality: Left;  . Insertion of mesh Left 02/17/2013    Procedure: INSERTION OF MESH;  Surgeon: Jamesetta So, MD;  Location: [facility];  Service: General;  Laterality: Left;    There were no vitals filed for this visit.      Subjective Assessment - 09/15/15 1041    Subjective  S:  I think we have hit it today.     Currently in Pain? Yes   Pain Score 3    Pain Location Neck   Pain  Orientation Left;Lateral   Pain Descriptors / Indicators Aching   Pain Type Acute pain   Pain Radiating Towards down left arm   Pain Onset More than a month ago   Pain Frequency Constant   Aggravating Factors  movement   Pain Relieving Factors rest   Effect of Pain on Daily Activities pain with activity   Multiple Pain Sites No            [facility] OT Assessment - 09/15/15 0001    Assessment   Diagnosis cervical disc disorder; left hand numbness                  OT Treatments/Exercises (OP) - 09/15/15 0001    Manual Therapy   Manual Therapy Myofascial release   Manual therapy comments Manual therapy completed prior to therapy exercises.   Myofascial Release Myofascial release to cervical region, bilateral upper trapezius, scapular regions, sternocleidomastoid from origin to insertion, platysmus region to decrease restrictions and improve pain free mobility.    Manual Traction Gentle manual cervical traction completed this session.                 OT Education - 09/15/15 1044    Education provided Yes   Education Details SCM stretches and information about the  SCM muscle in general   Person(s) Educated Patient   Methods Explanation   Comprehension Verbalized understanding          OT Short Term Goals - 09/04/15 0931    OT SHORT TERM GOAL #1   Title Pt will be educated and independent in HEP.    Time 4   Period Weeks   OT SHORT TERM GOAL #2   Title Pt will return to prior level of functioning and independence in daily and leisure tasks.    Time 4   Period Weeks   Status On-going   OT SHORT TERM GOAL #3   Title Pt will increase left grip strength by 10# to increase ability to open jar lids and operate.    Time 4   Period Weeks   Status On-going   OT SHORT TERM GOAL #4   Title Pt will decrease fascial restrictions in LUE and cervical region from mod to min amounts or less to increase mobility in cervical region and LUE.    Time 4   Period Weeks    Status Partially Met   OT SHORT TERM GOAL #5   Title Pt will increase cervical A/ROM to WNL to increase ability to looking left/right when driving and during daily tasks.    Time 4   Period Weeks   Status Partially Met   OT SHORT TERM GOAL #6   Title Pt will decrease pain in cervical region and LUE to 2/10 or less during daily and leisure tasks.    Time 4   Period Weeks   Status On-going                  Plan - 09/15/15 1052    Clinical Impression Statement A: Patient has max restrictions in his left sternocleidomastoid muscle both lateral and anterior portions, max restrictions in spinal border of left scapula. Added SCM stretches to HEP.   Rehab Potential Good   OT Treatment/Interventions Self-care/ADL training;Passive range of motion;Patient/family education;Cryotherapy;Electrical Stimulation;Moist Heat;Therapeutic exercise;Manual Therapy;Therapeutic activities   Plan P: Focus on SCM of left cervial region MFR to decrease pain and improve mobility.      Patient will benefit from skilled therapeutic intervention in order to improve the following deficits and impairments:  Decreased strength, Pain, Impaired sensation, Impaired UE functional use, Decreased activity tolerance, Decreased range of motion, Increased fascial restricitons, Impaired flexibility  Visit Diagnosis: Cervicalgia  Radiculopathy of cervical region    Problem List Patient Active Problem List   Diagnosis Date Noted  . Abdominal pain, left lower quadrant 10/19/2012  . GERD (gastroesophageal reflux disease) 04/15/2012  . Gout 04/01/2011  . Hypertension 04/01/2011  . Elevated cholesterol with high triglycerides 04/01/2011    Vangie Bicker, OTR/L [phone]  09/15/2015, 10:52 AM  [facility] [place] [address] [place], [place], [address] Phone: [phone]   Fax:  [phone]  Name: Dennis Miles MRN: [id number] Date of Birth: 1950/06/24

## 2015-09-15 NOTE — Patient Instructions (Signed)
(  Home) Stabilization: Posterior Quadrant Reach    Sit with back support, knees bent, feet flat. Chin tucked, breathing in, look up and back, reaching up with left arm. Breathe out and hold position ____ seconds. Repeat ____ times per set. Do ____ sets per session. Do ____ sessions per week. Use ____ lb weight.  Copyright  VHI. All rights reserved.       Sternocleidomastoid is named for its attachments to the bones in the neck. The SCM muscle has two parts in the front attachment - on the top of the sternum and on the clavicle. The SCM muscle ends at the mastoid process behind your jaw bone. When SCM on both sides of the neck work together the movement is neck flexion or bringing your chin down towards your chest. When SCM is working on one side it brings your head towards the same side shoulder and rotates to the opposite side.  Why do you want to perform neck exercises and stretches for the SCM? By stretching the SCM this neck exercise will help loosen the front and side of the neck. This will prevent any forward and tilted head from forming. By keeping the SCM flexible you will have greater neck range of motion. Another benefit of having the SCM muscle loose is that you avoid any compression on blood vessels or any nerves.  To begin SCM neck exercises as a stretch, sit upright in a chair. Have your feet flat on the floor, your shoulders relaxed, your hands resting on your lap, look straight ahead and perform a chin tuck. This SCM neck exercise will be felt on one side at a time. You will start with the right side and then will switch to the left side for these neck exercises. Take a breath in and bring your left ear towards your shoulder about 45 degrees be sure you are still looking forward. Now turn your head so that your nose is pointing towards the ceiling about a 45 degree angle. Your eyes should be focus up towards the ceiling. Breathe out and hold for 30 seconds. You should feel a stretch  on the right front and side of the neck. You can bring your right hand down and reach towards the floor to get more of a stretch. Slowly bring your head back to the starting position. Now reverse direction to stretch the left SCM. Repeat these neck exercises three times. There should be no pain when you perform these SCM stretch neck exercises. You should feel a tolerable stretch. If you find you get dizzy, release a little bit on the stretch. If you find that the SCM stretch is too much, limit the amount of neck motion on both the side bending and rotation of the head. These SCM stretch neck exercises should relieve the neck tension and help improve your neck posture.

## 2015-09-19 ENCOUNTER — Ambulatory Visit (HOSPITAL_COMMUNITY): Payer: BLUE CROSS/BLUE SHIELD

## 2015-09-19 ENCOUNTER — Encounter (HOSPITAL_COMMUNITY): Payer: Self-pay

## 2015-09-19 DIAGNOSIS — M542 Cervicalgia: Secondary | ICD-10-CM

## 2015-09-19 DIAGNOSIS — M5412 Radiculopathy, cervical region: Secondary | ICD-10-CM

## 2015-09-19 DIAGNOSIS — R29898 Other symptoms and signs involving the musculoskeletal system: Secondary | ICD-10-CM

## 2015-09-19 NOTE — Therapy (Addendum)
[place], [place], [address] Phone: [phone]   Fax:  [phone]  Occupational Therapy Treatment  Patient Details  Name: Dennis Miles MRN: [id number] Date of Birth: 02/25/1951 Referring Provider: Dr. Suella Broad  Encounter Date: 09/19/2015      OT End of Session - 09/19/15 1207    Visit Number 14   Number of Visits 18   Date for OT Re-Evaluation 09/28/15   Authorization Type BCBS    Authorization - Visit Number 14   Authorization - Number of Visits 30   OT Start Time 0902   OT Stop Time 0950   OT Time Calculation (min) 48 min   Activity Tolerance Patient tolerated treatment well   Behavior During Therapy [facility] for tasks assessed/performed      Past Medical History  Diagnosis Date  . Gout   . High cholesterol   . Hypertension   . GERD (gastroesophageal reflux disease)   . Anxiety   . Headache(784.0)   . Hiatal hernia     Past Surgical History  Procedure Laterality Date  . Sinus sugery    . Appendectomy    . Esophagogastroduodenoscopy  04/17/2011    Procedure: ESOPHAGOGASTRODUODENOSCOPY (EGD);  Surgeon: Rogene Houston, MD;  [facility];  Service: Endoscopy;  Laterality: N/A;  8:30  . Colonoscopy N/A 10/23/2012    Procedure: COLONOSCOPY;  Surgeon: Rogene Houston, MD;  [facility];  Service: Endoscopy;  Laterality: N/A;  235  . Inguinal hernia repair Left 02/17/2013    Procedure: HERNIA REPAIR INGUINAL ADULT;  Surgeon: Jamesetta So, MD;  Location: [facility];  Service: General;  Laterality: Left;  . Insertion of mesh Left 02/17/2013    Procedure: INSERTION OF MESH;  Surgeon: Jamesetta So, MD;  Location: [facility];  Service: General;  Laterality: Left;    There were no vitals filed for this visit.      Subjective Assessment - 09/19/15 0914    Subjective  S: After my last session it was painful the day of and the day after but then it got better.   Currently in Pain?  No/denies            [facility] OT Assessment - 09/19/15 0936    Assessment   Diagnosis cervical disc disorder; left hand numbness   Precautions   Precautions Other (comment)   Precaution Comments Per Dr. Veronia Beets continue to treat cervical region only. Do not touch left wrist until his follow up appointment.                   OT Treatments/Exercises (OP) - 09/19/15 0001    Exercises   Exercises Shoulder;Neck   Neck Exercises: Stretches   Upper Trapezius Stretch 3 reps;10 seconds   Neck Exercises: Seated   W Back Other reps (comment)  12X   Shoulder Rolls Backwards;Forwards;10 reps   Other Seated Exercise Retraction 10X   Neck Exercises: Prone   Axial Exentsion 10 reps   Shoulder Exercises: Standing   Extension Theraband;12 reps   Theraband Level (Shoulder Extension) Level 3 (Green)   Row [employer] reps   Theraband Level (Shoulder Row) Level 3 (Green)   Retraction Theraband;12 reps   Theraband Level (Shoulder Retraction) Level 3 (Green)   Shoulder Exercises: ROM/Strengthening   UBE (Upper Arm Bike) Level 2 reverse 2'   Manual Therapy   Manual Therapy Myofascial release   Manual therapy comments Manual therapy completed prior  to therapy exercises.   Myofascial Release Myofascial release to cervical region, bilateral upper trapezius, scapular regions, sternocleidomastoid from origin to insertion, platysmus region to decrease restrictions and improve pain free mobility.    Manual Traction --                  OT Short Term Goals - 09/04/15 0931    OT SHORT TERM GOAL #1   Title Pt will be educated and independent in HEP.    Time 4   Period Weeks   OT SHORT TERM GOAL #2   Title Pt will return to prior level of functioning and independence in daily and leisure tasks.    Time 4   Period Weeks   Status On-going   OT SHORT TERM GOAL #3   Title Pt will increase left grip strength by 10# to increase ability to open jar lids and operate.    Time 4    Period Weeks   Status On-going   OT SHORT TERM GOAL #4   Title Pt will decrease fascial restrictions in LUE and cervical region from mod to min amounts or less to increase mobility in cervical region and LUE.    Time 4   Period Weeks   Status Partially Met   OT SHORT TERM GOAL #5   Title Pt will increase cervical A/ROM to WNL to increase ability to looking left/right when driving and during daily tasks.    Time 4   Period Weeks   Status Partially Met   OT SHORT TERM GOAL #6   Title Pt will decrease pain in cervical region and LUE to 2/10 or less during daily and leisure tasks.    Time 4   Period Weeks   Status On-going                  Plan - 09/19/15 1207    Clinical Impression Statement A: Patient with mod restrictions in his left strenocleidomastoid muscle with good results after myofascial release. Attempted neck retraction exercise to work muscle with patient reporting increase pain and discomfort.    Plan P: Continue to focus on sternocleidomastoid and left cerivcal region to decrease pain and improve mobility.       Patient will benefit from skilled therapeutic intervention in order to improve the following deficits and impairments:     Visit Diagnosis: Cervicalgia  Radiculopathy of cervical region  Other symptoms and signs involving the musculoskeletal system    Problem List Patient Active Problem List   Diagnosis Date Noted  . Abdominal pain, left lower quadrant 10/19/2012  . GERD (gastroesophageal reflux disease) 04/15/2012  . Gout 04/01/2011  . Hypertension 04/01/2011  . Elevated cholesterol with high triglycerides 04/01/2011    Dennis Miles, OTR/L,CBIS  [phone]  09/19/2015, 12:10 PM  [place] [address] [place], [place], [address] Phone: [phone]   Fax:  [phone]  Name: Dennis Miles MRN: [id number] Date of Birth: [birth year]

## 2015-09-21 ENCOUNTER — Ambulatory Visit (HOSPITAL_COMMUNITY): Payer: BLUE CROSS/BLUE SHIELD

## 2015-09-21 ENCOUNTER — Encounter (HOSPITAL_COMMUNITY): Payer: Self-pay

## 2015-09-21 DIAGNOSIS — R29898 Other symptoms and signs involving the musculoskeletal system: Secondary | ICD-10-CM

## 2015-09-21 DIAGNOSIS — M542 Cervicalgia: Secondary | ICD-10-CM | POA: Diagnosis not present

## 2015-09-21 DIAGNOSIS — M5412 Radiculopathy, cervical region: Secondary | ICD-10-CM

## 2015-09-21 NOTE — Therapy (Addendum)
[place], [place], [address] Phone: [phone]   Fax:  [phone]  Occupational Therapy Treatment  Patient Details  Name: Dennis Miles MRN: [id number] Date of Birth: 12-Feb-1951 Referring Provider: Dr. Suella Broad  Encounter Date: 09/21/2015      OT End of Session - 09/21/15 0942    Visit Number 15   Number of Visits 18   Date for OT Re-Evaluation 09/28/15   Authorization Type BCBS - 30 visit limit per calendar year.   Authorization - Visit Number 14   Authorization - Number of Visits 30   OT Start Time [phone]   OT Stop Time 0930   OT Time Calculation (min) 40 min   Activity Tolerance Patient tolerated treatment well   Behavior During Therapy [facility] for tasks assessed/performed      Past Medical History  Diagnosis Date  . Gout   . High cholesterol   . Hypertension   . GERD (gastroesophageal reflux disease)   . Anxiety   . Headache(784.0)   . Hiatal hernia     Past Surgical History  Procedure Laterality Date  . Sinus sugery    . Appendectomy    . Esophagogastroduodenoscopy  04/17/2011    Procedure: ESOPHAGOGASTRODUODENOSCOPY (EGD);  Surgeon: Rogene Houston, MD;  [facility];  Service: Endoscopy;  Laterality: N/A;  8:30  . Colonoscopy N/A 10/23/2012    Procedure: COLONOSCOPY;  Surgeon: Rogene Houston, MD;  [facility];  Service: Endoscopy;  Laterality: N/A;  235  . Inguinal hernia repair Left 02/17/2013    Procedure: HERNIA REPAIR INGUINAL ADULT;  Surgeon: Jamesetta So, MD;  Location: [facility];  Service: General;  Laterality: Left;  . Insertion of mesh Left 02/17/2013    Procedure: INSERTION OF MESH;  Surgeon: Jamesetta So, MD;  Location: [facility];  Service: General;  Laterality: Left;    There were no vitals filed for this visit.      Subjective Assessment - 09/21/15 0859    Pain Score 2    Pain Location Neck   Pain Orientation Left;Lateral   Pain Descriptors /  Indicators Aching   Pain Type Acute pain   Pain Onset Today   Pain Frequency Intermittent   Aggravating Factors  Moving it   Pain Relieving Factors rest   Effect of Pain on Daily Activities pain during activity   Multiple Pain Sites No            [facility] OT Assessment - 09/21/15 0901    Assessment   Diagnosis cervical disc disorder; left hand numbness   Precautions   Precautions Other (comment)   Precaution Comments Per Dr. Veronia Beets continue to treat cervical region only. Do not touch left wrist until his follow up appointment.                   OT Treatments/Exercises (OP) - 09/21/15 0902    Exercises   Exercises Shoulder;Neck   Neck Exercises: Prone   Axial Exentsion 15 reps   Shoulder Exercises: Prone   Other Prone Exercises Scapular raises; 15X   Shoulder Exercises: Standing   Protraction Theraband;15 reps   Theraband Level (Shoulder Protraction) Level 3 (Green)   Extension Theraband;15 reps   Theraband Level (Shoulder Extension) Level 3 (Green)   Manual Therapy   Manual Therapy Myofascial release   Manual therapy comments Manual therapy completed prior to therapy exercises.   Myofascial Release Myofascial release to cervical  region, bilateral upper trapezius, scapular regions, sternocleidomastoid from origin to insertion, platysmus region to decrease restrictions and improve pain free mobility.    Manual Traction --                OT Education - 09/21/15 0939    Education provided Yes   Education Details Discussed possible causes of nerve compression in cervical region that could be related to hand pain and weakness. Encouraged patient to dicuss with cervical neurologist. Obtained Release of Information to send MD pasted OT notes for upcoming appointment.    Person(s) Educated Patient   Methods Explanation;Handout   Comprehension Verbalized understanding          OT Short Term Goals - 09/04/15 0931    OT SHORT TERM GOAL #1   Title Pt will be  educated and independent in HEP.    Time 4   Period Weeks   OT SHORT TERM GOAL #2   Title Pt will return to prior level of functioning and independence in daily and leisure tasks.    Time 4   Period Weeks   Status On-going   OT SHORT TERM GOAL #3   Title Pt will increase left grip strength by 10# to increase ability to open jar lids and operate.    Time 4   Period Weeks   Status On-going   OT SHORT TERM GOAL #4   Title Pt will decrease fascial restrictions in LUE and cervical region from mod to min amounts or less to increase mobility in cervical region and LUE.    Time 4   Period Weeks   Status Partially Met   OT SHORT TERM GOAL #5   Title Pt will increase cervical A/ROM to WNL to increase ability to looking left/right when driving and during daily tasks.    Time 4   Period Weeks   Status Partially Met   OT SHORT TERM GOAL #6   Title Pt will decrease pain in cervical region and LUE to 2/10 or less during daily and leisure tasks.    Time 4   Period Weeks   Status On-going                  Plan - 09/21/15 0944    Clinical Impression Statement A: See education section of note. Continued with manual therapy for tightness in sternocleidomastoid muscle. Pt with moderate fascial restrictions this session. Pt reports that he has an appointment with Cervical Neurologist on November 10, 2015 and has a follow up with Dr. Amedeo Plenty on 09/28/15.    Plan P: OT faxed patient's notes to Dr. Carloyn Manner and Gramig (09/21/15). Continue with current plan focusing on decreased fascial restrictions in sternocleidomastoid muscle and increasing cervical strength. Reassessment to be completed on 09/28/15.      Patient will benefit from skilled therapeutic intervention in order to improve the following deficits and impairments:     Visit Diagnosis: Cervicalgia  Radiculopathy of cervical region  Other symptoms and signs involving the musculoskeletal system    Problem List Patient Active Problem List    Diagnosis Date Noted  . Abdominal pain, left lower quadrant 10/19/2012  . GERD (gastroesophageal reflux disease) 04/15/2012  . Gout 04/01/2011  . Hypertension 04/01/2011  . Elevated cholesterol with high triglycerides 04/01/2011    [name], OTR/L,CBIS  [phone]  09/21/2015, 10:02 AM  [facility] [address] [place], [place], [address] Phone: [phone]   Fax:  [phone]  Name:  Dennis Miles MRN: [id number] Date of Birth: 08/08/1950

## 2015-09-26 ENCOUNTER — Encounter (HOSPITAL_COMMUNITY): Payer: BLUE CROSS/BLUE SHIELD

## 2015-09-28 ENCOUNTER — Encounter (HOSPITAL_COMMUNITY): Payer: BLUE CROSS/BLUE SHIELD

## 2015-10-03 ENCOUNTER — Encounter (HOSPITAL_COMMUNITY): Payer: Self-pay

## 2015-10-03 ENCOUNTER — Ambulatory Visit (HOSPITAL_COMMUNITY): Payer: BLUE CROSS/BLUE SHIELD | Attending: Physical Medicine and Rehabilitation

## 2015-10-03 DIAGNOSIS — M5412 Radiculopathy, cervical region: Secondary | ICD-10-CM

## 2015-10-03 DIAGNOSIS — R29898 Other symptoms and signs involving the musculoskeletal system: Secondary | ICD-10-CM

## 2015-10-03 DIAGNOSIS — M542 Cervicalgia: Secondary | ICD-10-CM | POA: Diagnosis present

## 2015-10-03 NOTE — Therapy (Signed)
[place], [place], [address] Phone: [phone]   Fax:  [phone]  Occupational Therapy Treatment And reassessment Patient Details  Name: Dennis Miles MRN: [id number] Date of Birth: 04/21/1951 Referring Provider: Dr. Suella Broad  Encounter Date: 10/03/2015      OT End of Session - 10/03/15 0944    Visit Number 16   Number of Visits 18   Authorization Type [employer] - 30 visit limit per calendar year.   Authorization - Visit Number 16   Authorization - Number of Visits 30   OT Start Time 0815  reassessment   OT Stop Time 0850   OT Time Calculation (min) 35 min   Activity Tolerance Patient tolerated treatment well   Behavior During Therapy [facility] for tasks assessed/performed      Past Medical History  Diagnosis Date  . Gout   . High cholesterol   . Hypertension   . GERD (gastroesophageal reflux disease)   . Anxiety   . Headache(784.0)   . Hiatal hernia     Past Surgical History  Procedure Laterality Date  . Sinus sugery    . Appendectomy    . Esophagogastroduodenoscopy  04/17/2011    Procedure: ESOPHAGOGASTRODUODENOSCOPY (EGD);  Surgeon: Rogene Houston, MD;  [facility];  Service: Endoscopy;  Laterality: N/A;  8:30  . Colonoscopy N/A 10/23/2012    Procedure: COLONOSCOPY;  Surgeon: Rogene Houston, MD;  [facility];  Service: Endoscopy;  Laterality: N/A;  235  . Inguinal hernia repair Left 02/17/2013    Procedure: HERNIA REPAIR INGUINAL ADULT;  Surgeon: Jamesetta So, MD;  Location: [facility];  Service: General;  Laterality: Left;  . Insertion of mesh Left 02/17/2013    Procedure: INSERTION OF MESH;  Surgeon: Jamesetta So, MD;  Location: [facility];  Service: General;  Laterality: Left;    There were no vitals filed for this visit.      Subjective Assessment - 10/03/15 6568    Subjective  S: Dr. Veronia Beets says I need to have surgery on my left arm.   Special Tests FOTO score:  67/100   Currently in Pain? Yes   Pain Score 3    Pain Location Neck   Pain Orientation Left;Lateral   Pain Descriptors / Indicators Aching   Pain Type Acute pain   Pain Onset In the past 7 days   Pain Frequency Intermittent   Multiple Pain Sites No            [facility] OT Assessment - 10/03/15 0823    Assessment   Diagnosis cervical disc disorder; right hand numbness   Precautions   Precautions Other (comment)   Precaution Comments Per Dr. Veronia Beets continue to treat cervical region only. Do not touch left wrist until his follow up appointment.    AROM   Overall AROM Comments All shoulder A/ROM is [facility]   AROM Assessment Site Cervical   Cervical Flexion 80  previous 80   Cervical Extension 75  previous 75   Cervical - Right Side Bend 60  previous 35   Cervical - Left Side Bend 55  previous 50   Cervical - Right Rotation 90  previous 67   Cervical - Left Rotation 90  previous 80   Strength   Left Hand Grip (lbs) 70  previous 55                  OT Treatments/Exercises (OP) - 10/03/15  0823    Exercises   Exercises Shoulder;Neck   Neck Exercises: Stretches   Upper Trapezius Stretch 3 reps;10 seconds   Levator Stretch 3 reps;10 seconds   Neck Exercises: Supine   Cervical Rotation Both;5 reps   Lateral Flexion Both;5 reps   Manual Therapy   Manual Therapy Myofascial release   Manual therapy comments Manual therapy completed prior to therapy exercises.   Myofascial Release Myofascial release to cervical region, bilateral upper trapezius, scapular regions, sternocleidomastoid from origin to insertion, platysmus region to decrease restrictions and improve pain free mobility.    Manual Traction Gentle manual cervical traction completed this session.                 OT Education - 10/03/15 [phone]    Education provided Yes   Education Details Discussed goals and discharge plan. recommendations made regarding exercises to continue to complete for HEP.     Person(s) Educated Patient   Methods Explanation   Comprehension Verbalized understanding          OT Short Term Goals - 10/03/15 7591    OT SHORT TERM GOAL #1   Title Pt will be educated and independent in [place].    Time 4   Period Weeks   OT SHORT TERM GOAL #2   Title Pt will return to prior level of functioning and independence in daily and leisure tasks.    Time 4   Period Weeks   Status Achieved   OT SHORT TERM GOAL #3   Title Pt will increase left grip strength by 10# to increase ability to open jar lids and operate.    Time 4   Period Weeks   Status Achieved   OT SHORT TERM GOAL #4   Title Pt will decrease fascial restrictions in LUE and cervical region from mod to min amounts or less to increase mobility in cervical region and LUE.    Time 4   Period Weeks   Status Achieved   OT SHORT TERM GOAL #5   Title Pt will increase cervical A/ROM to WNL to increase ability to looking left/right when driving and during daily tasks.    Time 4   Period Weeks   Status Achieved   OT SHORT TERM GOAL #6   Title Pt will decrease pain in cervical region and LUE to 2/10 or less during daily and leisure tasks.    Time 4   Period Weeks   Status Partially Met                  Plan - 10/03/15 0944    Clinical Impression Statement A: Reassessment completed this date. patient has shown improvement with cervical ROM measurements and Left hand strength. Patient has met all therapy goals with the exception of 1 pain goal which is met partially as it depends on whay activity is completed that may cause pain level to increase. Patient reports that he is aware of what activitie will aggrevate his pain and he will avoid them if possible.    Plan P: D/C from therapy for HEP.    Consulted and Agree with Plan of Care Patient      Patient will benefit from skilled therapeutic intervention in order to improve the following deficits and impairments:  Decreased strength, Pain, Impaired  sensation, Impaired UE functional use, Decreased activity tolerance, Decreased range of motion, Increased fascial restricitons, Impaired flexibility  Visit Diagnosis: Cervicalgia  Radiculopathy of cervical region  Other symptoms and signs  involving the musculoskeletal system    Problem List Patient Active Problem List   Diagnosis Date Noted  . Abdominal pain, left lower quadrant 10/19/2012  . GERD (gastroesophageal reflux disease) 04/15/2012  . Gout 04/01/2011  . Hypertension 04/01/2011  . Elevated cholesterol with high triglycerides 04/01/2011  OCCUPATIONAL THERAPY DISCHARGE SUMMARY  Visits from Start of Care: 16  Current functional level related to goals / functional outcomes: See above   Remaining deficits: See above   Education / Equipment: See above Plan: Patient agrees to discharge.  Patient goals were met. Patient is being discharged due to meeting the stated rehab goals.  ?????       [name], OTR/L,CBIS  [phone]  10/03/2015, 10:09 AM  [facility] [address] [place], [place], [address] Phone: [phone]   Fax:  [phone]  Name: Dennis Miles MRN: [id number] Date of Birth: 12-08-1950

## 2015-10-05 ENCOUNTER — Encounter (HOSPITAL_COMMUNITY): Payer: BLUE CROSS/BLUE SHIELD

## 2015-10-10 ENCOUNTER — Encounter (HOSPITAL_COMMUNITY): Payer: BLUE CROSS/BLUE SHIELD

## 2015-10-12 ENCOUNTER — Encounter (HOSPITAL_COMMUNITY): Payer: BLUE CROSS/BLUE SHIELD

## 2015-10-16 ENCOUNTER — Encounter (HOSPITAL_COMMUNITY): Payer: BLUE CROSS/BLUE SHIELD | Admitting: Occupational Therapy

## 2015-10-19 ENCOUNTER — Encounter (HOSPITAL_COMMUNITY): Payer: BLUE CROSS/BLUE SHIELD

## 2015-10-24 ENCOUNTER — Encounter (HOSPITAL_COMMUNITY): Payer: BLUE CROSS/BLUE SHIELD

## 2015-10-26 ENCOUNTER — Encounter (HOSPITAL_COMMUNITY): Payer: BLUE CROSS/BLUE SHIELD

## 2015-10-31 ENCOUNTER — Encounter (HOSPITAL_COMMUNITY): Payer: BLUE CROSS/BLUE SHIELD

## 2015-11-04 ENCOUNTER — Other Ambulatory Visit (INDEPENDENT_AMBULATORY_CARE_PROVIDER_SITE_OTHER): Payer: Self-pay | Admitting: Internal Medicine

## 2015-11-10 ENCOUNTER — Other Ambulatory Visit (HOSPITAL_COMMUNITY): Payer: Self-pay | Admitting: Neurosurgery

## 2015-11-10 ENCOUNTER — Ambulatory Visit (HOSPITAL_COMMUNITY)
Admission: RE | Admit: 2015-11-10 | Discharge: 2015-11-10 | Disposition: A | Discharge status: Home / Self Care | Payer: BLUE CROSS/BLUE SHIELD | Source: Ambulatory Visit | Attending: Neurosurgery | Admitting: Neurosurgery

## 2015-11-10 DIAGNOSIS — M4712 Other spondylosis with myelopathy, cervical region: Secondary | ICD-10-CM | POA: Diagnosis not present

## 2016-04-30 ENCOUNTER — Encounter (INDEPENDENT_AMBULATORY_CARE_PROVIDER_SITE_OTHER): Payer: Self-pay

## 2016-04-30 ENCOUNTER — Encounter (INDEPENDENT_AMBULATORY_CARE_PROVIDER_SITE_OTHER): Payer: Self-pay | Admitting: Internal Medicine

## 2016-05-28 HISTORY — PX: HAND SURGERY: SHX662

## 2016-06-26 ENCOUNTER — Encounter (INDEPENDENT_AMBULATORY_CARE_PROVIDER_SITE_OTHER): Payer: Self-pay | Admitting: Internal Medicine

## 2016-06-26 ENCOUNTER — Ambulatory Visit (INDEPENDENT_AMBULATORY_CARE_PROVIDER_SITE_OTHER): Payer: BLUE CROSS/BLUE SHIELD | Admitting: Internal Medicine

## 2016-06-26 VITALS — BP 104/78 | HR 72 | Temp 97.8°F | Ht 70.0 in | Wt 155.5 lb

## 2016-06-26 DIAGNOSIS — K219 Gastro-esophageal reflux disease without esophagitis: Secondary | ICD-10-CM

## 2016-06-26 NOTE — Progress Notes (Signed)
Subjective:    Patient ID: Dennis Miles, male    DOB: [birth year], 66 y.o.   MRN: [id number]  HPI  Here today for f/u of his GERD. Hx of  esophagitis. Hx of esophageal stricture and dilated back in 2012.  States his appetite has remained good. He has stopped eating redmeats,seafoods, and pork because of his gout. He says he has a gout flare about every 2 months and this is very upsetting for him. His appetite has remained good. He has maintained his weight. No dysphagia. BMs are normal. No melena or BRRB.   Carpal tunnel repair this year (left hand)  04/07/2011 EGD/ED:  Indications: Patient is 66 year-old Caucasian male with chronic GERD maintenance PPI was been experiencing recurrent anus upper sternal area as well as Adam's apple and regurgitation. He is doing better with PPI. He has history of esophageal stricture which was last dilated in may 2002. At the present time he denies dysphagia. Impression:  No evidence of erosive esophagitis.  Distal esophageal stricture dilated by passing 54 [place] Maloney dilator.  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  Biopsy: No eosinophilic esophagitis.     Review of Systems Past Medical History:  Diagnosis Date  . Anxiety   . GERD (gastroesophageal reflux disease)   . Gout   . Headache(784.0)   . Hiatal hernia   . High cholesterol   . Hypertension     Past Surgical History:  Procedure Laterality Date  . APPENDECTOMY    . COLONOSCOPY N/A 10/23/2012   Procedure: COLONOSCOPY;  Surgeon: Rogene Houston, MD;  [facility];  Service: Endoscopy;  Laterality: N/A;  235  . ESOPHAGOGASTRODUODENOSCOPY  04/17/2011   Procedure: ESOPHAGOGASTRODUODENOSCOPY (EGD);  Surgeon: Rogene Houston, MD;  [facility];  Service: Endoscopy;  Laterality: N/A;  8:30  . INGUINAL  HERNIA REPAIR Left 02/17/2013   Procedure: HERNIA REPAIR INGUINAL ADULT;  Surgeon: Jamesetta So, MD;  Location: [facility];  Service: General;  Laterality: Left;  . INSERTION OF MESH Left 02/17/2013   Procedure: INSERTION OF MESH;  Surgeon: Jamesetta So, MD;  Location: [facility];  Service: General;  Laterality: Left;  . sinus sugery      Allergies  Allergen Reactions  . Codeine Nausea Only    Current Outpatient Prescriptions on File Prior to Visit  Medication Sig Dispense Refill  . cetirizine (ZYRTEC) 10 MG tablet Take 10 mg by mouth daily.    . colchicine 0.6 MG tablet Take 0.6 mg by mouth as needed.     . fexofenadine (ALLEGRA) 180 MG tablet Take 180 mg by mouth daily.    . fluticasone (FLONASE) 50 MCG/ACT nasal spray Place 1 spray into the nose daily as needed. For congestion    . lisinopril (PRINIVIL,ZESTRIL) 10 MG tablet Take 10 mg by mouth daily.    . pantoprazole (PROTONIX) 40 MG tablet TAKE ONE TABLET BY MOUTH ONCE DAILY 30 tablet 7  . zolpidem (AMBIEN) 10 MG tablet Take 10 mg by mouth at bedtime as needed. For sleep    . febuxostat (ULORIC) 40 MG tablet Take 40 mg by mouth daily.    . naproxen sodium (ALEVE) 220 MG tablet Take 220 mg by mouth 2 (two) times daily as needed.    . pantoprazole (PROTONIX) 40 MG tablet TAKE ONE TABLET BY MOUTH THIRTY MINUTES BEFORE BREAKFAST AND ONE TAB THIRTY MINUTES BEFORE SUPPER (Patient not taking: Reported on 06/26/2016) 60 tablet 11  No current facility-administered medications on file prior to visit.        Objective:   Physical Exam Blood pressure 104/78, pulse 72, temperature 97.8 F (36.6 C), height 5\' 10"  (1.778 m), weight 155 lb 8 oz (70.5 kg). Alert and oriented. Skin warm and dry. Oral mucosa is moist.   . Sclera anicteric, conjunctivae is pink. Thyroid not enlarged. No cervical lymphadenopathy. Lungs clear. Heart regular rate and rhythm.  Abdomen is soft. Bowel sounds are positive. No hepatomegaly. No abdominal masses felt. No  tenderness.  No edema to lower extremities.         Assessment & Plan:     GERD controlled with Protonix daily.  He is doing well. If any problems, call our office. He will have OV in 1 year.

## 2016-06-26 NOTE — Patient Instructions (Signed)
OV in 1 year.  

## 2016-09-23 ENCOUNTER — Other Ambulatory Visit (INDEPENDENT_AMBULATORY_CARE_PROVIDER_SITE_OTHER): Payer: Self-pay | Admitting: Internal Medicine

## 2016-09-23 NOTE — Progress Notes (Signed)
Office Visit Note  Patient: Dennis Miles             Date of Birth: 1950/08/26           MRN: [id number]             PCP: Wende Neighbors, MD Referring: Gypsy Decant, DC Visit Date: 09/26/2016 Occupation: '@GUAROCC'$ @    Subjective:  Gout   History of Present Illness: Dennis Miles is a 66 y.o. male with history of gout. He is been seen in consultation per request of his chiropractor Dr. Gypsy Decant. According to patient his symptoms started about 20 years ago with right first toe pain. He was diagnosed with gout and was treated with colchicine off and on. He also took allopurinol off and on over the years. He states initially he is to have 2 episodes per year. About 5 years ago the frequency increased and the flares move to other joints which include wrist joints, feet, knee joints. Approximately 2 years ago he was started on allopurinol. Recalls being on 100 mg of allopurinol for about 3 months but due to frequent flares it was discontinued. Then he was tried on Zorampic but discontinued due to side effects. Uloric was started about 4 months ago he was on 40 mg for 2 months and then increased to 80 mg. He continued to take colchicine twice a day and took about 8 tablets per day for a flare. He was also given prednisone for flares. He recalls about having 2 episodes a month in the last 6 months. In December 2017 he had a tophus removed from his left fifth finger and had carpal tunnel release by Dr. Amedeo Plenty. Since February 2018 he is been having right to recurrent flare. He states that he drinks about 2 beers on daily basis. He has not had red meat since October 2017. He denies intake of shellfish. He's also had neck pain for the last 2 years for which she seen chiropractor and been to physical therapy. He recalls seeing Dr. Carloyn Manner and had MRI. He states surgery was not advised. He also recalls that his last uric acid was approximately around 2 in February 2018. He is currently having some discomfort  in his right great toe.  Activities of Daily Living:  Patient reports morning stiffness for 0 minute.   Patient Denies nocturnal pain.  Difficulty dressing/grooming: Denies Difficulty climbing stairs: Reports Difficulty getting out of chair: Denies Difficulty using hands for taps, buttons, cutlery, and/or writing: Denies   Review of Systems  Constitutional: Positive for fatigue. Negative for night sweats and weakness ( ).  HENT: Positive for mouth dryness. Negative for mouth sores and nose dryness.   Eyes: Negative for redness and dryness.  Respiratory: Negative for shortness of breath and difficulty breathing.   Cardiovascular: Positive for hypertension. Negative for chest pain, palpitations, irregular heartbeat and swelling in legs/feet.  Gastrointestinal: Negative for constipation and diarrhea.  Endocrine: Negative for increased urination.  Musculoskeletal: Positive for arthralgias and joint pain. Negative for joint swelling, myalgias, muscle weakness, morning stiffness, muscle tenderness and myalgias.  Skin: Negative for color change, rash, hair loss, nodules/bumps, skin tightness, ulcers and sensitivity to sunlight.  Allergic/Immunologic: Negative for susceptible to infections.  Neurological: Negative for dizziness, fainting, memory loss and night sweats.  Hematological: Negative for swollen glands.  Psychiatric/Behavioral: Positive for sleep disturbance. Negative for depressed mood. The patient is not nervous/anxious.     PMFS History:  Patient Active Problem List   Diagnosis  Date Noted  . Primary insomnia 09/26/2016  . DJD (degenerative joint disease), cervical 09/26/2016  . 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 Abdominal pain, left lower quadrant 10/19/2012  . GERD (gastroesophageal reflux disease) 04/15/2012  . Gout 04/01/2011  . Hypertension 04/01/2011  . Elevated cholesterol with high triglycerides 04/01/2011    Past Medical History:    Diagnosis Date  . Anxiety   . GERD (gastroesophageal reflux disease)   . Gout   . Headache(784.0)   . Hiatal hernia   . High cholesterol   . Hypertension     Family History  Problem Relation Age of Onset  . Colon cancer Neg Hx    Past Surgical History:  Procedure Laterality Date  . APPENDECTOMY    . COLONOSCOPY N/A 10/23/2012   Procedure: COLONOSCOPY;  Surgeon: Malissa Hippo, MD;  [facility];  Service: Endoscopy;  Laterality: N/A;  235  . ESOPHAGOGASTRODUODENOSCOPY  04/17/2011   Procedure: ESOPHAGOGASTRODUODENOSCOPY (EGD);  Surgeon: Malissa Hippo, MD;  [facility];  Service: Endoscopy;  Laterality: N/A;  8:30  . HAND SURGERY  05/28/2016  . INGUINAL HERNIA REPAIR Left 02/17/2013   Procedure: HERNIA REPAIR INGUINAL ADULT;  Surgeon: Dalia Heading, MD;  Location: [facility];  Service: General;  Laterality: Left;  . INSERTION OF MESH Left 02/17/2013   Procedure: INSERTION OF MESH;  Surgeon: Dalia Heading, MD;  Location: [facility];  Service: General;  Laterality: Left;  . sinus sugery     Social History   Social History Narrative  . No narrative on file     Objective: Vital Signs: BP 121/72   Pulse 70   Resp 14   Ht 5\' 10"  (1.778 m)   Wt 152 lb (68.9 kg)   BMI 21.81 kg/m    Physical Exam  Constitutional: He is oriented to person, place, and time. He appears well-developed and well-nourished.  HENT:  Head: Normocephalic and atraumatic.  Eyes: Conjunctivae and EOM are normal. Pupils are equal, round, and reactive to light.  Neck: Normal range of motion. Neck supple.  Cardiovascular: Normal rate, regular rhythm and normal heart sounds.   Pulmonary/Chest: Effort normal and breath sounds normal.  Abdominal: Soft. Bowel sounds are normal.  Neurological: He is alert and oriented to person, place, and time.  Skin: Skin is warm and dry. Capillary refill takes less than 2 seconds.  Psychiatric: He has a normal mood and affect. His behavior is normal.   Nursing note and vitals reviewed.    Musculoskeletal Exam: C-spine he has some discomfort with range of motion. Thoracic spine good range of motion. Lumbar spine he had difficulty bending over due to tight hamstrings. He is some discomfort with range of motion of his left shoulder. Elbow joints are good range of motion. Wrists joint MCPs PIPs DIPs with good range of motion. He had mild tenderness over left second MCP joint. Hip joints knee joints ankles MTPs PIPs with good range of motion. He had hammertoes bilaterally. He has some warmth and tenderness on palpation of his right first MTP joint.  CDAI Exam: No CDAI exam completed.    Investigation: No additional findings.   Imaging: Xr Foot 2 Views Left  Result Date: 09/26/2016 First MTP narrowing minimal PIP/DIP narrowing no erosive changes a small calcaneal spur was noted. Impression: These findings are consistent with mild osteoarthritis  Xr Foot 2 Views Right  Result Date: 09/26/2016 First MTP minimal narrowing minimal PIP/DIP narrowing no  erosive changes were noted. A small inferior calcaneal spur was noted. Impression these findings were consistent with mild osteoarthritis of the foot  Xr Hand 2 View Left  Result Date: 09/26/2016 Left second and third MCP joint narrowing PIP narrowing no erosive changes were noted to intercarpal radiocarpal joint space changes were noted. These findings are consistent with osteoarthritis and could be consistent with inflammatory arthritis  Xr Hand 2 View Right  Result Date: 09/26/2016 Right third MCP narrowing by hanging osteophytes was noted in the third MCP joint minimal PIP narrowing no intercarpal radiocarpal joint space narrowing no erosive changes. Impression these findings could be consistent with osteoarthritis and gout  Xr Knee 3 View Left  Result Date: 09/26/2016 Mild medial compartment narrowing, intercondylar osteophytes were noted. No chondrocalcinosis was noted. Mild  patellofemoral narrowing was noted. Impression: These findings are consistent with mild osteoarthritis and mild chondromalacia patella   Speciality Comments: No specialty comments available.    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 He's been having Episodes for last 20 years and more frequent now. He's been having persistent swelling in his right first MTP joint. He states his uric acid has been around 2.0 he's been taking Uloric now on regular basis. He's also been taking colchicine. We had detailed discussion regarding gout time. Dietary management and pharmacological management was discussed at length. I wonder about the compliance with the medications. I'm also concerned about his intake of alcohol. Stiffness from alcohol was discussed. I would also look for other causes of inflammatory arthritis today.  Pain in both hands - Plan: XR Hand 2 View Right, XR Hand 2 View Left, osteoarthritic changes were noted. Also right third and left second and third MCP narrowing was noted. Sedimentation rate, ANA, Rheumatoid factor, Cyclic citrul peptide antibody, IgG  Pain in both feet - Plan: XR Foot 2 Views Right, XR Foot 2 Views Left, mild osteoarthritic changes were noted. Uric acid, HLA-B27 antigen  Chronic pain of both knees - Plan: XR KNEE 3 VIEW RIGHT, XR KNEE 3 VIEW LEFT. X-ray showed mild osteoarthritis and mild chondral malacia patella  DJD (degenerative joint disease), cervical: MRI of his C-spine was reviewed.  Essential hypertension  Elevated cholesterol with high triglycerides  Gastroesophageal reflux disease without esophagitis  Primary insomnia  Other fatigue - Plan: CBC with Differential/Platelet, COMPLETE METABOLIC PANEL WITH GFR, Urinalysis, Routine w reflex microscopic    Orders: Orders Placed This Encounter    Procedures  . XR Hand 2 View Right  . XR Hand 2 View Left  . XR KNEE 3 VIEW RIGHT  . XR KNEE 3 VIEW LEFT  . XR Foot 2 Views Right  . XR Foot 2 Views Left  . CBC with Differential/Platelet  . COMPLETE METABOLIC PANEL WITH GFR  . Urinalysis, Routine w reflex microscopic  . Sedimentation rate  . ANA  . Rheumatoid factor  . Cyclic citrul peptide antibody, IgG  . Uric acid  . HLA-B27 antigen  . Ferritin  . Iron and TIBC  . Magnesium   No orders of the defined types were placed in this encounter.   Face-to-face time spent with patient was 50 minutes. 50% of time was spent in counseling and coordination of care.  Follow-Up Instructions: Return for Gout.   Bo Merino, MD  Note - This record has been created using Editor, commissioning.  Chart creation errors have been sought, but may not always  have been located. Such creation errors do not reflect on  the standard of medical care.

## 2016-09-26 ENCOUNTER — Ambulatory Visit (INDEPENDENT_AMBULATORY_CARE_PROVIDER_SITE_OTHER): Payer: Self-pay

## 2016-09-26 ENCOUNTER — Ambulatory Visit (INDEPENDENT_AMBULATORY_CARE_PROVIDER_SITE_OTHER): Payer: BLUE CROSS/BLUE SHIELD

## 2016-09-26 ENCOUNTER — Ambulatory Visit (INDEPENDENT_AMBULATORY_CARE_PROVIDER_SITE_OTHER): Payer: BLUE CROSS/BLUE SHIELD | Admitting: Rheumatology

## 2016-09-26 ENCOUNTER — Encounter: Payer: Self-pay | Admitting: Rheumatology

## 2016-09-26 VITALS — BP 121/72 | HR 70 | Resp 14 | Ht 70.0 in | Wt 152.0 lb

## 2016-09-26 DIAGNOSIS — M79672 Pain in left foot: Secondary | ICD-10-CM

## 2016-09-26 DIAGNOSIS — M79641 Pain in right hand: Secondary | ICD-10-CM

## 2016-09-26 DIAGNOSIS — M25562 Pain in left knee: Secondary | ICD-10-CM | POA: Diagnosis not present

## 2016-09-26 DIAGNOSIS — R5383 Other fatigue: Secondary | ICD-10-CM

## 2016-09-26 DIAGNOSIS — M25561 Pain in right knee: Secondary | ICD-10-CM

## 2016-09-26 DIAGNOSIS — M79671 Pain in right foot: Secondary | ICD-10-CM

## 2016-09-26 DIAGNOSIS — M79642 Pain in left hand: Secondary | ICD-10-CM | POA: Diagnosis not present

## 2016-09-26 DIAGNOSIS — F5101 Primary insomnia: Secondary | ICD-10-CM

## 2016-09-26 DIAGNOSIS — E782 Mixed hyperlipidemia: Secondary | ICD-10-CM

## 2016-09-26 DIAGNOSIS — I10 Essential (primary) hypertension: Secondary | ICD-10-CM

## 2016-09-26 DIAGNOSIS — M5030 Other cervical disc degeneration, unspecified cervical region: Secondary | ICD-10-CM

## 2016-09-26 DIAGNOSIS — K219 Gastro-esophageal reflux disease without esophagitis: Secondary | ICD-10-CM

## 2016-09-26 DIAGNOSIS — M47812 Spondylosis without myelopathy or radiculopathy, cervical region: Secondary | ICD-10-CM

## 2016-09-26 DIAGNOSIS — G8929 Other chronic pain: Secondary | ICD-10-CM

## 2016-09-26 DIAGNOSIS — M1A09X1 Idiopathic chronic gout, multiple sites, with tophus (tophi): Secondary | ICD-10-CM

## 2016-09-26 LAB — CBC WITH DIFFERENTIAL/PLATELET
Basophils Absolute: 58 cells/uL (ref 0–200)
Basophils Relative: 1 %
EOS PCT: 6 %
Eosinophils Absolute: 348 cells/uL (ref 15–500)
HEMATOCRIT: 46.7 % (ref 38.5–50.0)
Hemoglobin: 15.6 g/dL (ref 13.2–17.1)
LYMPHS PCT: 23 %
Lymphs Abs: 1334 cells/uL (ref 850–3900)
MCH: 32.6 pg (ref 27.0–33.0)
MCHC: 33.4 g/dL (ref 32.0–36.0)
MCV: 97.5 fL (ref 80.0–100.0)
MONOS PCT: 13 %
MPV: 11.2 fL (ref 7.5–12.5)
Monocytes Absolute: 754 cells/uL (ref 200–950)
NEUTROS PCT: 57 %
Neutro Abs: 3306 cells/uL (ref 1500–7800)
Platelets: 231 10*3/uL (ref 140–400)
RBC: 4.79 MIL/uL (ref 4.20–5.80)
RDW: 13.7 % (ref 11.0–15.0)
WBC: 5.8 10*3/uL (ref 3.8–10.8)

## 2016-09-26 LAB — CMP 10231
AG Ratio: 1.6 Ratio (ref 1.0–2.5)
ALT: 17 U/L (ref 9–46)
AST: 21 U/L (ref 10–35)
Albumin: 4.2 g/dL (ref 3.6–5.1)
Alkaline Phosphatase: 71 U/L (ref 40–115)
BILIRUBIN TOTAL: 1.3 mg/dL — AB (ref 0.2–1.2)
BUN/Creatinine Ratio: 7.7 Ratio (ref 6–22)
BUN: 8 mg/dL (ref 7–25)
CO2: 24 mmol/L (ref 20–31)
Calcium: 9.4 mg/dL (ref 8.6–10.3)
Chloride: 99 mmol/L (ref 98–110)
Creat: 1.04 mg/dL (ref 0.70–1.25)
GFR, EST NON AFRICAN AMERICAN: 76 mL/min (ref >=60)
GFR, Est African American: 88 mL/min (ref >=60)
Globulin: 2.6 g/dL (ref 1.9–3.7)
Glucose, Bld: 92 mg/dL (ref 65–99)
POTASSIUM: 4.6 mmol/L (ref 3.5–5.3)
SODIUM: 138 mmol/L (ref 135–146)
TOTAL PROTEIN: 6.8 g/dL (ref 6.1–8.1)

## 2016-09-26 LAB — IRON AND TIBC
%SAT: 41 % (ref 15–60)
Iron: 150 ug/dL (ref 50–180)
TIBC: 363 ug/dL (ref 250–425)
UIBC: 213 ug/dL (ref 125–400)

## 2016-09-26 MED ORDER — PREDNISONE 5 MG PO TABS: ORAL_TABLET | ORAL | 0 refills | Status: DC

## 2016-09-26 NOTE — Progress Notes (Signed)
Patient prescribed prednisone taper by Dr. Estanislado Pandy.  Counseled patient on the purpose, proper use, and adverse effects.  Advised patient to monitor blood pressure while on prednisone.  Patient voiced understanding and denies any further questions.    Elisabeth Most, Pharm.D., BCPS, CPP Clinical Pharmacist Pager: [phone] Phone: [phone] 09/26/2016 9:50 AM

## 2016-09-26 NOTE — Addendum Note (Signed)
Addended by: Cyndia Skeeters on: 09/26/2016 09:53 AM   Modules accepted: Orders

## 2016-09-26 NOTE — Patient Instructions (Signed)
Prednisone tablets °What is this medicine? °PREDNISONE (PRED ni sone) is a corticosteroid. It is commonly used to treat inflammation of the skin, joints, lungs, and other organs. Common conditions treated include asthma, allergies, and arthritis. It is also used for other conditions, such as blood disorders and diseases of the adrenal glands. °This medicine may be used for other purposes; ask your health care provider or pharmacist if you have questions. °COMMON BRAND NAME(S): Deltasone, Predone, Sterapred, Sterapred DS °What should I tell my health care provider before I take this medicine? °They need to know if you have any of these conditions: °-Cushing's syndrome °-diabetes °-glaucoma °-heart disease °-high blood pressure °-infection (especially a virus infection such as chickenpox, cold sores, or herpes) °-kidney disease °-liver disease °-mental illness °-myasthenia gravis °-osteoporosis °-seizures °-stomach or intestine problems °-thyroid disease °-an unusual or allergic reaction to lactose, prednisone, other medicines, foods, dyes, or preservatives °-pregnant or trying to get pregnant °-breast-feeding °How should I use this medicine? °Take this medicine by mouth with a glass of water. Follow the directions on the prescription label. Take this medicine with food. If you are taking this medicine once a day, take it in the morning. Do not take more medicine than you are told to take. Do not suddenly stop taking your medicine because you may develop a severe reaction. Your doctor will tell you how much medicine to take. If your doctor wants you to stop the medicine, the dose may be slowly lowered over time to avoid any side effects. °Talk to your pediatrician regarding the use of this medicine in children. Special care may be needed. °Overdosage: If you think you have taken too much of this medicine contact a poison control center or emergency room at once. °NOTE: This medicine is only for you. Do not share this  medicine with others. °What if I miss a dose? °If you miss a dose, take it as soon as you can. If it is almost time for your next dose, talk to your doctor or health care professional. You may need to miss a dose or take an extra dose. Do not take double or extra doses without advice. °What may interact with this medicine? °Do not take this medicine with any of the following medications: °-metyrapone °-mifepristone °This medicine may also interact with the following medications: °-aminoglutethimide °-amphotericin B °-aspirin and aspirin-like medicines °-barbiturates °-certain medicines for diabetes, like glipizide or glyburide °-cholestyramine °-cholinesterase inhibitors °-cyclosporine °-digoxin °-diuretics °-ephedrine °-male hormones, like estrogens and birth control pills °-isoniazid °-ketoconazole °-NSAIDS, medicines for pain and inflammation, like ibuprofen or naproxen °-phenytoin °-rifampin °-toxoids °-vaccines °-warfarin °This list may not describe all possible interactions. Give your health care provider a list of all the medicines, herbs, non-prescription drugs, or dietary supplements you use. Also tell them if you smoke, drink alcohol, or use illegal drugs. Some items may interact with your medicine. °What should I watch for while using this medicine? °Visit your doctor or health care professional for regular checks on your progress. If you are taking this medicine over a prolonged period, carry an identification card with your name and address, the type and dose of your medicine, and your doctor's name and address. °This medicine may increase your risk of getting an infection. Tell your doctor or health care professional if you are around anyone with measles or chickenpox, or if you develop sores or blisters that do not heal properly. °If you are going to have surgery, tell your doctor or health care professional that   you have taken this medicine within the last twelve months. °Ask your doctor or health  care professional about your diet. You may need to lower the amount of salt you eat. °This medicine may affect blood sugar levels. If you have diabetes, check with your doctor or health care professional before you change your diet or the dose of your diabetic medicine. °What side effects may I notice from receiving this medicine? °Side effects that you should report to your doctor or health care professional as soon as possible: °-allergic reactions like skin rash, itching or hives, swelling of the face, lips, or tongue °-changes in emotions or moods °-changes in vision °-depressed mood °-eye pain °-fever or chills, cough, sore throat, pain or difficulty passing urine °-increased thirst °-swelling of ankles, feet °Side effects that usually do not require medical attention (report to your doctor or health care professional if they continue or are bothersome): °-confusion, excitement, restlessness °-headache °-nausea, vomiting °-skin problems, acne, thin and shiny skin °-trouble sleeping °-weight gain °This list may not describe all possible side effects. Call your doctor for medical advice about side effects. You may report side effects to FDA at 1-800-FDA-1088. °Where should I keep my medicine? °Keep out of the reach of children. °Store at room temperature between 15 and 30 degrees C (59 and 86 degrees F). Protect from light. Keep container tightly closed. Throw away any unused medicine after the expiration date. °NOTE: This sheet is a summary. It may not cover all possible information. If you have questions about this medicine, talk to your doctor, pharmacist, or health care provider. °© 2018 [employer] (2011-01-03 10:57:14) ° °

## 2016-09-27 LAB — URINALYSIS, ROUTINE W REFLEX MICROSCOPIC
Bilirubin Urine: NEGATIVE
Glucose, UA: NEGATIVE
HGB URINE DIPSTICK: NEGATIVE
KETONES UR: NEGATIVE
Leukocytes, UA: NEGATIVE
Nitrite: NEGATIVE
PH: 6 (ref 5.0–8.0)
Protein, ur: NEGATIVE
Specific Gravity, Urine: 1.008 (ref 1.001–1.035)

## 2016-09-27 LAB — FERRITIN: FERRITIN: 226 ng/mL (ref 20–380)

## 2016-09-27 LAB — ANA: ANA: NEGATIVE

## 2016-09-27 LAB — RHEUMATOID FACTOR

## 2016-09-27 LAB — CYCLIC CITRUL PEPTIDE ANTIBODY, IGG

## 2016-09-27 LAB — SEDIMENTATION RATE: SED RATE: 1 mm/h (ref 0–20)

## 2016-09-27 LAB — URIC ACID: URIC ACID, SERUM: 3.1 mg/dL — AB (ref 4.0–8.0)

## 2016-09-27 LAB — MAGNESIUM: Magnesium: 2.3 mg/dL (ref 1.5–2.5)

## 2016-09-27 NOTE — Progress Notes (Signed)
WNL

## 2016-10-02 LAB — HLA-B27 ANTIGEN: DNA RESULT: NEGATIVE

## 2016-10-17 NOTE — Progress Notes (Signed)
Office Visit Note  Patient: Dennis Miles             Date of Birth: Feb 17, 1951           MRN: [id number]             PCP: Celene Squibb, MD Referring: Celene Squibb, MD Visit Date: 10/29/2016 Occupation: '@GUAROCC' @    Subjective:  Right great toe and left wrist pain.   History of Present Illness: Dennis Miles is a 66 y.o. male with history of gouty arthropathy and osteoarthritis. He states he had a flare in his right great toe last visit for which he took prednisone taper. He still has some residual pain in his right first toe. He  had 2 episodes of left wrist joint pain in the last 2 weeks. And it gradually his resolve by itself. He has quit drinking beer. He had few glasses of wine since the last visit. He has not been eating any red meat or shellfish. He continues to have some discomfort in his C-spine due to underlying disc disease.  Activities of Daily Living:  Patient reports morning stiffness for 2 minutes.   Patient Denies nocturnal pain.  Difficulty dressing/grooming: Denies Difficulty climbing stairs: Denies Difficulty getting out of chair: Denies Difficulty using hands for taps, buttons, cutlery, and/or writing: Denies   Review of Systems  Constitutional: Positive for fatigue. Negative for night sweats and weakness ( ).  HENT: Negative for mouth sores, mouth dryness and nose dryness.   Eyes: Negative for redness and dryness.  Respiratory: Negative for shortness of breath and difficulty breathing.   Cardiovascular: Negative for chest pain, palpitations, hypertension, irregular heartbeat and swelling in legs/feet.  Gastrointestinal: Negative for constipation and diarrhea.  Endocrine: Negative for increased urination.  Musculoskeletal: Positive for arthralgias, joint pain and morning stiffness. Negative for joint swelling, myalgias, muscle weakness, muscle tenderness and myalgias.  Skin: Negative for color change, rash, hair loss, nodules/bumps, skin tightness, ulcers  and sensitivity to sunlight.  Allergic/Immunologic: Negative for susceptible to infections.  Neurological: Negative for dizziness, fainting, memory loss and night sweats.  Hematological: Negative for swollen glands.  Psychiatric/Behavioral: Positive for sleep disturbance. Negative for depressed mood. The patient is not nervous/anxious.     PMFS History:  Patient Active Problem List   Diagnosis Date Noted  . Primary insomnia 09/26/2016  . DJD (degenerative joint disease), cervical 09/26/2016  . 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 Abdominal pain, left lower quadrant 10/19/2012  . GERD (gastroesophageal reflux disease) 04/15/2012  . Gout 04/01/2011  . Hypertension 04/01/2011  . Elevated cholesterol with high triglycerides 04/01/2011    Past Medical History:  Diagnosis Date  . Anxiety   . GERD (gastroesophageal reflux disease)   . Gout   . Headache(784.0)   . Hiatal hernia   . High cholesterol   . Hypertension     Family History  Problem Relation Age of Onset  . Colon cancer Neg Hx    Past Surgical History:  Procedure Laterality Date  . APPENDECTOMY    . COLONOSCOPY N/A 10/23/2012   Procedure: COLONOSCOPY;  Surgeon: Rogene Houston, MD;  Location: [facility];  Service: Endoscopy;  Laterality: N/A;  235  . ESOPHAGOGASTRODUODENOSCOPY  04/17/2011   Procedure: ESOPHAGOGASTRODUODENOSCOPY (EGD);  Surgeon: Rogene Houston, MD;  Location: [facility];  Service: Endoscopy;  Laterality: N/A;  8:30  . HAND SURGERY  05/28/2016  . INGUINAL HERNIA REPAIR Left 02/17/2013  Procedure: HERNIA REPAIR INGUINAL ADULT;  Surgeon: Jamesetta So, MD;  Location: [facility];  Service: General;  Laterality: Left;  . INSERTION OF MESH Left 02/17/2013   Procedure: INSERTION OF MESH;  Surgeon: Jamesetta So, MD;  Location: [facility];  Service: General;  Laterality: Left;  . sinus sugery     Social History   Social History Narrative  . No narrative on file      Objective: Vital Signs: BP 136/80   Pulse 70   Resp 16   Wt 160 lb (72.6 kg)   BMI 22.96 kg/m    Physical Exam  Constitutional: He is oriented to person, place, and time. He appears well-developed and well-nourished.  HENT:  Head: Normocephalic and atraumatic.  Eyes: Conjunctivae and EOM are normal. Pupils are equal, round, and reactive to light.  Neck: Normal range of motion. Neck supple.  Cardiovascular: Normal rate, regular rhythm and normal heart sounds.   Pulmonary/Chest: Effort normal and breath sounds normal.  Abdominal: Soft. Bowel sounds are normal.  Neurological: He is alert and oriented to person, place, and time.  Skin: Skin is warm and dry. Capillary refill takes less than 2 seconds.  Psychiatric: He has a normal mood and affect. His behavior is normal.  Nursing note and vitals reviewed.    Musculoskeletal Exam: C-spine some  limitation of motion, thoracic lumbar spine good range of motion, shoulder joints elbow joints wrist joint MCPs PIPs with good range of motion with no synovitis hip joints knee joints ankles MTPs PIPs with good range of motion with no synovitis.  CDAI Exam: No CDAI exam completed.    Investigation: Findings:  09/24/2016   CMP normal, Uric acid 3.1,ANA negative ,CBC normal, CCP normal,  Ferritin normal,HLA-B27 antigen negative ,Iron and TIBC normal, Magnesium normal, Rheumatoid factor normal, anti CCP negative, Sedimentation rate 1, and Urinalysis, Routine w reflex microscopic normal    CBC Latest Ref Rng & Units 09/26/2016 02/11/2013 10/23/2012  WBC 3.8 - 10.8 K/uL 5.8 5.3 5.9  Hemoglobin 13.2 - 17.1 g/dL 15.6 14.7 14.7  Hematocrit 38.5 - 50.0 % 46.7 41.7 42.0  Platelets 140 - 400 K/uL 231 195 203   CMP Latest Ref Rng & Units 09/26/2016 02/11/2013 12/24/2007  Glucose 65 - 99 mg/dL 92 92 99  BUN 7 - 25 mg/dL 8 9 3(L)  Creatinine 0.70 - 1.25 mg/dL 1.04 1.06 0.95  Sodium 135 - 146 mmol/L 138 137 140  Potassium 3.5 - 5.3 mmol/L 4.6 4.2 4.4  DELTA CHECK NOTED  Chloride 98 - 110 mmol/L 99 98 105  CO2 20 - 31 mmol/L '24 30 29  ' Calcium 8.6 - 10.3 mg/dL 9.4 9.7 8.3(L)  Total Protein 6.1 - 8.1 g/dL 6.8 - -  Total Bilirubin 0.2 - 1.2 mg/dL 1.3(H) - -  Alkaline Phos 40 - 115 U/L 71 - -  AST 10 - 35 U/L 21 - -  ALT 9 - 46 U/L 17 - -    Imaging: No results found.  Speciality Comments: No specialty comments available.    Procedures:  No procedures performed Allergies: Codeine   Assessment / Plan:     Visit Diagnoses: Idiopathic chronic gout of multiple sites with tophus - On Uloric 80 mg po qd and colchicine 0.6 mg by mouth daily. Patient reports having three-month no flares in the last 2 months. He of recent flare with the left wrist joint swelling and discomfort. I do not see any synovitis on examination today. He states that he's  not been drinking any beer and has taken wine occasionally. He is also following low purine diet. His uric acid isn't desirable range. Association of Uloric and gout with increased risk of heart disease was also discussed at length. Need for regular exercise, been tending blood pressure and cholesterol in desirable range was discussed.  Primary insomnia: He's been taking Ambien for that  DJD (degenerative joint disease), cervical: He is been followed by neurosurgery  History of gastroesophageal reflux (GERD)  History of hypertension well controlled  History of hyperlipidemia  Myalgia : Use of magnesium at bedtime was discussed.   Orders: No orders of the defined types were placed in this encounter.  No orders of the defined types were placed in this encounter.   Face-to-face time spent with patient was 30 minutes. 50% of time was spent in counseling and coordination of care.  Follow-Up Instructions: Return in about 4 months (around 03/01/2017) for Gout.   Bo Merino, MD  Note - This record has been created using Editor, commissioning.  Chart creation errors have been sought, but may  not always  have been located. Such creation errors do not reflect on  the standard of medical care.

## 2016-10-29 ENCOUNTER — Ambulatory Visit (INDEPENDENT_AMBULATORY_CARE_PROVIDER_SITE_OTHER): Payer: BLUE CROSS/BLUE SHIELD | Admitting: Rheumatology

## 2016-10-29 ENCOUNTER — Encounter: Payer: Self-pay | Admitting: Rheumatology

## 2016-10-29 VITALS — BP 136/80 | HR 70 | Resp 16 | Wt 160.0 lb

## 2016-10-29 DIAGNOSIS — M5030 Other cervical disc degeneration, unspecified cervical region: Secondary | ICD-10-CM | POA: Diagnosis not present

## 2016-10-29 DIAGNOSIS — Z8719 Personal history of other diseases of the digestive system: Secondary | ICD-10-CM | POA: Diagnosis not present

## 2016-10-29 DIAGNOSIS — M1A09X1 Idiopathic chronic gout, multiple sites, with tophus (tophi): Secondary | ICD-10-CM

## 2016-10-29 DIAGNOSIS — M47812 Spondylosis without myelopathy or radiculopathy, cervical region: Secondary | ICD-10-CM

## 2016-10-29 DIAGNOSIS — Z8639 Personal history of other endocrine, nutritional and metabolic disease: Secondary | ICD-10-CM

## 2016-10-29 DIAGNOSIS — M791 Myalgia, unspecified site: Secondary | ICD-10-CM

## 2016-10-29 DIAGNOSIS — F5101 Primary insomnia: Secondary | ICD-10-CM | POA: Diagnosis not present

## 2016-10-29 DIAGNOSIS — Z8679 Personal history of other diseases of the circulatory system: Secondary | ICD-10-CM

## 2016-10-29 NOTE — Patient Instructions (Signed)
Magnesium malate 250 mg at bed time

## 2017-02-18 NOTE — Progress Notes (Signed)
Office Visit Note  Patient: Dennis Miles             Date of Birth: 1951/03/22           MRN: [id number]             PCP: Celene Squibb, MD Referring: Celene Squibb, MD Visit Date: 02/28/2017 Occupation: @GUAROCC @    Subjective:  Recent gout flare.   History of Present Illness: Dennis Miles is a 66 y.o. male with history of gouty arthropathy. He states in the first week of September he had a gout flare in his left fifth toe. He states he was taking colchicine at bedtime as PCP where he was given a course of steroid which treated the flare. He recalls having pizza with some sausages and night before the flare. He has not been drinking any alcohol. This is the only flare he has had since his last visit. His C-spine is still continues to bother him.  Activities of Daily Living:  Patient denies morning stiffness.   Patient Denies nocturnal pain.  Difficulty dressing/grooming: Denies Difficulty climbing stairs: Denies Difficulty getting out of chair: Denies Difficulty using hands for taps, buttons, cutlery, and/or writing: Denies   Review of Systems  Constitutional: Positive for fatigue. Negative for night sweats and weakness ( ).  HENT: Negative.  Negative for mouth sores, mouth dryness and nose dryness.   Eyes: Negative.  Negative for redness and dryness.  Respiratory: Negative.  Negative for shortness of breath and difficulty breathing.   Cardiovascular: Negative.  Negative for chest pain, palpitations, hypertension, irregular heartbeat and swelling in legs/feet.  Gastrointestinal: Negative.  Negative for constipation and diarrhea.  Endocrine: Negative.  Negative for increased urination.  Genitourinary: Negative.   Musculoskeletal: Positive for arthralgias, joint pain, myalgias and myalgias. Negative for joint swelling, muscle weakness, morning stiffness and muscle tenderness.       Legs feel weak / aching   Skin: Negative.  Negative for color change, rash, hair loss,  nodules/bumps, skin tightness, ulcers and sensitivity to sunlight.       Dry skin otherwise normal   Allergic/Immunologic: Negative.  Negative for susceptible to infections.  Neurological: Negative.  Negative for dizziness, fainting, memory loss and night sweats.  Hematological: Negative.  Negative for swollen glands.  Psychiatric/Behavioral: Negative.  Negative for depressed mood and sleep disturbance. The patient is not nervous/anxious.     PMFS History:  Patient Active Problem List   Diagnosis Date Noted  . Primary insomnia 09/26/2016  . DJD (degenerative joint disease), cervical 09/26/2016  . 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 Abdominal pain, left lower quadrant 10/19/2012  . GERD (gastroesophageal reflux disease) 04/15/2012  . Gout 04/01/2011  . Hypertension 04/01/2011  . Elevated cholesterol with high triglycerides 04/01/2011    Past Medical History:  Diagnosis Date  . Anxiety   . GERD (gastroesophageal reflux disease)   . Gout   . Headache(784.0)   . Hiatal hernia   . High cholesterol   . Hypertension     Family History  Problem Relation Age of Onset  . Colon cancer Neg Hx    Past Surgical History:  Procedure Laterality Date  . APPENDECTOMY    . COLONOSCOPY N/A 10/23/2012   Procedure: COLONOSCOPY;  Surgeon: Rogene Houston, MD;  Location: [facility];  Service: Endoscopy;  Laterality: N/A;  235  . ESOPHAGOGASTRODUODENOSCOPY  04/17/2011   Procedure: ESOPHAGOGASTRODUODENOSCOPY (EGD);  Surgeon: Rogene Houston, MD;  Location: [facility];  Service: Endoscopy;  Laterality: N/A;  8:30  . HAND SURGERY  05/28/2016  . INGUINAL HERNIA REPAIR Left 02/17/2013   Procedure: HERNIA REPAIR INGUINAL ADULT;  Surgeon: Jamesetta So, MD;  Location: [facility];  Service: General;  Laterality: Left;  . INSERTION OF MESH Left 02/17/2013   Procedure: INSERTION OF MESH;  Surgeon: Jamesetta So, MD;  Location: [facility];  Service: General;  Laterality: Left;    . sinus sugery     Social History   Social History Narrative  . No narrative on file     Objective: Vital Signs: BP 126/74   Pulse 78   Resp 16   Ht 5\' 10"  (1.778 m)   Wt 158 lb (71.7 kg)   BMI 22.67 kg/m    Physical Exam  Constitutional: He is oriented to person, place, and time. He appears well-developed and well-nourished.  HENT:  Head: Normocephalic and atraumatic.  Eyes: Pupils are equal, round, and reactive to light. Conjunctivae and EOM are normal.  Neck: Normal range of motion. Neck supple.  Cardiovascular: Normal rate, regular rhythm and normal heart sounds.   Pulmonary/Chest: Effort normal and breath sounds normal.  Abdominal: Soft. Bowel sounds are normal.  Neurological: He is alert and oriented to person, place, and time.  Skin: Skin is warm and dry. Capillary refill takes less than 2 seconds.  Psychiatric: He has a normal mood and affect. His behavior is normal.  Nursing note and vitals reviewed.    Musculoskeletal Exam: C-spine limited range of motion. Thoracic and lumbar spine good range of motion. Shoulder joints, elbow joints, wrist joints are good range of motion. Hip joints knee joints ankles MTPs PIPs with good range of motion. No swelling synovitis or tenderness was noted.  CDAI Exam: No CDAI exam completed.    Investigation: No additional findings. CBC Latest Ref Rng & Units 09/26/2016 02/11/2013 10/23/2012  WBC 3.8 - 10.8 K/uL 5.8 5.3 5.9  Hemoglobin 13.2 - 17.1 g/dL 15.6 14.7 14.7  Hematocrit 38.5 - 50.0 % 46.7 41.7 42.0  Platelets 140 - 400 K/uL 231 195 203   CMP     Component Value Date/Time   NA 138 09/26/2016 0910   K 4.6 09/26/2016 0910   CL 99 09/26/2016 0910   CO2 24 09/26/2016 0910   GLUCOSE 92 09/26/2016 0910   BUN 8 09/26/2016 0910   CREATININE 1.04 09/26/2016 0910   CALCIUM 9.4 09/26/2016 0910   PROT 6.8 09/26/2016 0910   ALBUMIN 4.2 09/26/2016 0910   AST 21 09/26/2016 0910   ALT 17 09/26/2016 0910   ALKPHOS 71 09/26/2016  0910   BILITOT 1.3 (H) 09/26/2016 0910   GFRNONAA 76 09/26/2016 0910   GFRAA 88 09/26/2016 0910  uric acid 3.1  Imaging: No results found.  Speciality Comments: No specialty comments available.    Procedures:  No procedures performed Allergies: Codeine   Assessment / Plan:     Visit Diagnoses: Idiopathic chronic gout of multiple sites with tophus - Uloric 80 mg po qd, Colcrys 0.6 mg prn. Patient had a flare about a month ago. He relates it to eating some red meat. We will observe for right now.  Medication management - Plan: CBC with Differential/Platelet, COMPLETE METABOLIC PANEL WITH GFR, Uric acid  Osteoarthritis feet primary bilateral: He has some hammertoes which causes discomfort. I will send him to orthotics for metatarsal pads.  DJD (degenerative joint disease), cervical: Chronic pain  Primary insomnia chronically sleep hygiene discussed.  Myalgia: He continues to have some myalgias in his lower extremity.   History of hypertension: His blood pressure is well controlled.  History of hyperlipidemia  History of gastroesophageal reflux (GERD)    Orders: Orders Placed This Encounter  Procedures  . DME Other see comment  . CBC with Differential/Platelet  . COMPLETE METABOLIC PANEL WITH GFR  . Uric acid   No orders of the defined types were placed in this encounter.     Follow-Up Instructions: Return in about 6 months (around 08/28/2017) for Gout.   Bo Merino, MD  Note - This record has been created using Editor, commissioning.  Chart creation errors have been sought, but may not always  have been located. Such creation errors do not reflect on  the standard of medical care.

## 2017-02-28 ENCOUNTER — Encounter: Payer: Self-pay | Admitting: Rheumatology

## 2017-02-28 ENCOUNTER — Ambulatory Visit (INDEPENDENT_AMBULATORY_CARE_PROVIDER_SITE_OTHER): Payer: BLUE CROSS/BLUE SHIELD | Admitting: Rheumatology

## 2017-02-28 VITALS — BP 126/74 | HR 78 | Resp 16 | Ht 70.0 in | Wt 158.0 lb

## 2017-02-28 DIAGNOSIS — Z8679 Personal history of other diseases of the circulatory system: Secondary | ICD-10-CM

## 2017-02-28 DIAGNOSIS — M5030 Other cervical disc degeneration, unspecified cervical region: Secondary | ICD-10-CM | POA: Diagnosis not present

## 2017-02-28 DIAGNOSIS — M791 Myalgia, unspecified site: Secondary | ICD-10-CM

## 2017-02-28 DIAGNOSIS — M47812 Spondylosis without myelopathy or radiculopathy, cervical region: Secondary | ICD-10-CM

## 2017-02-28 DIAGNOSIS — Z8719 Personal history of other diseases of the digestive system: Secondary | ICD-10-CM | POA: Diagnosis not present

## 2017-02-28 DIAGNOSIS — M19072 Primary osteoarthritis, left ankle and foot: Secondary | ICD-10-CM | POA: Diagnosis not present

## 2017-02-28 DIAGNOSIS — M1A09X1 Idiopathic chronic gout, multiple sites, with tophus (tophi): Secondary | ICD-10-CM | POA: Diagnosis not present

## 2017-02-28 DIAGNOSIS — Z8639 Personal history of other endocrine, nutritional and metabolic disease: Secondary | ICD-10-CM

## 2017-02-28 DIAGNOSIS — Z79899 Other long term (current) drug therapy: Secondary | ICD-10-CM | POA: Diagnosis not present

## 2017-02-28 DIAGNOSIS — F5101 Primary insomnia: Secondary | ICD-10-CM | POA: Diagnosis not present

## 2017-02-28 DIAGNOSIS — M19071 Primary osteoarthritis, right ankle and foot: Secondary | ICD-10-CM

## 2017-03-01 LAB — CBC WITH DIFFERENTIAL/PLATELET
Basophils Absolute: 27 cells/uL (ref 0–200)
Basophils Relative: 0.4 %
Eosinophils Absolute: 241 cells/uL (ref 15–500)
Eosinophils Relative: 3.6 %
HEMATOCRIT: 43.9 % (ref 38.5–50.0)
Hemoglobin: 14.9 g/dL (ref 13.2–17.1)
Lymphs Abs: 1789 cells/uL (ref 850–3900)
MCH: 32 pg (ref 27.0–33.0)
MCHC: 33.9 g/dL (ref 32.0–36.0)
MCV: 94.2 fL (ref 80.0–100.0)
MONOS PCT: 12.3 %
MPV: 11.5 fL (ref 7.5–12.5)
NEUTROS PCT: 57 %
Neutro Abs: 3819 cells/uL (ref 1500–7800)
Platelets: 225 10*3/uL (ref 140–400)
RBC: 4.66 10*6/uL (ref 4.20–5.80)
RDW: 12.1 % (ref 11.0–15.0)
TOTAL LYMPHOCYTE: 26.7 %
WBC: 6.7 10*3/uL (ref 3.8–10.8)
WBCMIX: 824 {cells}/uL (ref 200–950)

## 2017-03-01 LAB — COMPLETE METABOLIC PANEL WITH GFR
AG RATIO: 1.8 (calc) (ref 1.0–2.5)
ALBUMIN MSPROF: 4.3 g/dL (ref 3.6–5.1)
ALKALINE PHOSPHATASE (APISO): 67 U/L (ref 40–115)
ALT: 29 U/L (ref 9–46)
AST: 26 U/L (ref 10–35)
BILIRUBIN TOTAL: 1 mg/dL (ref 0.2–1.2)
BUN: 8 mg/dL (ref 7–25)
CHLORIDE: 102 mmol/L (ref 98–110)
CO2: 29 mmol/L (ref 20–32)
Calcium: 9.3 mg/dL (ref 8.6–10.3)
Creat: 1.02 mg/dL (ref 0.70–1.25)
GFR, Est African American: 90 mL/min/{1.73_m2} (ref >=60)
GFR, Est Non African American: 77 mL/min/{1.73_m2} (ref >=60)
GLOBULIN: 2.4 g/dL (ref 1.9–3.7)
Glucose, Bld: 86 mg/dL (ref 65–99)
POTASSIUM: 4.7 mmol/L (ref 3.5–5.3)
SODIUM: 139 mmol/L (ref 135–146)
Total Protein: 6.7 g/dL (ref 6.1–8.1)

## 2017-03-01 LAB — URIC ACID: URIC ACID, SERUM: 2.9 mg/dL — AB (ref 4.0–8.0)

## 2017-03-03 NOTE — Progress Notes (Signed)
CBC, CMP normal, uric acid is low and indesirable range

## 2017-06-02 ENCOUNTER — Encounter (INDEPENDENT_AMBULATORY_CARE_PROVIDER_SITE_OTHER): Payer: Self-pay | Admitting: Internal Medicine

## 2017-06-24 ENCOUNTER — Encounter (INDEPENDENT_AMBULATORY_CARE_PROVIDER_SITE_OTHER): Payer: Self-pay | Admitting: Internal Medicine

## 2017-06-24 ENCOUNTER — Ambulatory Visit (INDEPENDENT_AMBULATORY_CARE_PROVIDER_SITE_OTHER): Payer: BLUE CROSS/BLUE SHIELD | Admitting: Internal Medicine

## 2017-06-24 VITALS — BP 130/90 | HR 72 | Temp 98.1°F | Resp 18 | Ht 70.0 in | Wt 159.4 lb

## 2017-06-24 DIAGNOSIS — K219 Gastro-esophageal reflux disease without esophagitis: Secondary | ICD-10-CM

## 2017-06-24 NOTE — Patient Instructions (Signed)
Try pantoprazole every other day. Take famotidine OTC 20 mg on days when you do not take pantoprazole. If you experience breakthrough symptoms with this change he can go back to taking pantoprazole every day. Please call office with progress report in 6-8 weeks.

## 2017-06-24 NOTE — Progress Notes (Signed)
Presenting complaint;  Follow-up for GERD.  Subjective:  Dennis Miles 67 year old Caucasian male who has chronic GERD and is here for scheduled visit.  He was last seen 1 year ago. Back in November 2012 revealing distal esophageal stricture which was dilated by passing 54 [place] dilator.  Esophageal mucosa suggested eosinophilic changes but biopsy was negative.  He states he is doing well.  He states he has had one episode of dysphagia since last seen.  He has occasional heartburn with certain foods.  His appetite is fair but he is not losing weight.  He has gained 4 pounds since his last visit.  He is not having any side effects with PPI and does not have any concerns.  His bowels move daily.  He may have occasional diarrhea but he denies melena or rectal bleeding. He complains of pain in his neck and left arm.  He states he has had this pain for 3 years and fluctuates.  He has been evaluated by rheumatologist and a neurosurgeon and no abnormality found that would be corrected with surgery.  He was tried on gabapentin but it did not help.  He feels his neck and left arm pain is affecting quality of life.  He is not able to do a lot of physical tasks and unable to play golf.   He had a EGD  Current Medications: Outpatient Encounter Medications as of 06/24/2017  Medication Sig  . cetirizine (ZYRTEC) 10 MG tablet Take 10 mg by mouth daily.  . colchicine 0.6 MG tablet Take 0.6 mg by mouth as needed.   . Febuxostat (ULORIC) 80 MG TABS Take 80 mg by mouth.  . fluticasone (FLONASE) 50 MCG/ACT nasal spray Place 1 spray into the nose daily as needed. For congestion  . lisinopril (PRINIVIL,ZESTRIL) 10 MG tablet Take 10 mg by mouth daily.  . pantoprazole (PROTONIX) 40 MG tablet TAKE ONE TABLET BY MOUTH ONCE DAILY  . zolpidem (AMBIEN) 10 MG tablet Take 10 mg by mouth at bedtime. For sleep   No facility-administered encounter medications on file as of 06/24/2017.      Objective: Blood pressure 130/90,  pulse 72, temperature 98.1 F (36.7 C), temperature source Oral, resp. rate 18, height 5\' 10"  (1.778 m), weight 159 lb 6.4 oz (72.3 kg). Patient is alert and in no acute distress. Conjunctiva is pink. Sclera is nonicteric Oropharyngeal mucosa is normal. No neck masses or thyromegaly noted. Cardiac exam with regular rhythm normal S1 and S2. No murmur or gallop noted. Lungs are clear to auscultation. Abdomen is symmetrical soft and nontender without organomegaly or masses. No LE edema or clubbing noted.    Assessment:  #1.  Chronic GERD.  He is doing well with therapy.  He required esophageal dilation over 6 years ago.  Esophageal biopsy was negative for eosinophilic esophagitis.  Since he is doing well we will try changing the PPI to every other day and alternate with H2B.  #2.  Patient is up-to-date on screening for CRC.  Last colonoscopy was in May 2014 for the removal of very small tubular adenoma and he wanted to wait 10 years.  Therefore next colonoscopy would be due in May 2024.   Plan:  Patient will try pantoprazole 40 mg every other day. He will take famotidine OTC 20 mg on days when he does not take pantoprazole. If this combination does not work he will go back to pantoprazole daily. Patient will call office with progress report in 6-8 weeks.   Patient advised  to talk with Dr. Nevada Crane about his ongoing neck and left arm pain and may be consider referral to neurologist. Office visit in 1 year.

## 2017-08-15 NOTE — Progress Notes (Signed)
Office Visit Note  Patient: Dennis Miles             Date of Birth: 1951/04/21           MRN: [id number]             PCP: Celene Squibb, MD Referring: Celene Squibb, MD Visit Date: 08/29/2017 Occupation: @GUAROCC @    Subjective:  Neck pain    History of Present Illness: Dennis Miles is a 67 y.o. male history of gout, osteoarthritis, and DDD.  Patient states that he continues to take Uloric 80 mg daily.  He is also taking colchicine 0.6 mg daily.  He denies any recent gout flares.  He states his last visit flare was in September 2018.  He would like to discuss tapering to a lower dose of Uloric.  He denies any new tophi. Patient states he continues to have chronic pain in his neck.  He states he has radiation of symptoms down his left arm.  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  He states the pain has been worse this week due to chopping wood and being more physical.  He states that he has seen a surgeon in the past who was not a surgical candidate.  He states he has pain with range of motion and some stiffness.  Patient states his feet are not causing discomfort at this time.  He states he wears his orthotics occasionally but sometimes he feels his orthotics make the pain worse.  He denies any pain or swelling in his hands.       Activities of Daily Living:  Patient reports morning stiffness for 0 minutes.   Patient Reports nocturnal pain.  Difficulty dressing/grooming: Denies Difficulty climbing stairs: Denies Difficulty getting out of chair: Denies Difficulty using hands for taps, buttons, cutlery, and/or writing: Denies   Review of Systems  Constitutional: Positive for fatigue. Negative for night sweats.  HENT: Negative for mouth sores, mouth dryness and nose dryness.   Eyes: Negative for redness, itching and dryness.  Respiratory: Negative for shortness of breath and difficulty breathing.     Cardiovascular: Negative for chest pain, palpitations, hypertension, irregular heartbeat and swelling in legs/feet.  Gastrointestinal: Negative for abdominal pain, constipation and diarrhea.  Endocrine: Negative for increased urination.  Genitourinary: Negative for painful urination and pelvic pain.  Musculoskeletal: Positive for arthralgias and joint pain. Negative for joint swelling, myalgias, muscle weakness, morning stiffness, muscle tenderness and myalgias.  Skin: Negative for color change, rash, hair loss, nodules/bumps, skin tightness, ulcers and sensitivity to sunlight.  Allergic/Immunologic: Negative for susceptible to infections.  Neurological: Positive for weakness. Negative for dizziness, fainting, memory loss and night sweats.  Hematological: Negative for bruising/bleeding tendency and swollen glands.  Psychiatric/Behavioral: Negative for depressed mood, confusion and sleep disturbance. The patient is not nervous/anxious.     PMFS History:  Patient Active Problem List   Diagnosis Date Noted  . Primary insomnia 09/26/2016  . DJD (degenerative joint disease), cervical 09/26/2016  . 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 Abdominal pain, left lower quadrant 10/19/2012  . GERD (gastroesophageal reflux disease) 04/15/2012  . Gout 04/01/2011  . Hypertension 04/01/2011  . Elevated cholesterol with high triglycerides 04/01/2011    Past Medical History:  Diagnosis Date  . Anxiety   . GERD (gastroesophageal reflux disease)   . Gout   . Headache(784.0)   .  Hiatal hernia   . High cholesterol   . Hypertension     Family History  Problem Relation Age of Onset  . Colon cancer Neg Hx    Past Surgical History:  Procedure Laterality Date  . APPENDECTOMY    . COLONOSCOPY N/A 10/23/2012   Procedure: COLONOSCOPY;  Surgeon: Rogene Houston, MD;  Location: [facility];  Service: Endoscopy;  Laterality: N/A;  235  . ESOPHAGOGASTRODUODENOSCOPY  04/17/2011    Procedure: ESOPHAGOGASTRODUODENOSCOPY (EGD);  Surgeon: Rogene Houston, MD;  Location: [facility];  Service: Endoscopy;  Laterality: N/A;  8:30  . HAND SURGERY  05/28/2016  . INGUINAL HERNIA REPAIR Left 02/17/2013   Procedure: HERNIA REPAIR INGUINAL ADULT;  Surgeon: Jamesetta So, MD;  Location: [facility];  Service: General;  Laterality: Left;  . INSERTION OF MESH Left 02/17/2013   Procedure: INSERTION OF MESH;  Surgeon: Jamesetta So, MD;  Location: [facility];  Service: General;  Laterality: Left;  . sinus sugery     Social History   Social History Narrative  . Not on file     Objective: Vital Signs: BP 121/79 (BP Location: Left Arm, Patient Position: Sitting, Cuff Size: Normal)   Pulse 69   Resp 15   Ht 5\' 9"  (1.753 m)   Wt 159 lb 8 oz (72.3 kg)   BMI 23.55 kg/m    Physical Exam  Constitutional: He is oriented to person, place, and time. He appears well-developed and well-nourished.  HENT:  Head: Normocephalic and atraumatic.  Eyes: Pupils are equal, round, and reactive to light. Conjunctivae and EOM are normal.  Neck: Normal range of motion. Neck supple.  Cardiovascular: Normal rate, regular rhythm and normal heart sounds.  Pulmonary/Chest: Effort normal and breath sounds normal.  Abdominal: Soft. Bowel sounds are normal.  Neurological: He is alert and oriented to person, place, and time.  Skin: Skin is warm and dry. Capillary refill takes less than 2 seconds.  Psychiatric: He has a normal mood and affect. His behavior is normal.  Nursing note and vitals reviewed.    Musculoskeletal Exam: C-spine limited range of motion with discomfort.  Thoracic and lumbar spine good range of motion.  No midline spinal tenderness.  No SI joint tenderness.  Shoulder joints, elbow joints, wrist joints, MCPs, PIPs, DIPs good range of motion with no synovitis.  He has PIP and DIP synovial thickening consistent with osteoarthritis.  Hip joints, knee joints, ankle joints, MTPs, PIPs, DIPs good  range of motion with no synovitis.  No warmth or effusion of bilateral knees.  No tenderness of trochanteric bursa.  CDAI Exam: No CDAI exam completed.    Investigation: No additional findings.Uric acid: 02/28/2017 2.9 CBC Latest Ref Rng & Units 02/28/2017 09/26/2016 02/11/2013  WBC 3.8 - 10.8 Thousand/uL 6.7 5.8 5.3  Hemoglobin 13.2 - 17.1 g/dL 14.9 15.6 14.7  Hematocrit 38.5 - 50.0 % 43.9 46.7 41.7  Platelets 140 - 400 Thousand/uL 225 231 195   CMP Latest Ref Rng & Units 02/28/2017 09/26/2016 02/11/2013  Glucose 65 - 99 mg/dL 86 92 92  BUN 7 - 25 mg/dL 8 8 9   Creatinine 0.70 - 1.25 mg/dL 1.02 1.04 1.06  Sodium 135 - 146 mmol/L 139 138 137  Potassium 3.5 - 5.3 mmol/L 4.7 4.6 4.2  Chloride 98 - 110 mmol/L 102 99 98  CO2 20 - 32 mmol/L 29 24 30   Calcium 8.6 - 10.3 mg/dL 9.3 9.4 9.7  Total Protein 6.1 - 8.1 g/dL 6.7 6.8 -  Total Bilirubin 0.2 - 1.2 mg/dL 1.0 1.3(H) -  Alkaline Phos 40 - 115 U/L - 71 -  AST 10 - 35 U/L 26 21 -  ALT 9 - 46 U/L 29 17 -    Imaging: No results found.  Speciality Comments: No specialty comments available.    Procedures:  No procedures performed Allergies: Codeine   Assessment / Plan:     Visit Diagnoses: Idiopathic chronic gout of multiple sites with tophus -he has not had any recent gout flares.  His last flare was in September 2018.  His uric acid at that time was 2.9.  He continues to take Uloric 80 mg p.o. every day.  He is also been taking colchicine 0.6 mg daily.  He would like to discuss tapering down on his Uloric dose.  We will check a uric acid level today.  If his uric acid level maintains within the desirable range he can start alternating 80 mg and 40 mg every other day.  If he continues to not flare he can go down to Uloric 40 mg daily.  He was advised to continue on colchicine 0.6 mg daily.- Plan: Uric acid  Medication monitoring encounter -CBC and CMP are ordered today to monitor for drug toxicity.  Plan: CBC with Differential/Platelet,  COMPLETE METABOLIC PANEL WITH GFR   Primary osteoarthritis of both feet: No discomfort at this time.  He wears orthotics in his shoes regularly.  He feels sometimes the orthotics actually worsen his feet pain.  DDD (degenerative disc disease), cervical: Chronic pain.  He has stiffness and discomfort with range of motion.  He has attended physical therapy as well as seen a chiropractor in the past.  He is also seen a neurosurgeon who told him he was not a surgical candidate at that time.  We provided a handout of neck exercises that he can perform at home.  Other medical conditions are listed as follows:  Primary insomnia  Myalgia  History of gastroesophageal reflux (GERD)  History of hypertension  History of hyperlipidemia    Orders: Orders Placed This Encounter  Procedures  . Uric acid  . CBC with Differential/Platelet  . COMPLETE METABOLIC PANEL WITH GFR   No orders of the defined types were placed in this encounter.   Face-to-face time spent with patient was 30 minutes. >50% of time was spent in counseling and coordination of care.  Follow-Up Instructions: Return in about 6 months (around 03/01/2018) for Gout, Osteoarthritis.   Ofilia Neas, PA-C   I examined and evaluated the patient with Hazel Sams PA. The plan of care was discussed as noted above.  Bo Merino, MD  Note - This record has been created using Editor, commissioning.  Chart creation errors have been sought, but may not always  have been located. Such creation errors do not reflect on  the standard of medical care.

## 2017-08-29 ENCOUNTER — Encounter: Payer: Self-pay | Admitting: Rheumatology

## 2017-08-29 ENCOUNTER — Ambulatory Visit: Payer: BLUE CROSS/BLUE SHIELD | Admitting: Rheumatology

## 2017-08-29 VITALS — BP 121/79 | HR 69 | Resp 15 | Ht 69.0 in | Wt 159.5 lb

## 2017-08-29 DIAGNOSIS — Z5181 Encounter for therapeutic drug level monitoring: Secondary | ICD-10-CM

## 2017-08-29 DIAGNOSIS — Z8719 Personal history of other diseases of the digestive system: Secondary | ICD-10-CM | POA: Diagnosis not present

## 2017-08-29 DIAGNOSIS — M19071 Primary osteoarthritis, right ankle and foot: Secondary | ICD-10-CM | POA: Diagnosis not present

## 2017-08-29 DIAGNOSIS — M19072 Primary osteoarthritis, left ankle and foot: Secondary | ICD-10-CM | POA: Diagnosis not present

## 2017-08-29 DIAGNOSIS — F5101 Primary insomnia: Secondary | ICD-10-CM | POA: Diagnosis not present

## 2017-08-29 DIAGNOSIS — M5030 Other cervical disc degeneration, unspecified cervical region: Secondary | ICD-10-CM | POA: Diagnosis not present

## 2017-08-29 DIAGNOSIS — M791 Myalgia, unspecified site: Secondary | ICD-10-CM

## 2017-08-29 DIAGNOSIS — Z8679 Personal history of other diseases of the circulatory system: Secondary | ICD-10-CM

## 2017-08-29 DIAGNOSIS — M1A09X1 Idiopathic chronic gout, multiple sites, with tophus (tophi): Secondary | ICD-10-CM

## 2017-08-29 DIAGNOSIS — Z8639 Personal history of other endocrine, nutritional and metabolic disease: Secondary | ICD-10-CM

## 2017-08-29 NOTE — Patient Instructions (Signed)
Neck Exercises Neck exercises can be important for many reasons:  They can help you to improve and maintain flexibility in your neck. This can be especially important as you age.  They can help to make your neck stronger. This can make movement easier.  They can reduce or prevent neck pain.  They may help your upper back.  Ask your health care provider which neck exercises would be best for you. Exercises Neck Press Repeat this exercise 10 times. Do it first thing in the morning and right before bed or as told by your health care provider. 1. Lie on your back on a firm bed or on the floor with a pillow under your head. 2. Use your neck muscles to push your head down on the pillow and straighten your spine. 3. Hold the position as well as you can. Keep your head facing up and your chin tucked. 4. Slowly count to 5 while holding this position. 5. Relax for a few seconds. Then repeat.  Isometric Strengthening Do a full set of these exercises 2 times a day or as told by your health care provider. 1. Sit in a supportive chair and place your hand on your forehead. 2. Push forward with your head and neck while pushing back with your hand. Hold for 10 seconds. 3. Relax. Then repeat the exercise 3 times. 4. Next, do thesequence again, this time putting your hand against the back of your head. Use your head and neck to push backward against the hand pressure. 5. Finally, do the same exercise on either side of your head, pushing sideways against the pressure of your hand.  Prone Head Lifts Repeat this exercise 5 times. Do this 2 times a day or as told by your health care provider. 1. Lie face-down, resting on your elbows so that your chest and upper back are raised. 2. Start with your head facing downward, near your chest. Position your chin either on or near your chest. 3. Slowly lift your head upward. Lift until you are looking straight ahead. Then continue lifting your head as far back as  you can stretch. 4. Hold your head up for 5 seconds. Then slowly lower it to your starting position.  Supine Head Lifts Repeat this exercise 8-10 times. Do this 2 times a day or as told by your health care provider. 1. Lie on your back, bending your knees to point to the ceiling and keeping your feet flat on the floor. 2. Lift your head slowly off the floor, raising your chin toward your chest. 3. Hold for 5 seconds. 4. Relax and repeat.  Scapular Retraction Repeat this exercise 5 times. Do this 2 times a day or as told by your health care provider. 1. Stand with your arms at your sides. Look straight ahead. 2. Slowly pull both shoulders backward and downward until you feel a stretch between your shoulder blades in your upper back. 3. Hold for 10-30 seconds. 4. Relax and repeat.  Contact a health care provider if:  Your neck pain or discomfort gets much worse when you do an exercise.  Your neck pain or discomfort does not improve within 2 hours after you exercise. If you have any of these problems, stop exercising right away. Do not do the exercises again unless your health care provider says that you can. Get help right away if:  You develop sudden, severe neck pain. If this happens, stop exercising right away. Do not do the exercises again unless your   health care provider says that you can. Exercises Neck Stretch  Repeat this exercise 3-5 times. 1. Do this exercise while standing or while sitting in a chair. 2. Place your feet flat on the floor, shoulder-width apart. 3. Slowly turn your head to the right. Turn it all the way to the right so you can look over your right shoulder. Do not tilt or tip your head. 4. Hold this position for 10-30 seconds. 5. Slowly turn your head to the left, to look over your left shoulder. 6. Hold this position for 10-30 seconds.  Neck Retraction Repeat this exercise 8-10 times. Do this 3-4 times a day or as told by your health care  provider. 1. Do this exercise while standing or while sitting in a sturdy chair. 2. Look straight ahead. Do not bend your neck. 3. Use your fingers to push your chin backward. Do not bend your neck for this movement. Continue to face straight ahead. If you are doing the exercise properly, you will feel a slight sensation in your throat and a stretch at the back of your neck. 4. Hold the stretch for 1-2 seconds. Relax and repeat.  This information is not intended to replace advice given to you by your health care provider. Make sure you discuss any questions you have with your health care provider. Document Released: 05/01/2015 Document Revised: 10/26/2015 Document Reviewed: 11/28/2014 Elsevier Interactive Patient Education  2018 [employer]  

## 2017-08-30 LAB — COMPLETE METABOLIC PANEL WITH GFR
AG Ratio: 1.6 (calc) (ref 1.0–2.5)
ALT: 18 U/L (ref 9–46)
AST: 23 U/L (ref 10–35)
Albumin: 4.1 g/dL (ref 3.6–5.1)
Alkaline phosphatase (APISO): 64 U/L (ref 40–115)
BILIRUBIN TOTAL: 0.8 mg/dL (ref 0.2–1.2)
BUN: 8 mg/dL (ref 7–25)
CHLORIDE: 104 mmol/L (ref 98–110)
CO2: 30 mmol/L (ref 20–32)
Calcium: 9.3 mg/dL (ref 8.6–10.3)
Creat: 0.94 mg/dL (ref 0.70–1.25)
GFR, EST AFRICAN AMERICAN: 99 mL/min/{1.73_m2} (ref >=60)
GFR, Est Non African American: 85 mL/min/{1.73_m2} (ref >=60)
GLUCOSE: 87 mg/dL (ref 65–99)
Globulin: 2.6 g/dL (calc) (ref 1.9–3.7)
POTASSIUM: 4.9 mmol/L (ref 3.5–5.3)
Sodium: 140 mmol/L (ref 135–146)
TOTAL PROTEIN: 6.7 g/dL (ref 6.1–8.1)

## 2017-08-30 LAB — CBC WITH DIFFERENTIAL/PLATELET
BASOS PCT: 0.4 %
Basophils Absolute: 27 cells/uL (ref 0–200)
EOS ABS: 382 {cells}/uL (ref 15–500)
Eosinophils Relative: 5.7 %
HCT: 43.7 % (ref 38.5–50.0)
Hemoglobin: 15.5 g/dL (ref 13.2–17.1)
Lymphs Abs: 1688 cells/uL (ref 850–3900)
MCH: 34.1 pg — AB (ref 27.0–33.0)
MCHC: 35.5 g/dL (ref 32.0–36.0)
MCV: 96 fL (ref 80.0–100.0)
MONOS PCT: 11.3 %
MPV: 12.3 fL (ref 7.5–12.5)
NEUTROS PCT: 57.4 %
Neutro Abs: 3846 cells/uL (ref 1500–7800)
PLATELETS: 208 10*3/uL (ref 140–400)
RBC: 4.55 10*6/uL (ref 4.20–5.80)
RDW: 12 % (ref 11.0–15.0)
TOTAL LYMPHOCYTE: 25.2 %
WBC mixed population: 757 cells/uL (ref 200–950)
WBC: 6.7 10*3/uL (ref 3.8–10.8)

## 2017-08-30 LAB — URIC ACID: Uric Acid, Serum: 4.4 mg/dL (ref 4.0–8.0)

## 2017-09-01 NOTE — Progress Notes (Signed)
Uric acid within desirable limits.  CMP WNL. CBC stable.

## 2017-09-29 ENCOUNTER — Other Ambulatory Visit (INDEPENDENT_AMBULATORY_CARE_PROVIDER_SITE_OTHER): Payer: Self-pay | Admitting: Internal Medicine

## 2017-11-25 IMAGING — MR MR CERVICAL SPINE W/O CM
4 of 6 series · 18 of 48 positions shown · non-contrast
Comparison: None.

CLINICAL DATA: Neck pain and left arm numbness.

EXAM:
MRI CERVICAL SPINE WITHOUT CONTRAST
TECHNIQUE: Multiplanar, multisequence MR imaging of the cervical spine was
performed. No intravenous contrast was administered.

[Series 3: T2 · sagittal · 3.0mm · 0.38mm/px · 4 of 13 slices shown (1 of 3)]
[im 1/13]
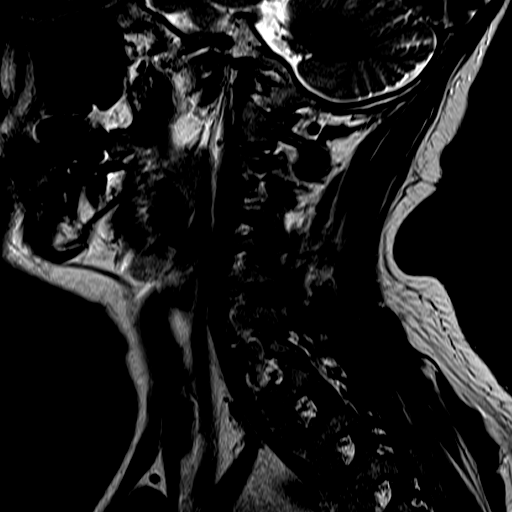
[im 5/13]
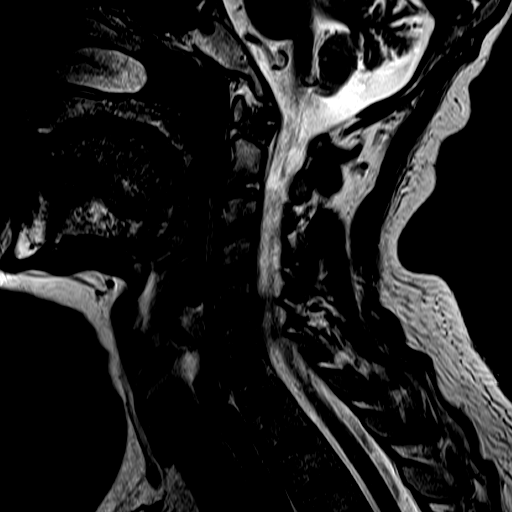
[im 9/13]
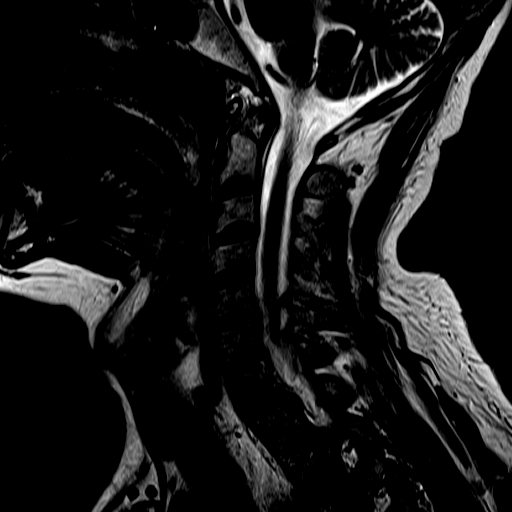
[im 13/13]
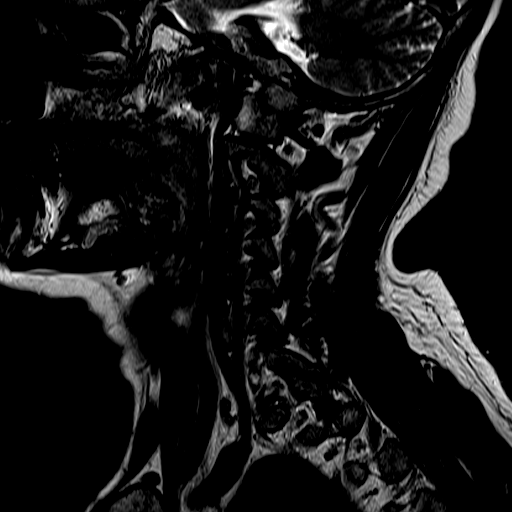

[Series 4: FLAIR · sagittal · 3.0mm · 0.43mm/px · 3 of 13 slices shown]
[im 1/13]
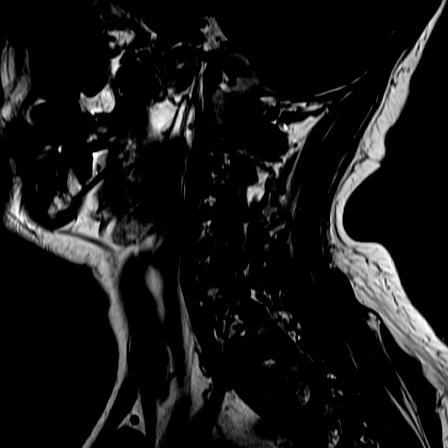
[im 7/13]
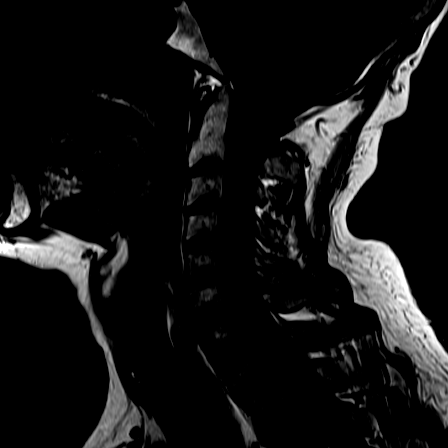
[im 13/13]
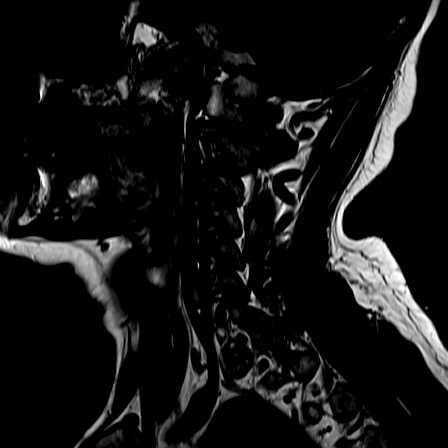

[Series 7: T2 · axial · 3.0mm · 0.23mm/px · z∈[-62,+51]mm · 8 of 36 slices shown (2 of 3)]
[im 1/36]
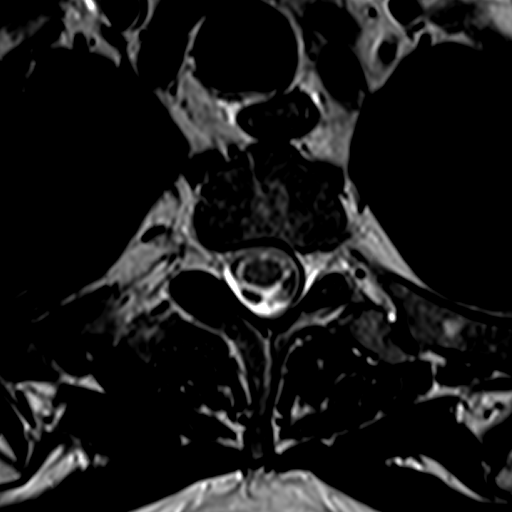
[im 6/36]
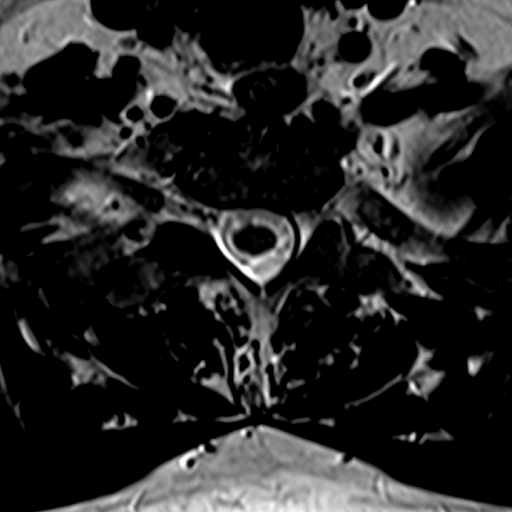
[im 11/36]
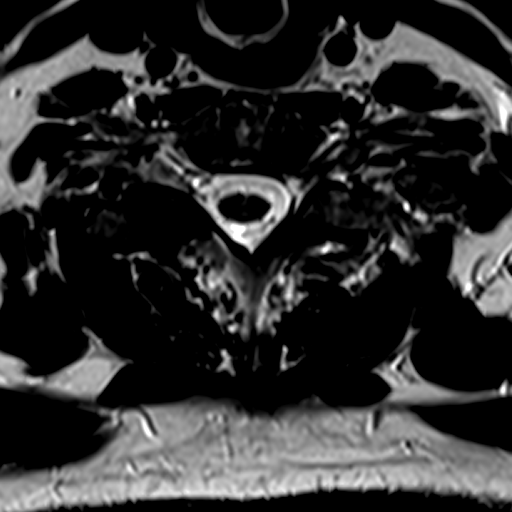
[im 17/36]
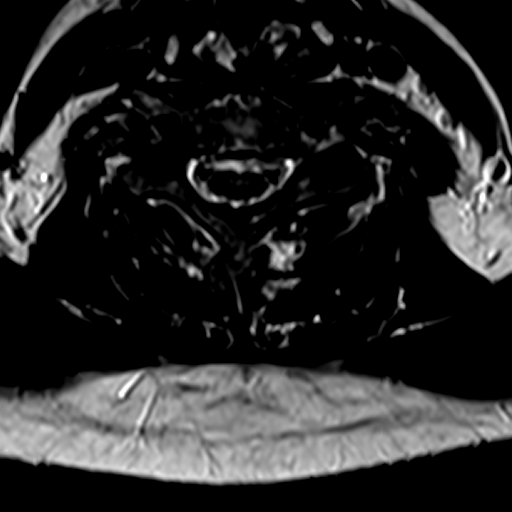
[im 19/36]
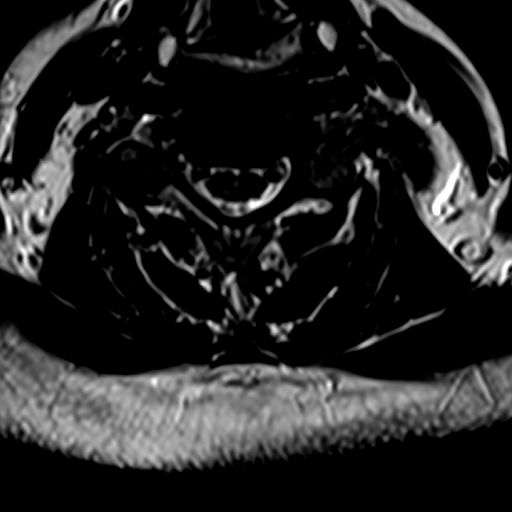
[im 25/36]
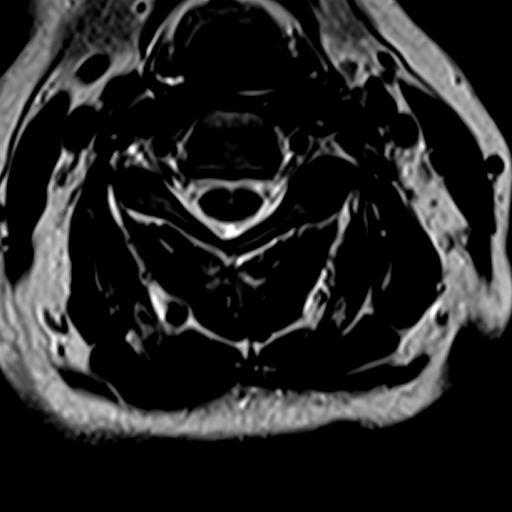
[im 30/36]
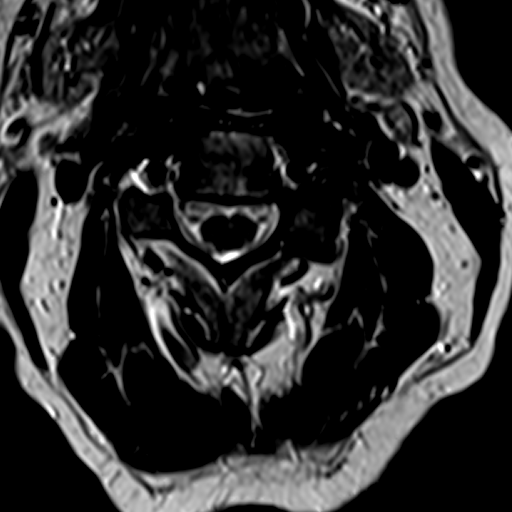
[im 36/36]
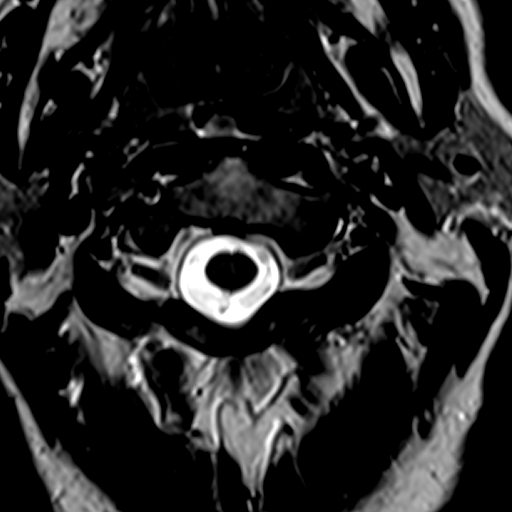

[Series 8: T2 · sagittal · 3.0mm · 0.30mm/px · 3 of 15 slices shown (3 of 3)]
[im 3/15]
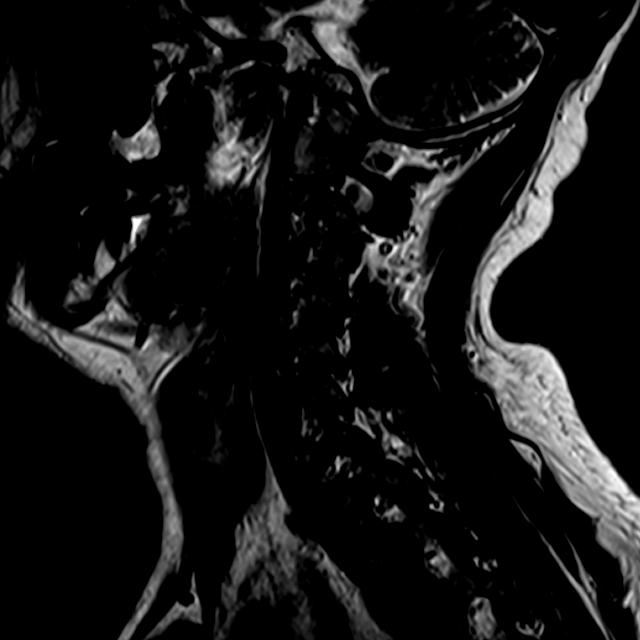
[im 9/15]
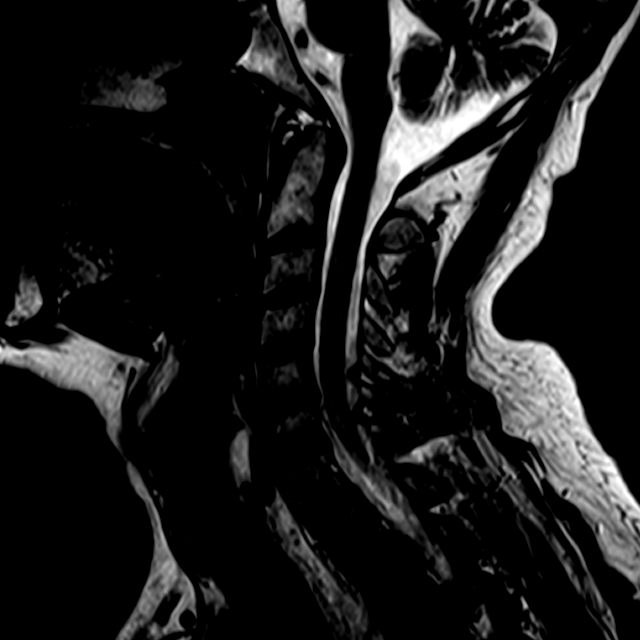
[im 15/15]
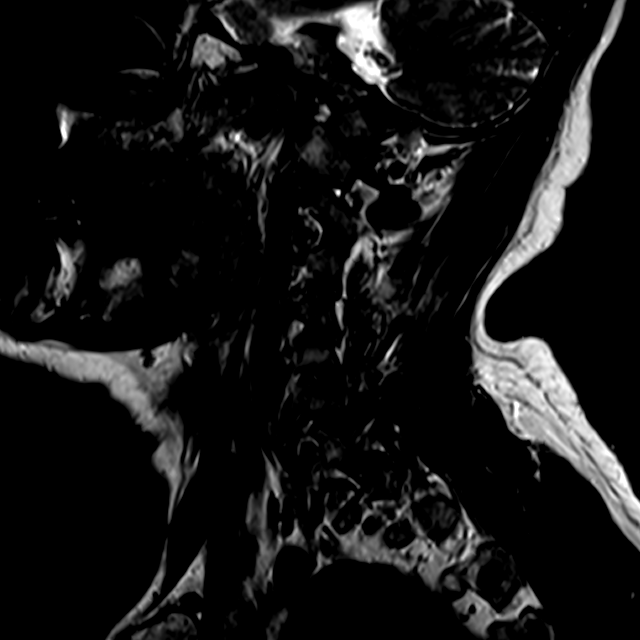

[18 of 48 positions shown; findings below may reference images not displayed]

FINDINGS: Normal signal is present in the cervical and upper thoracic spinal
cord to the lowest imaged level, T2-3. Marrow signal vertebral body
heights are normal. There is slight retrolisthesis at C5-6.

C2-3: Minimal facet hypertrophy is present bilaterally without
significant stenosis.

C3-4: Mild facet hypertrophy is worse on the left. There is mild
uncovertebral spurring bilaterally. Minimal left foraminal narrowing
is noted.

C4-5: Asymmetric left-sided facet hypertrophy is present. Mild
uncovertebral spurring is noted bilaterally. There is minimal left
foraminal stenosis.

C5-6: A broad-based disc osteophyte complex partially effaces the
ventral CSF. Uncovertebral spurring is worse on the left. Moderate
left facet hypertrophy is present. This contributes to moderate left
and mild right foraminal stenosis.

C6-7:  Negative.

C7-T1:  Negative.
IMPRESSION: 1. Moderate left and mild right foraminal stenosis at C5-6, the most
prominent level of multi-level spondylosis in the cervical spine.
2. Minimal left foraminal narrowing at C3-4 and C4-5.

## 2017-12-15 ENCOUNTER — Ambulatory Visit: Payer: BLUE CROSS/BLUE SHIELD | Admitting: Diagnostic Neuroimaging

## 2017-12-15 ENCOUNTER — Encounter: Payer: Self-pay | Admitting: Diagnostic Neuroimaging

## 2017-12-15 VITALS — BP 140/96 | HR 70 | Ht 69.0 in | Wt 157.8 lb

## 2017-12-15 DIAGNOSIS — M542 Cervicalgia: Secondary | ICD-10-CM | POA: Diagnosis not present

## 2017-12-15 DIAGNOSIS — G243 Spasmodic torticollis: Secondary | ICD-10-CM

## 2017-12-15 NOTE — Progress Notes (Signed)
[facility]  PATIENT: Dennis Miles DOB: February 02, 1951  REFERRING CLINICIAN: Merlyn Albert HISTORY FROM: patient  REASON FOR VISIT: new consult    HISTORICAL  CHIEF COMPLAINT:  Chief Complaint  Patient presents with  . NP  Hall Neck Pain / Radiculopathy    Notes has had this neck pain / L arm pain prior to CTS L wrist / L pinky finger gout tumor removed. 2015 per Dr. Amedeo Plenty.  Gabapentin no hlep, muscle relaxant helps sometimes.  Had last MRI 2016  [facility].      HISTORY OF PRESENT ILLNESS:   67 year old male here for evaluation of neck pain.  Patient reports rapid onset left-sided neck pain radiating to his shoulder starting in 2015.  Patient feels a pulling muscle spasm sensation in his left anterior neck.  Patient was also having some intermittent numbness and tingling in his hands, went to pain management and orthopedic clinic, had EMG nerve conduction study which showed carpal tunnel syndrome.  He had left carpal tunnel syndrome surgery which improved numbness in left hand.  However her neck pain continued.  Patient did MRI of the neck in 2017 which showed mild degenerative disc changes, but not enough to explain patient's symptoms or warrant surgical treatment.  Patient symptoms are continued.  He has tried chiropractic therapy.  Gabapentin.  PT.  Muscle relaxers.  No relief.  No slurred speech, trouble swallowing, gait difficulty, loss of motor function or coordination.  No prodromal accidents injuries or traumas.   REVIEW OF SYSTEMS: Full 14 system review of systems performed and negative with exception of: Fatigue memory loss insomnia numbness weakness joint pain cramps decreased energy change appetite ringing in ears.  ALLERGIES: Allergies  Allergen Reactions  . Codeine Nausea Only    HOME MEDICATIONS: Outpatient Medications Prior to Visit  Medication Sig Dispense Refill  . cetirizine (ZYRTEC) 10 MG tablet Take 10 mg by mouth daily as needed for allergies.      Marland Kitchen colchicine 0.6 MG tablet Take 0.6 mg by mouth 2 (two) times daily as needed. Using this up first, then will go to [place]    . febuxostat (ULORIC) 40 MG tablet Take 40 mg by mouth daily.    . fluticasone (FLONASE) 50 MCG/ACT nasal spray Place into both nostrils daily as needed for allergies or rhinitis.    Marland Kitchen levocetirizine (XYZAL) 5 MG tablet Take 5 mg by mouth every evening.    Marland Kitchen lisinopril (PRINIVIL,ZESTRIL) 10 MG tablet Take 10 mg by mouth daily.    . naproxen sodium (ALEVE) 220 MG tablet Take 220 mg by mouth daily as needed.    . pantoprazole (PROTONIX) 40 MG tablet TAKE ONE TABLET BY MOUTH DAILY 30 MINUTES BEFORE BREAKFAST AND ONE TABLET 30 MINUTES BEFORE SUPPER 60 tablet 11  . zolpidem (AMBIEN) 10 MG tablet Take 10 mg by mouth at bedtime. For sleep    . MITIGARE 0.6 MG CAPS 0.6 mg daily as needed.   0   No facility-administered medications prior to visit.     PAST MEDICAL HISTORY: Past Medical History:  Diagnosis Date  . Anxiety   . GERD (gastroesophageal reflux disease)   . Gout   . Headache(784.0)   . Hiatal hernia   . High cholesterol   . Hypertension   . Skin cancer     PAST SURGICAL HISTORY: Past Surgical History:  Procedure Laterality Date  . APPENDECTOMY    . COLONOSCOPY N/A 10/23/2012   Procedure: COLONOSCOPY;  Surgeon: Rogene Houston,  MD;  [facility];  Service: Endoscopy;  Laterality: N/A;  235  . ESOPHAGOGASTRODUODENOSCOPY  04/17/2011   Procedure: ESOPHAGOGASTRODUODENOSCOPY (EGD);  Surgeon: Rogene Houston, MD;  [facility]: [facility];  Service: Endoscopy;  Laterality: N/A;  8:30  . HAND SURGERY  05/28/2016  . INGUINAL HERNIA REPAIR Left 02/17/2013   Procedure: HERNIA REPAIR INGUINAL ADULT;  Surgeon: Jamesetta So, MD;  Location: [facility];  Service: General;  Laterality: Left;  . INSERTION OF MESH Left 02/17/2013   Procedure: INSERTION OF MESH;  Surgeon: Jamesetta So, MD;  Location: [facility];  Service: General;  Laterality: Left;  . sinus sugery       FAMILY HISTORY: Family History  Problem Relation Age of Onset  . Aneurysm Father   . Colon cancer Neg Hx     SOCIAL HISTORY:  Social History   Socioeconomic History  . Marital status: Divorced    Spouse name: Not on file  . Number of children: Not on file  . Years of education: Not on file  . Highest education level: Not on file  Occupational History  . Not on file  Social Needs  . Financial resource strain: Not on file  . Food insecurity:    Worry: Not on file    Inability: Not on file  . Transportation needs:    Medical: Not on file    Non-medical: Not on file  Tobacco Use  . Smoking status: Never Smoker  . Smokeless tobacco: Former Systems developer    Types: Chew  Substance and Sexual Activity  . Alcohol use: Yes    Alcohol/week: 0.6 oz    Types: 1 Cans of beer per week    Comment: occ  . Drug use: Never  . Sexual activity: Not on file  Lifestyle  . Physical activity:    Days per week: Not on file    Minutes per session: Not on file  . Stress: Not on file  Relationships  . Social connections:    Talks on phone: Not on file    Gets together: Not on file    Attends religious service: Not on file    Active member of club or organization: Not on file    Attends meetings of clubs or organizations: Not on file    Relationship status: Not on file  . Intimate partner violence:    Fear of current or ex partner: Not on file    Emotionally abused: Not on file    Physically abused: Not on file    Forced sexual activity: Not on file  Other Topics Concern  . Not on file  Social History Narrative   Lives at home with daughter.  Retired.  Education 12th grade.  Children 2.       PHYSICAL EXAM  GENERAL EXAM/CONSTITUTIONAL: Vitals:  Vitals:   12/15/17 0920  BP: (!) 140/96  Pulse: 70  Weight: 157 lb 12.8 oz (71.6 kg)  Height: 5\' 9"  (1.753 m)     Body mass index is 23.3 kg/m.  Visual Acuity Screening   Right eye Left eye Both eyes  Without correction: 20/30  20/200   With correction:        Patient is in no distress; well developed, nourished and groomed  NECK IS SLIGHTLY ROTATED TO THE LEFT; MODERATE TENDERNESS WITH ROTATION TO THE RIGHT  CARDIOVASCULAR:  Examination of carotid arteries is normal; no carotid bruits  Regular rate and rhythm, no murmurs  Examination of peripheral vascular system  by observation and palpation is normal  EYES:  Ophthalmoscopic exam of optic discs and posterior segments is normal; no papilledema or hemorrhages  MUSCULOSKELETAL:  Gait, strength, tone, movements noted in Neurologic exam below  NEUROLOGIC: MENTAL STATUS:  No flowsheet data found.  awake, alert, oriented to person, place and time  recent and remote memory intact  normal attention and concentration  language fluent, comprehension intact, naming intact,   fund of knowledge appropriate  CRANIAL NERVE:   2nd - no papilledema on fundoscopic exam  2nd, 3rd, 4th, 6th - pupils equal and reactive to light, visual fields full to confrontation, extraocular muscles intact, no nystagmus  5th - facial sensation symmetric  7th - facial strength symmetric  8th - hearing intact  9th - palate elevates symmetrically, uvula midline  11th - shoulder shrug symmetric  12th - tongue protrusion midline  MASKED FACIES  MOTOR:   SLIGHTLY INCREASED TONE IN BUE; NO BRADYKINESIA  normal bulk and tone, full strength in the BUE, BLE  SENSORY:   normal and symmetric to light touch, temperature, vibration  COORDINATION:   finger-nose-finger, fine finger movements normal  REFLEXES:   deep tendon reflexes 2+ and symmetric  GAIT/STATION:   narrow based gait; SLIGHTLY DECR ARM SWING; tandem stable; romberg is negative     DIAGNOSTIC DATA (LABS, IMAGING, TESTING) - I reviewed patient records, labs, notes, testing and imaging myself where available.  Lab Results  Component Value Date   WBC 6.7 08/29/2017   HGB 15.5 08/29/2017    HCT 43.7 08/29/2017   MCV 96.0 08/29/2017   PLT 208 08/29/2017      Component Value Date/Time   NA 140 08/29/2017 0955   K 4.9 08/29/2017 0955   CL 104 08/29/2017 0955   CO2 30 08/29/2017 0955   GLUCOSE 87 08/29/2017 0955   BUN 8 08/29/2017 0955   CREATININE 0.94 08/29/2017 0955   CALCIUM 9.3 08/29/2017 0955   PROT 6.7 08/29/2017 0955   ALBUMIN 4.2 09/26/2016 0910   AST 23 08/29/2017 0955   ALT 18 08/29/2017 0955   ALKPHOS 71 09/26/2016 0910   BILITOT 0.8 08/29/2017 0955   GFRNONAA 85 08/29/2017 0955   GFRAA 99 08/29/2017 0955   No results found for: CHOL, HDL, LDLCALC, LDLDIRECT, TRIG, CHOLHDL No results found for: HGBA1C No results found for: VITAMINB12 No results found for: TSH   07/18/15 MRI cervical [I reviewed images myself and agree with interpretation. -VRP]  1. Moderate left and mild right foraminal stenosis at C5-6, the most prominent level of multi-level spondylosis in the cervical spine. 2. Minimal left foraminal narrowing at C3-4 and C4-5.    ASSESSMENT AND PLAN  67 y.o. year old male here with left neck pain --> left clavicle / shoulder since 2015.   Ddx: neck pain --> possible cervical dystonia vs muscle strain / spasm vs cervical radiculopathy; subtle extrapyramidal sxs, but not clear  1. Neck pain   2. Cervical dystonia     PLAN:  - may consider PT, OT, massage therapy - may consider muscle relaxers as needed - may consider botox in future if symptoms significantly worsen  Return in about 6 months (around 06/17/2018).    Penni Bombard, MD 4/96/7591, 63:84 AM Certified in Neurology, Neurophysiology and Neuroimaging  [facility] [address], [place], [place] [address] [phone]

## 2017-12-15 NOTE — Patient Instructions (Signed)
-   may consider PT, OT, massage therapy  - may consider muscle relaxers as needed  - may consider botox in future if symptoms significantly worsen

## 2018-01-22 DIAGNOSIS — M542 Cervicalgia: Secondary | ICD-10-CM | POA: Diagnosis not present

## 2018-01-22 DIAGNOSIS — Z6824 Body mass index (BMI) 24.0-24.9, adult: Secondary | ICD-10-CM | POA: Diagnosis not present

## 2018-01-27 DIAGNOSIS — L814 Other melanin hyperpigmentation: Secondary | ICD-10-CM | POA: Diagnosis not present

## 2018-01-27 DIAGNOSIS — Z85828 Personal history of other malignant neoplasm of skin: Secondary | ICD-10-CM | POA: Diagnosis not present

## 2018-01-27 DIAGNOSIS — D2271 Melanocytic nevi of right lower limb, including hip: Secondary | ICD-10-CM | POA: Diagnosis not present

## 2018-01-27 DIAGNOSIS — B360 Pityriasis versicolor: Secondary | ICD-10-CM | POA: Diagnosis not present

## 2018-01-27 DIAGNOSIS — D225 Melanocytic nevi of trunk: Secondary | ICD-10-CM | POA: Diagnosis not present

## 2018-01-27 DIAGNOSIS — L821 Other seborrheic keratosis: Secondary | ICD-10-CM | POA: Diagnosis not present

## 2018-01-27 DIAGNOSIS — L570 Actinic keratosis: Secondary | ICD-10-CM | POA: Diagnosis not present

## 2018-02-17 NOTE — Progress Notes (Signed)
Office Visit Note  Patient: Dennis Miles             Date of Birth: 05-10-1951           MRN: [id number]             PCP: Celene Squibb, MD Referring: Celene Squibb, MD Visit Date: 03/03/2018 Occupation: @GUAROCC @  Subjective:  Medication monitoring  History of Present Illness: Dennis Miles is a 67 y.o. male with history of gout, osteoarthritis, and DDD.  He is taking allopurinol 100 mg by mouth daily and Mitigare 0.6 mg PRN.  He denies any recent gout flares.  He was previously taking Uloric but it became too expensive any switch to allopurinol 100 mg by mouth daily.  He states that he has been taking allopurinol for over 1 month and has not had any flares.  He is eliminated beef but eats pork very rarely.  He denies any joint pain or joint swelling at this time.  He states he occasionally has neck pain and radiculopathy bilaterally.   Activities of Daily Living:  Patient reports morning stiffness for 0 minutes.   Patient Denies nocturnal pain.  Difficulty dressing/grooming: Denies Difficulty climbing stairs: Denies Difficulty getting out of chair: Denies Difficulty using hands for taps, buttons, cutlery, and/or writing: Denies  Review of Systems  Constitutional: Positive for fatigue.  HENT: Negative for mouth sores, trouble swallowing, trouble swallowing and mouth dryness.   Eyes: Negative for pain, redness, visual disturbance and dryness.  Respiratory: Negative for shortness of breath and difficulty breathing.   Cardiovascular: Negative for chest pain and swelling in legs/feet.  Gastrointestinal: Negative for abdominal pain, constipation, diarrhea, nausea and vomiting.  Endocrine: Negative for increased urination.  Genitourinary: Negative for pelvic pain and urgency.  Musculoskeletal: Negative for arthralgias, joint pain, joint swelling, myalgias, morning stiffness, muscle tenderness and myalgias.  Skin: Negative for rash, hair loss, nodules/bumps, redness, skin tightness,  ulcers and sensitivity to sunlight.  Allergic/Immunologic: Negative for susceptible to infections.  Neurological: Negative for dizziness, light-headedness and headaches.  Hematological: Negative for bruising/bleeding tendency and swollen glands.  Psychiatric/Behavioral: Negative for depressed mood, confusion and sleep disturbance. The patient is not nervous/anxious.     PMFS History:  Patient Active Problem List   Diagnosis Date Noted  . Primary insomnia 09/26/2016  . DJD (degenerative joint disease), cervical 09/26/2016  . 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 Abdominal pain, left lower quadrant 10/19/2012  . GERD (gastroesophageal reflux disease) 04/15/2012  . Gout 04/01/2011  . Hypertension 04/01/2011  . Elevated cholesterol with high triglycerides 04/01/2011    Past Medical History:  Diagnosis Date  . Anxiety   . GERD (gastroesophageal reflux disease)   . Gout   . Headache(784.0)   . Hiatal hernia   . High cholesterol   . Hypertension   . Skin cancer     Family History  Problem Relation Age of Onset  . Aneurysm Father   . Colon cancer Neg Hx    Past Surgical History:  Procedure Laterality Date  . APPENDECTOMY    . COLONOSCOPY N/A 10/23/2012   Procedure: COLONOSCOPY;  Surgeon: Rogene Houston, MD;  [facility];  Service: Endoscopy;  Laterality: N/A;  235  . ESOPHAGOGASTRODUODENOSCOPY  04/17/2011   Procedure: ESOPHAGOGASTRODUODENOSCOPY (EGD);  Surgeon: Rogene Houston, MD;  [facility];  Service: Endoscopy;  Laterality: N/A;  8:30  . HAND SURGERY  05/28/2016  . INGUINAL HERNIA  REPAIR Left 02/17/2013   Procedure: HERNIA REPAIR INGUINAL ADULT;  Surgeon: Jamesetta So, MD;  Location: [facility];  Service: General;  Laterality: Left;  . INSERTION OF MESH Left 02/17/2013   Procedure: INSERTION OF MESH;  Surgeon: Jamesetta So, MD;  Location: [facility];  Service: General;  Laterality: Left;  . sinus sugery     Social History     Social History Narrative   Lives at home with daughter.  Retired.  Education 12th grade.  Children 2.      Objective: Vital Signs: BP 127/79 (BP Location: Right Arm, Patient Position: Sitting, Cuff Size: Normal)   Pulse 62   Resp 14   Ht 5\' 9"  (1.753 m)   Wt 163 lb 3.2 oz (74 kg)   BMI 24.10 kg/m    Physical Exam  Constitutional: He is oriented to person, place, and time. He appears well-developed and well-nourished.  HENT:  Head: Normocephalic and atraumatic.  Eyes: Pupils are equal, round, and reactive to light. Conjunctivae and EOM are normal.  Neck: Normal range of motion. Neck supple.  Cardiovascular: Normal rate, regular rhythm and normal heart sounds.  Pulmonary/Chest: Effort normal and breath sounds normal.  Abdominal: Soft. Bowel sounds are normal.  Neurological: He is alert and oriented to person, place, and time.  Skin: Skin is warm and dry. Capillary refill takes less than 2 seconds.  Psychiatric: He has a normal mood and affect. His behavior is normal.  Nursing note and vitals reviewed.    Musculoskeletal Exam: C-spine limited range of motion with lateral rotation.  Thoracic lumbar spine good range of motion.  No midline spinal tenderness.  No SI joint tenderness.  Shoulder joints, elbow joints, wrist joints, MCPs, PIPs, DIPs good range of motion with no synovitis.  He has PIP and DIP synovial thickening consistent with osteoarthritis of bilateral hands.  He has complete fist formation bilaterally.  Left hip discomfort with range of motion due to inguinal hernia.  Right hip full range of motion with no discomfort.  No tenderness of trochanter bursa bilaterally.  Knee joints good range of motion with no discomfort.  No warmth or effusion noted.  No tenderness or swelling of ankle joints.  No Achilles tendinitis.  CDAI Exam: CDAI Score: Not documented Patient Global Assessment: Not documented; Provider Global Assessment: Not documented Swollen: Not documented; Tender:  Not documented Joint Exam   Not documented   There is currently no information documented on the homunculus. Go to the Rheumatology activity and complete the homunculus joint exam.  Investigation: No additional findings.  Imaging: No results found.  Recent Labs: Lab Results  Component Value Date   WBC 6.7 08/29/2017   HGB 15.5 08/29/2017   PLT 208 08/29/2017   NA 140 08/29/2017   K 4.9 08/29/2017   CL 104 08/29/2017   CO2 30 08/29/2017   GLUCOSE 87 08/29/2017   BUN 8 08/29/2017   CREATININE 0.94 08/29/2017   BILITOT 0.8 08/29/2017   ALKPHOS 71 09/26/2016   AST 23 08/29/2017   ALT 18 08/29/2017   PROT 6.7 08/29/2017   ALBUMIN 4.2 09/26/2016   CALCIUM 9.3 08/29/2017   GFRAA 99 08/29/2017    Speciality Comments: No specialty comments available.  Procedures:  No procedures performed Allergies: Codeine   Assessment / Plan:     Visit Diagnoses: Idiopathic chronic gout of multiple sites with tophus -He has not had any recent gout flares.  It has been 1 year since his last flare.  He  was previously taking Uloric 40 mg by mouth daily.  We discussed the black box warning on Uloric.  Due to cost he switched to allopurinol 100 mg by mouth daily.  He has been taking allopurinol 100 mg by mouth daily for over 1 month without a flare.  He has a prescription for Mitigare 0.6 mg as needed but has not had to take it recently.  He has no joint pain or joint swelling at this time.  He has eliminated be from his diet and eats pork very rarely.  We discussed the importance of avoiding trigger foods and beer.  We will check uric acid level today.  If his uric acid is within desirable limits he will continue taking allopurinol 100 mg by mouth daily.  If his Uloric is elevated we we will further discuss treatment options.   - Plan: Uric acid  Primary osteoarthritis of both feet: He has osteoarthritic changes in bilateral feet.  He has no discomfort in his feet at this time.  DDD (degenerative  disc disease), cervical: He has limited range of motion of his C-spine with lateral rotation.  He has intermittent symptoms of radiculopathy bilaterally.  He has been evaluated by a neurosurgeon in the past he did not feel he is a surgical candidate.  He works on range of motion exercises on a regular basis.  Medication management -CBC and CMP will be checked today.  Plan: CBC with Differential/Platelet, COMPLETE METABOLIC PANEL WITH GFR, Uric acid   Other medical conditions are listed as follows:  Primary insomnia  Myalgia  History of hyperlipidemia  History of gastroesophageal reflux (GERD)  History of hypertension  Orders: Orders Placed This Encounter  Procedures  . CBC with Differential/Platelet  . COMPLETE METABOLIC PANEL WITH GFR  . Uric acid   No orders of the defined types were placed in this encounter.     Follow-Up Instructions: Return in about 6 months (around 09/02/2018) for Gout, Osteoarthritis, DDD.   Ofilia Neas, PA-C   I examined and evaluated the patient with Hazel Sams PA.Patient had no joint swelling on my examination today.  He continues to have some DIP and PIP thickening in his hands and feet consistent with osteoarthritis.  He had no gout flare.  We will check his uric acid today and will make decision regarding uric acid lowering agent. The plan of care was discussed as noted above.  Bo Merino, MD   Note - This record has been created using Editor, commissioning.  Chart creation errors have been sought, but may not always  have been located. Such creation errors do not reflect on  the standard of medical care.

## 2018-03-03 ENCOUNTER — Ambulatory Visit (INDEPENDENT_AMBULATORY_CARE_PROVIDER_SITE_OTHER): Payer: Medicare Other | Admitting: Rheumatology

## 2018-03-03 ENCOUNTER — Encounter: Payer: Self-pay | Admitting: Rheumatology

## 2018-03-03 VITALS — BP 127/79 | HR 62 | Resp 14 | Ht 69.0 in | Wt 163.2 lb

## 2018-03-03 DIAGNOSIS — M1A09X1 Idiopathic chronic gout, multiple sites, with tophus (tophi): Secondary | ICD-10-CM | POA: Diagnosis not present

## 2018-03-03 DIAGNOSIS — F5101 Primary insomnia: Secondary | ICD-10-CM | POA: Diagnosis not present

## 2018-03-03 DIAGNOSIS — Z8639 Personal history of other endocrine, nutritional and metabolic disease: Secondary | ICD-10-CM

## 2018-03-03 DIAGNOSIS — M791 Myalgia, unspecified site: Secondary | ICD-10-CM

## 2018-03-03 DIAGNOSIS — M19071 Primary osteoarthritis, right ankle and foot: Secondary | ICD-10-CM

## 2018-03-03 DIAGNOSIS — M19072 Primary osteoarthritis, left ankle and foot: Secondary | ICD-10-CM

## 2018-03-03 DIAGNOSIS — Z8679 Personal history of other diseases of the circulatory system: Secondary | ICD-10-CM | POA: Diagnosis not present

## 2018-03-03 DIAGNOSIS — M5030 Other cervical disc degeneration, unspecified cervical region: Secondary | ICD-10-CM

## 2018-03-03 DIAGNOSIS — Z79899 Other long term (current) drug therapy: Secondary | ICD-10-CM | POA: Diagnosis not present

## 2018-03-03 DIAGNOSIS — Z8719 Personal history of other diseases of the digestive system: Secondary | ICD-10-CM

## 2018-03-03 LAB — CBC WITH DIFFERENTIAL/PLATELET
BASOS ABS: 41 {cells}/uL (ref 0–200)
Basophils Relative: 0.5 %
EOS PCT: 5.7 %
Eosinophils Absolute: 467 cells/uL (ref 15–500)
HCT: 39.4 % (ref 38.5–50.0)
Hemoglobin: 13.6 g/dL (ref 13.2–17.1)
Lymphs Abs: 1829 cells/uL (ref 850–3900)
MCH: 34.1 pg — ABNORMAL HIGH (ref 27.0–33.0)
MCHC: 34.5 g/dL (ref 32.0–36.0)
MCV: 98.7 fL (ref 80.0–100.0)
MONOS PCT: 10.4 %
MPV: 11.8 fL (ref 7.5–12.5)
NEUTROS PCT: 61.1 %
Neutro Abs: 5010 cells/uL (ref 1500–7800)
PLATELETS: 230 10*3/uL (ref 140–400)
RBC: 3.99 10*6/uL — ABNORMAL LOW (ref 4.20–5.80)
RDW: 11.6 % (ref 11.0–15.0)
TOTAL LYMPHOCYTE: 22.3 %
WBC mixed population: 853 cells/uL (ref 200–950)
WBC: 8.2 10*3/uL (ref 3.8–10.8)

## 2018-03-03 LAB — COMPLETE METABOLIC PANEL WITH GFR
AG Ratio: 1.8 (calc) (ref 1.0–2.5)
ALKALINE PHOSPHATASE (APISO): 69 U/L (ref 40–115)
ALT: 19 U/L (ref 9–46)
AST: 23 U/L (ref 10–35)
Albumin: 4.1 g/dL (ref 3.6–5.1)
BILIRUBIN TOTAL: 0.5 mg/dL (ref 0.2–1.2)
BUN: 10 mg/dL (ref 7–25)
CHLORIDE: 102 mmol/L (ref 98–110)
CO2: 29 mmol/L (ref 20–32)
Calcium: 8.9 mg/dL (ref 8.6–10.3)
Creat: 1 mg/dL (ref 0.70–1.25)
GFR, Est African American: 91 mL/min/{1.73_m2} (ref >=60)
GFR, Est Non African American: 79 mL/min/{1.73_m2} (ref >=60)
Globulin: 2.3 g/dL (calc) (ref 1.9–3.7)
Glucose, Bld: 85 mg/dL (ref 65–99)
Potassium: 4.7 mmol/L (ref 3.5–5.3)
Sodium: 137 mmol/L (ref 135–146)
Total Protein: 6.4 g/dL (ref 6.1–8.1)

## 2018-03-03 LAB — URIC ACID: URIC ACID, SERUM: 6.1 mg/dL (ref 4.0–8.0)

## 2018-03-04 NOTE — Progress Notes (Signed)
CBC stable. CMP WNL. Uric acid is 6.1.  Ideally, uric acid should be below 6.  Please advise patient to increase to Allopurinol 200 mg by mouth daily.  He can take Colchicine 0.6 mg by mouth PRN if he experiences symptoms of a flare.

## 2018-03-06 ENCOUNTER — Telehealth: Payer: Self-pay | Admitting: Rheumatology

## 2018-03-06 NOTE — Telephone Encounter (Signed)
Patient called stating he was returning your call regarding his labwork results.  Patient states you can leave the results on his answering maching.  Patient also requested that his labwork results be sent to his PCP Dr. Nevada Crane.

## 2018-03-09 MED ORDER — ALLOPURINOL 100 MG PO TABS: 200.0000 mg | ORAL_TABLET | Freq: Every day | ORAL | 2 refills | Status: DC

## 2018-03-09 NOTE — Telephone Encounter (Signed)
Patient advised CBC stable. CMP WNL. Uric acid is 6.1. Ideally, uric acid should be below 6. Please advise patient to increase to Allopurinol 200 mg by mouth daily. He can take Colchicine 0.6 mg by mouth PRN if he experiences symptoms of a flare. Patient verbalized understanding.

## 2018-04-13 DIAGNOSIS — E782 Mixed hyperlipidemia: Secondary | ICD-10-CM | POA: Diagnosis not present

## 2018-04-13 DIAGNOSIS — I10 Essential (primary) hypertension: Secondary | ICD-10-CM | POA: Diagnosis not present

## 2018-04-13 DIAGNOSIS — E785 Hyperlipidemia, unspecified: Secondary | ICD-10-CM | POA: Diagnosis not present

## 2018-04-17 DIAGNOSIS — K219 Gastro-esophageal reflux disease without esophagitis: Secondary | ICD-10-CM | POA: Diagnosis not present

## 2018-04-17 DIAGNOSIS — G5601 Carpal tunnel syndrome, right upper limb: Secondary | ICD-10-CM | POA: Diagnosis not present

## 2018-04-17 DIAGNOSIS — G4700 Insomnia, unspecified: Secondary | ICD-10-CM | POA: Diagnosis not present

## 2018-04-17 DIAGNOSIS — E782 Mixed hyperlipidemia: Secondary | ICD-10-CM | POA: Diagnosis not present

## 2018-04-17 DIAGNOSIS — I10 Essential (primary) hypertension: Secondary | ICD-10-CM | POA: Diagnosis not present

## 2018-04-17 DIAGNOSIS — M542 Cervicalgia: Secondary | ICD-10-CM | POA: Diagnosis not present

## 2018-04-17 DIAGNOSIS — E790 Hyperuricemia without signs of inflammatory arthritis and tophaceous disease: Secondary | ICD-10-CM | POA: Diagnosis not present

## 2018-04-17 DIAGNOSIS — Z23 Encounter for immunization: Secondary | ICD-10-CM | POA: Diagnosis not present

## 2018-06-22 ENCOUNTER — Ambulatory Visit: Payer: Medicare Other | Admitting: Diagnostic Neuroimaging

## 2018-06-22 NOTE — Progress Notes (Deleted)
[facility]  PATIENT: Dennis Miles DOB: July 11, 1950  REFERRING CLINICIAN: Merlyn Albert HISTORY FROM: patient  REASON FOR VISIT: new consult    HISTORICAL  CHIEF COMPLAINT:  No chief complaint on file.   HISTORY OF PRESENT ILLNESS:   UPDATE (06/22/2018, VRP):  HPI: 68 year old male here for evaluation of neck pain.  Patient reports rapid onset left-sided neck pain radiating to his shoulder starting in 2015.  Patient feels a pulling muscle spasm sensation in his left anterior neck.  Patient was also having some intermittent numbness and tingling in his hands, went to pain management and orthopedic clinic, had EMG nerve conduction study which showed carpal tunnel syndrome.  He had left carpal tunnel syndrome surgery which improved numbness in left hand.  However her neck pain continued.  Patient did MRI of the neck in 2017 which showed mild degenerative disc changes, but not enough to explain patient's symptoms or warrant surgical treatment.  Patient symptoms are continued.  He has tried chiropractic therapy.  Gabapentin.  PT.  Muscle relaxers.  No relief.  No slurred speech, trouble swallowing, gait difficulty, loss of motor function or coordination.  No prodromal accidents injuries or traumas.   REVIEW OF SYSTEMS: Full 14 system review of systems performed and negative with exception of: Fatigue memory loss insomnia numbness weakness joint pain cramps decreased energy change appetite ringing in ears.  ALLERGIES: Allergies  Allergen Reactions  . Codeine Nausea Only    HOME MEDICATIONS: Outpatient Medications Prior to Visit  Medication Sig Dispense Refill  . allopurinol (ZYLOPRIM) 100 MG tablet Take 2 tablets (200 mg total) by mouth daily. 60 tablet 2  . cetirizine (ZYRTEC) 10 MG tablet Take 10 mg by mouth daily as needed for allergies.    . fluticasone (FLONASE) 50 MCG/ACT nasal spray Place into both nostrils daily as needed for allergies or rhinitis.    Marland Kitchen  lisinopril (PRINIVIL,ZESTRIL) 10 MG tablet Take 10 mg by mouth daily.    Marland Kitchen MITIGARE 0.6 MG CAPS 0.6 mg daily as needed.   0  . naproxen sodium (ALEVE) 220 MG tablet Take 220 mg by mouth daily as needed.    . pantoprazole (PROTONIX) 40 MG tablet TAKE ONE TABLET BY MOUTH DAILY 30 MINUTES BEFORE BREAKFAST AND ONE TABLET 30 MINUTES BEFORE SUPPER 60 tablet 11  . zolpidem (AMBIEN) 10 MG tablet Take 10 mg by mouth at bedtime. For sleep     No facility-administered medications prior to visit.     PAST MEDICAL HISTORY: Past Medical History:  Diagnosis Date  . Anxiety   . GERD (gastroesophageal reflux disease)   . Gout   . Headache(784.0)   . Hiatal hernia   . High cholesterol   . Hypertension   . Skin cancer     PAST SURGICAL HISTORY: Past Surgical History:  Procedure Laterality Date  . APPENDECTOMY    . COLONOSCOPY N/A 10/23/2012   Procedure: COLONOSCOPY;  Surgeon: Rogene Houston, MD;  [facility];  Service: Endoscopy;  Laterality: N/A;  235  . ESOPHAGOGASTRODUODENOSCOPY  04/17/2011   Procedure: ESOPHAGOGASTRODUODENOSCOPY (EGD);  Surgeon: Rogene Houston, MD;  Location: [facility];  Service: Endoscopy;  Laterality: N/A;  8:30  . HAND SURGERY  05/28/2016  . INGUINAL HERNIA REPAIR Left 02/17/2013   Procedure: HERNIA REPAIR INGUINAL ADULT;  Surgeon: Jamesetta So, MD;  Location: [facility];  Service: General;  Laterality: Left;  . INSERTION OF MESH Left 02/17/2013   Procedure: INSERTION OF MESH;  Surgeon: Jamesetta So, MD;  Location: [facility];  Service: General;  Laterality: Left;  . sinus sugery      FAMILY HISTORY: Family History  Problem Relation Age of Onset  . Aneurysm Father   . Colon cancer Neg Hx     SOCIAL HISTORY:  Social History   Socioeconomic History  . Marital status: Divorced    Spouse name: Not on file  . Number of children: Not on file  . Years of education: Not on file  . Highest education level: Not on file  Occupational History  . Not on file   Social Needs  . Financial resource strain: Not on file  . Food insecurity:    Worry: Not on file    Inability: Not on file  . Transportation needs:    Medical: Not on file    Non-medical: Not on file  Tobacco Use  . Smoking status: Never Smoker  . Smokeless tobacco: Former Systems developer    Types: Chew  Substance and Sexual Activity  . Alcohol use: Yes    Alcohol/week: 1.0 standard drinks    Types: 1 Cans of beer per week    Comment: occ  . Drug use: Never  . Sexual activity: Not on file  Lifestyle  . Physical activity:    Days per week: Not on file    Minutes per session: Not on file  . Stress: Not on file  Relationships  . Social connections:    Talks on phone: Not on file    Gets together: Not on file    Attends religious service: Not on file    Active member of club or organization: Not on file    Attends meetings of clubs or organizations: Not on file    Relationship status: Not on file  . Intimate partner violence:    Fear of current or ex partner: Not on file    Emotionally abused: Not on file    Physically abused: Not on file    Forced sexual activity: Not on file  Other Topics Concern  . Not on file  Social History Narrative   Lives at home with daughter.  Retired.  Education 12th grade.  Children 2.       PHYSICAL EXAM  GENERAL EXAM/CONSTITUTIONAL: Vitals:  There were no vitals filed for this visit. There is no height or weight on file to calculate BMI. No exam data present  Patient is in no distress; well developed, nourished and groomed  NECK IS SLIGHTLY ROTATED TO THE LEFT; MODERATE TENDERNESS WITH ROTATION TO THE RIGHT  CARDIOVASCULAR:  Examination of carotid arteries is normal; no carotid bruits  Regular rate and rhythm, no murmurs  Examination of peripheral vascular system by observation and palpation is normal  EYES:  Ophthalmoscopic exam of optic discs and posterior segments is normal; no papilledema or  hemorrhages  MUSCULOSKELETAL:  Gait, strength, tone, movements noted in Neurologic exam below  NEUROLOGIC: MENTAL STATUS:  No flowsheet data found.  awake, alert, oriented to person, place and time  recent and remote memory intact  normal attention and concentration  language fluent, comprehension intact, naming intact,   fund of knowledge appropriate  CRANIAL NERVE:   2nd - no papilledema on fundoscopic exam  2nd, 3rd, 4th, 6th - pupils equal and reactive to light, visual fields full to confrontation, extraocular muscles intact, no nystagmus  5th - facial sensation symmetric  7th - facial strength symmetric  8th - hearing intact  9th - palate  elevates symmetrically, uvula midline  11th - shoulder shrug symmetric  12th - tongue protrusion midline  MASKED FACIES  MOTOR:   SLIGHTLY INCREASED TONE IN BUE; NO BRADYKINESIA  normal bulk and tone, full strength in the BUE, BLE  SENSORY:   normal and symmetric to light touch, temperature, vibration  COORDINATION:   finger-nose-finger, fine finger movements normal  REFLEXES:   deep tendon reflexes 2+ and symmetric  GAIT/STATION:   narrow based gait; SLIGHTLY DECR ARM SWING; tandem stable; romberg is negative     DIAGNOSTIC DATA (LABS, IMAGING, TESTING) - I reviewed patient records, labs, notes, testing and imaging myself where available.  Lab Results  Component Value Date   WBC 8.2 03/03/2018   HGB 13.6 03/03/2018   HCT 39.4 03/03/2018   MCV 98.7 03/03/2018   PLT 230 03/03/2018      Component Value Date/Time   NA 137 03/03/2018 0941   K 4.7 03/03/2018 0941   CL 102 03/03/2018 0941   CO2 29 03/03/2018 0941   GLUCOSE 85 03/03/2018 0941   BUN 10 03/03/2018 0941   CREATININE 1.00 03/03/2018 0941   CALCIUM 8.9 03/03/2018 0941   PROT 6.4 03/03/2018 0941   ALBUMIN 4.2 09/26/2016 0910   AST 23 03/03/2018 0941   ALT 19 03/03/2018 0941   ALKPHOS 71 09/26/2016 0910   BILITOT 0.5 03/03/2018 0941    GFRNONAA 79 03/03/2018 0941   GFRAA 91 03/03/2018 0941   No results found for: CHOL, HDL, LDLCALC, LDLDIRECT, TRIG, CHOLHDL No results found for: HGBA1C No results found for: VITAMINB12 No results found for: TSH   07/18/15 MRI cervical [I reviewed images myself and agree with interpretation. -VRP]  1. Moderate left and mild right foraminal stenosis at C5-6, the most prominent level of multi-level spondylosis in the cervical spine. 2. Minimal left foraminal narrowing at C3-4 and C4-5.    ASSESSMENT AND PLAN  68 y.o. year old male here with left neck pain --> left clavicle / shoulder since 2015.   Ddx: neck pain --> possible cervical dystonia vs muscle strain / spasm vs cervical radiculopathy; subtle extrapyramidal sxs, but not clear  No diagnosis found.  PLAN:  - may consider PT, OT, massage therapy - may consider muscle relaxers as needed - may consider botox in future if symptoms significantly worsen  No follow-ups on file.    Penni Bombard, MD 2/56/3893, 73:42 PM Certified in Neurology, Neurophysiology and Neuroimaging  [facility] [address], [place], [place] [address] [phone]

## 2018-06-23 ENCOUNTER — Encounter: Payer: Self-pay | Admitting: Diagnostic Neuroimaging

## 2018-06-24 ENCOUNTER — Ambulatory Visit (INDEPENDENT_AMBULATORY_CARE_PROVIDER_SITE_OTHER): Payer: Medicare Other | Admitting: Internal Medicine

## 2018-06-24 ENCOUNTER — Encounter (INDEPENDENT_AMBULATORY_CARE_PROVIDER_SITE_OTHER): Payer: Self-pay | Admitting: Internal Medicine

## 2018-06-24 VITALS — BP 131/77 | HR 66 | Temp 98.1°F | Ht 70.0 in | Wt 162.8 lb

## 2018-06-24 DIAGNOSIS — K219 Gastro-esophageal reflux disease without esophagitis: Secondary | ICD-10-CM | POA: Diagnosis not present

## 2018-06-24 NOTE — Progress Notes (Signed)
Subjective:    Patient ID: Dennis Miles, male    DOB: 1950-07-16, 68 y.o.   MRN: [id number]  HPI Here today for f/u. Last see by Dr. Laural Golden in January of last year.  Hx of chronic GERD.    Hx of  esophagitis. Hx of esophageal stricture and dilated back in 2012.  States his appetite has remained good. GERD for the most part controlled with Protonix. Takes Protonix daily. Tried cutting back but had breakthru.  He remains active.     Last colonoscopy in 2014 with removal of a very small tubular adenoma. He can wait 10 yrs per records (2024)   04/07/2011 EGD/ED:  Indications:Patient is 68 year-old Caucasian male with chronic GERD maintenance PPI was been experiencing recurrent anus upper sternal area as well as Adam's apple and regurgitation. He is doing better with PPI. He has history of esophageal stricture which was last dilated in may 2002. At the present time he denies dysphagia. Impression: No evidence of erosive esophagitis.  Distal esophageal stricture dilated by passing 54 [place] Maloney dilator.  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  Biopsy: No eosinophilic esophagitis.   Review of Systems Past Medical History:  Diagnosis Date  . Anxiety   . GERD (gastroesophageal reflux disease)   . Gout   . Headache(784.0)   . Hiatal hernia   . High cholesterol   . Hypertension   . Skin cancer     Past Surgical History:  Procedure Laterality Date  . APPENDECTOMY    . COLONOSCOPY N/A 10/23/2012   Procedure: COLONOSCOPY;  Surgeon: Rogene Houston, MD;  [facility];  Service: Endoscopy;  Laterality: N/A;  235  . ESOPHAGOGASTRODUODENOSCOPY  04/17/2011   Procedure: ESOPHAGOGASTRODUODENOSCOPY (EGD);  Surgeon: Rogene Houston, MD;  [facility];  Service: Endoscopy;  Laterality: N/A;  8:30  . HAND  SURGERY  05/28/2016  . INGUINAL HERNIA REPAIR Left 02/17/2013   Procedure: HERNIA REPAIR INGUINAL ADULT;  Surgeon: Jamesetta So, MD;  Location: [facility];  Service: General;  Laterality: Left;  . INSERTION OF MESH Left 02/17/2013   Procedure: INSERTION OF MESH;  Surgeon: Jamesetta So, MD;  Location: [facility];  Service: General;  Laterality: Left;  . sinus sugery      Allergies  Allergen Reactions  . Codeine Nausea Only    Current Outpatient Medications on File Prior to Visit  Medication Sig Dispense Refill  . allopurinol (ZYLOPRIM) 100 MG tablet Take 2 tablets (200 mg total) by mouth daily. (Patient taking differently: Take 100 mg by mouth daily. ) 60 tablet 2  . cetirizine (ZYRTEC) 10 MG tablet Take 10 mg by mouth daily as needed for allergies.    . fluticasone (FLONASE) 50 MCG/ACT nasal spray Place into both nostrils daily as needed for allergies or rhinitis.    Marland Kitchen lisinopril (PRINIVIL,ZESTRIL) 10 MG tablet Take 10 mg by mouth daily.    Marland Kitchen MITIGARE 0.6 MG CAPS 0.6 mg daily as needed.   0  . naproxen sodium (ALEVE) 220 MG tablet Take 220 mg by mouth daily as needed.    . pantoprazole (PROTONIX) 40 MG tablet TAKE ONE TABLET BY MOUTH DAILY 30 MINUTES BEFORE BREAKFAST AND ONE TABLET 30 MINUTES BEFORE SUPPER 60 tablet 11  . zolpidem (AMBIEN) 10 MG tablet Take 10 mg by mouth at bedtime. For sleep     No current facility-administered medications on file prior to visit.  Objective:   Physical Exam Blood pressure 131/77, pulse 66, temperature 98.1 F (36.7 C), height 5\' 10"  (1.778 m), weight 162 lb 12.8 oz (73.8 kg). Alert and oriented. Skin warm and dry. Oral mucosa is moist.   . Sclera anicteric, conjunctivae is pink. Thyroid not enlarged. No cervical lymphadenopathy. Lungs clear. Heart regular rate and rhythm.  Abdomen is soft. Bowel sounds are positive. No hepatomegaly. No abdominal masses felt. No tenderness.  No edema to lower extremities.          Assessment & Plan:  Chronic  GERD.  Continue the Protonix. Next colonoscopy in 2024.  OV in 1 year

## 2018-06-24 NOTE — Patient Instructions (Signed)
Continue the Protonix. OV in 1 year.  

## 2018-08-24 DIAGNOSIS — M542 Cervicalgia: Secondary | ICD-10-CM | POA: Diagnosis not present

## 2018-08-24 DIAGNOSIS — G4700 Insomnia, unspecified: Secondary | ICD-10-CM | POA: Diagnosis not present

## 2018-08-24 DIAGNOSIS — I10 Essential (primary) hypertension: Secondary | ICD-10-CM | POA: Diagnosis not present

## 2018-08-24 DIAGNOSIS — K219 Gastro-esophageal reflux disease without esophagitis: Secondary | ICD-10-CM | POA: Diagnosis not present

## 2018-08-24 DIAGNOSIS — M1000 Idiopathic gout, unspecified site: Secondary | ICD-10-CM | POA: Diagnosis not present

## 2018-09-02 ENCOUNTER — Ambulatory Visit: Payer: Medicare Other | Admitting: Rheumatology

## 2018-10-22 ENCOUNTER — Telehealth: Payer: Self-pay | Admitting: *Deleted

## 2018-10-22 NOTE — Telephone Encounter (Signed)
Noted  

## 2018-10-22 NOTE — Telephone Encounter (Signed)
Pt has called back and has declined a doxy.me, pt states he does not feel he needs to be seen at this time.  Pt was asked if he'd like to r/s he declined that option as well and said she will call back if and when needed.

## 2018-10-22 NOTE — Telephone Encounter (Signed)
LVM #2 requesting a call back. Advised him that due to current COVID 19 pandemic, our office is severely reducing in person visits in order to minimize the risk to our patients and healthcare providers. We recommend to convert your appointment to a video visit. Advised him of office hours, closed tomorrow and Mon. for holiday. Requested he call back to convert to video or reschedule for August.

## 2018-10-27 ENCOUNTER — Ambulatory Visit: Payer: Medicare Other | Admitting: Diagnostic Neuroimaging

## 2018-11-02 DIAGNOSIS — L821 Other seborrheic keratosis: Secondary | ICD-10-CM | POA: Diagnosis not present

## 2018-11-02 DIAGNOSIS — L570 Actinic keratosis: Secondary | ICD-10-CM | POA: Diagnosis not present

## 2018-12-17 DIAGNOSIS — G4700 Insomnia, unspecified: Secondary | ICD-10-CM | POA: Diagnosis not present

## 2018-12-18 DIAGNOSIS — E782 Mixed hyperlipidemia: Secondary | ICD-10-CM | POA: Diagnosis not present

## 2018-12-18 DIAGNOSIS — I10 Essential (primary) hypertension: Secondary | ICD-10-CM | POA: Diagnosis not present

## 2018-12-18 DIAGNOSIS — E785 Hyperlipidemia, unspecified: Secondary | ICD-10-CM | POA: Diagnosis not present

## 2018-12-21 DIAGNOSIS — E782 Mixed hyperlipidemia: Secondary | ICD-10-CM | POA: Diagnosis not present

## 2018-12-21 DIAGNOSIS — K219 Gastro-esophageal reflux disease without esophagitis: Secondary | ICD-10-CM | POA: Diagnosis not present

## 2018-12-21 DIAGNOSIS — M542 Cervicalgia: Secondary | ICD-10-CM | POA: Diagnosis not present

## 2018-12-21 DIAGNOSIS — E790 Hyperuricemia without signs of inflammatory arthritis and tophaceous disease: Secondary | ICD-10-CM | POA: Diagnosis not present

## 2018-12-21 DIAGNOSIS — I10 Essential (primary) hypertension: Secondary | ICD-10-CM | POA: Diagnosis not present

## 2018-12-21 DIAGNOSIS — G5601 Carpal tunnel syndrome, right upper limb: Secondary | ICD-10-CM | POA: Diagnosis not present

## 2018-12-21 DIAGNOSIS — G4700 Insomnia, unspecified: Secondary | ICD-10-CM | POA: Diagnosis not present

## 2019-01-06 DIAGNOSIS — Z86010 Personal history of colonic polyps: Secondary | ICD-10-CM | POA: Diagnosis not present

## 2019-01-06 DIAGNOSIS — M25552 Pain in left hip: Secondary | ICD-10-CM | POA: Diagnosis not present

## 2019-01-08 NOTE — Progress Notes (Signed)
Office Visit Note  Patient: Dennis Miles             Date of Birth: Dec 02, 1950           MRN: [id number]             PCP: Celene Squibb, MD Referring: Celene Squibb, MD Visit Date: 01/12/2019 Occupation: @GUAROCC @  Subjective:  Left hip pain   History of Present Illness: Dennis Miles is a 68 y.o. male with history of gout, osteoarthritis, and DDD.  Patient is taking allopurinol 100 mg by mouth daily and Mitigare 0.6 mg as needed during gout flares.  He has not missed any doses of allopurinol. He has not need to take mitigare in 2 years.  He has not had any gout flares in 2 years.  He denies any tophi.   He presents today with left hip pain groin pain that started 1 month ago.  He denies any radiating pain or numbness.  He denies any injuries or falls.  He states that he was evaluated by his PCP Dr. Nevada Crane who prescribed Celebrex for pain relief.  He is only taken 4 doses of Celebrex and has not noticed any improvement yet.  He states that he has a history of an inguinal hernia and is concerned that the hernia has returned. He denies any other joint pain or joint swelling at this time.  Activities of Daily Living:  Patient reports morning stiffness for several hours.   Patient Denies nocturnal pain.  Difficulty dressing/grooming: Denies Difficulty climbing stairs: Denies Difficulty getting out of chair: Reports Difficulty using hands for taps, buttons, cutlery, and/or writing: Denies  Review of Systems  Constitutional: Positive for fatigue. Negative for night sweats.  HENT: Negative for mouth sores, mouth dryness and nose dryness.   Eyes: Negative for redness, itching and dryness.  Respiratory: Negative for cough, hemoptysis, shortness of breath, wheezing and difficulty breathing.   Cardiovascular: Negative for chest pain, palpitations, hypertension, irregular heartbeat and swelling in legs/feet.  Gastrointestinal: Negative for abdominal pain, blood in stool, constipation and  diarrhea.  Genitourinary: Negative for painful urination.  Musculoskeletal: Positive for arthralgias, joint pain and morning stiffness. Negative for joint swelling, myalgias, muscle weakness, muscle tenderness and myalgias.  Skin: Negative for color change, rash, hair loss, nodules/bumps, redness, skin tightness, ulcers and sensitivity to sunlight.  Allergic/Immunologic: Negative for susceptible to infections.  Neurological: Negative for dizziness, fainting, light-headedness, headaches, memory loss, night sweats and weakness.  Hematological: Negative for swollen glands.  Psychiatric/Behavioral: Negative for depressed mood, confusion and sleep disturbance. The patient is not nervous/anxious.     PMFS History:  Patient Active Problem List   Diagnosis Date Noted  . Primary insomnia 09/26/2016  . DJD (degenerative joint disease), cervical 09/26/2016  . 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 Abdominal pain, left lower quadrant 10/19/2012  . GERD (gastroesophageal reflux disease) 04/15/2012  . Gout 04/01/2011  . Hypertension 04/01/2011  . Elevated cholesterol with high triglycerides 04/01/2011    Past Medical History:  Diagnosis Date  . Anxiety   . GERD (gastroesophageal reflux disease)   . Gout   . Headache(784.0)   . Hiatal hernia   . High cholesterol   . Hypertension   . Skin cancer     Family History  Problem Relation Age of Onset  . Aneurysm Father   . Colon cancer Neg Hx    Past Surgical History:  Procedure Laterality Date  . APPENDECTOMY    .  COLONOSCOPY N/A 10/23/2012   Procedure: COLONOSCOPY;  Surgeon: Rogene Houston, MD;  [facility];  Service: Endoscopy;  Laterality: N/A;  235  . ESOPHAGOGASTRODUODENOSCOPY  04/17/2011   Procedure: ESOPHAGOGASTRODUODENOSCOPY (EGD);  Surgeon: Rogene Houston, MD;  [facility];  Service: Endoscopy;  Laterality: N/A;  8:30  . HAND SURGERY  05/28/2016  . INGUINAL HERNIA REPAIR Left  02/17/2013   Procedure: HERNIA REPAIR INGUINAL ADULT;  Surgeon: Jamesetta So, MD;  [facility]: [facility];  Service: General;  Laterality: Left;  . INSERTION OF MESH Left 02/17/2013   Procedure: INSERTION OF MESH;  Surgeon: Jamesetta So, MD;  Location: [facility];  Service: General;  Laterality: Left;  . sinus sugery     Social History   Social History Narrative   Lives at home with daughter.  Retired.  Education 12th grade.  Children 2.      There is no immunization history on file for this patient.   Objective: Vital Signs: BP 127/83 (BP Location: Left Arm, Patient Position: Sitting, Cuff Size: Normal)   Pulse 64   Resp 13   Ht 5\' 9"  (1.753 m)   Wt 156 lb 9.6 oz (71 kg)   BMI 23.13 kg/m    Physical Exam Vitals signs and nursing note reviewed.  Constitutional:      Appearance: He is well-developed.  HENT:     Head: Normocephalic and atraumatic.  Eyes:     Conjunctiva/sclera: Conjunctivae normal.     Pupils: Pupils are equal, round, and reactive to light.  Neck:     Musculoskeletal: Normal range of motion and neck supple.  Cardiovascular:     Rate and Rhythm: Normal rate and regular rhythm.     Heart sounds: Normal heart sounds.  Pulmonary:     Effort: Pulmonary effort is normal.     Breath sounds: Normal breath sounds.  Abdominal:     General: Bowel sounds are normal.     Palpations: Abdomen is soft.  Skin:    General: Skin is warm and dry.     Capillary Refill: Capillary refill takes less than 2 seconds.  Neurological:     Mental Status: He is alert and oriented to person, place, and time.  Psychiatric:        Behavior: Behavior normal.      Musculoskeletal Exam: C-spine limited ROM with lateral rotation. Thoracic spine and lumbar spine good ROM.  Shoulder joints, elbow joints, wrist joints, MCPs, PIPs, and DIPs good ROM with no synovitis.  Hyperextension of PIP joints. Right hip has good ROM with no discomfort.  Left hip has good ROM with discomfort.   Knee joints,  ankle joints, MTPs, PIPs, and DIPs good ROM with no synovitis.  No warmth or effusion of knee joints.  No tenderness or swelling of ankle joints.  No tenderness over trochanteric bursa bilaterally.    CDAI Exam: CDAI Score: - Patient Global: -; Provider Global: - Swollen: -; Tender: - Joint Exam   No joint exam has been documented for this visit   There is currently no information documented on the homunculus. Go to the Rheumatology activity and complete the homunculus joint exam.  Investigation: No additional findings.  Imaging: No results found.  Recent Labs: Lab Results  Component Value Date   WBC 8.2 03/03/2018   HGB 13.6 03/03/2018   PLT 230 03/03/2018   NA 137 03/03/2018   K 4.7 03/03/2018   CL 102 03/03/2018   CO2 29 03/03/2018  GLUCOSE 85 03/03/2018   BUN 10 03/03/2018   CREATININE 1.00 03/03/2018   BILITOT 0.5 03/03/2018   ALKPHOS 71 09/26/2016   AST 23 03/03/2018   ALT 19 03/03/2018   PROT 6.4 03/03/2018   ALBUMIN 4.2 09/26/2016   CALCIUM 8.9 03/03/2018   GFRAA 91 03/03/2018    Speciality Comments: No specialty comments available.  Procedures:  No procedures performed Allergies: Codeine   Assessment / Plan:     Visit Diagnoses: Idiopathic chronic gout of multiple sites with tophus -He has not had any recent gout flares.  His last flare was 2 years ago.  He has been compliant taking allopurinol 100 mg 1 tablet by mouth daily.  He has not needed to take mitigare in 2 years for a flare. Uric acid was 6.1 on 03/03/18. We will check uric acid level today.  He was advised to notify us if he develops signs or symptoms of a gout flare.  He will continue taking allopurinol as prescribed.  A refill of allopurinol was sent to the pharmacy.  He will follow up in 6 months.  Plan: Uric acid  Medication management - Allopurinol 100 mg po daily and mitigare 0.6 mg po as needed.  CBC, CMP, and uric acid will be checked today. - Plan: CBC with Differential/Platelet, COMPLETE  METABOLIC PANEL WITH GFR, Uric acid  Primary osteoarthritis of both feet - He has no feet pain or joint swelling at this time.  He wears proper fitting shoes.   DDD (degenerative disc disease), cervical - He has limited ROM with lateral rotation.  He has no symptoms of radiculopathy.   Pain in left hip - He presents today with left hip joint pain.  He has good ROM of hte left hip with discomfort.  No tenderness over the left trochanteric bursa or ischial bursa.  He was evaluated by his PCP, Dr. Nevada Crane, who prescribed Celebrex.  He has taken 4 doses but has not noticed any improvement yet.  He has a history of a left inguinal hernia, and he is concerned the hernia has returned.  We will obtain a x-ray of the left hip today. He has mild superolateral joint space narrowing.  He was advised to follow up with PCP for further evaluation and treatment. Plan: XR HIP UNILAT W OR W/O PELVIS 2-3 VIEWS LEFT  Other medical conditions are listed as follows:   Primary insomnia   History of hyperlipidemia   History of gastroesophageal reflux (GERD)   History of hypertension    Orders: Orders Placed This Encounter  Procedures  . XR HIP UNILAT W OR W/O PELVIS 2-3 VIEWS LEFT  . CBC with Differential/Platelet  . COMPLETE METABOLIC PANEL WITH GFR  . Uric acid   Meds ordered this encounter  Medications  . allopurinol (ZYLOPRIM) 100 MG tablet    Sig: Take 1 tablet (100 mg total) by mouth daily.    Dispense:  90 tablet    Refill:  0    Face-to-face time spent with patient was 30 minutes. Greater than 50% of time was spent in counseling and coordination of care.  Follow-Up Instructions: Return in about 6 months (around 07/15/2019) for Gout, Osteoarthritis.   Ofilia Neas, PA-C  I examined and evaluated the patient with Hazel Sams PA.  Patient has not had any gout flares in a while.  He has been experiencing some left inguinal pain and discomfort.  The x-ray of the hip joint was unremarkable except  for mild osteoarthritic changes.  He has had history of inguinal hernia in the past.  We have advised him to follow-up with his PCP.  The plan of care was discussed as noted above.  Bo Merino, MD  Note - This record has been created using Editor, commissioning.  Chart creation errors have been sought, but may not always  have been located. Such creation errors do not reflect on  the standard of medical care.

## 2019-01-12 ENCOUNTER — Ambulatory Visit: Payer: Self-pay

## 2019-01-12 ENCOUNTER — Ambulatory Visit (INDEPENDENT_AMBULATORY_CARE_PROVIDER_SITE_OTHER): Payer: Medicare Other | Admitting: Rheumatology

## 2019-01-12 ENCOUNTER — Encounter: Payer: Self-pay | Admitting: Rheumatology

## 2019-01-12 ENCOUNTER — Other Ambulatory Visit: Payer: Self-pay

## 2019-01-12 VITALS — BP 127/83 | HR 64 | Resp 13 | Ht 69.0 in | Wt 156.6 lb

## 2019-01-12 DIAGNOSIS — M1A09X1 Idiopathic chronic gout, multiple sites, with tophus (tophi): Secondary | ICD-10-CM

## 2019-01-12 DIAGNOSIS — M5030 Other cervical disc degeneration, unspecified cervical region: Secondary | ICD-10-CM | POA: Diagnosis not present

## 2019-01-12 DIAGNOSIS — Z8719 Personal history of other diseases of the digestive system: Secondary | ICD-10-CM | POA: Diagnosis not present

## 2019-01-12 DIAGNOSIS — F5101 Primary insomnia: Secondary | ICD-10-CM | POA: Diagnosis not present

## 2019-01-12 DIAGNOSIS — M19072 Primary osteoarthritis, left ankle and foot: Secondary | ICD-10-CM

## 2019-01-12 DIAGNOSIS — Z8679 Personal history of other diseases of the circulatory system: Secondary | ICD-10-CM | POA: Diagnosis not present

## 2019-01-12 DIAGNOSIS — M25552 Pain in left hip: Secondary | ICD-10-CM | POA: Diagnosis not present

## 2019-01-12 DIAGNOSIS — Z79899 Other long term (current) drug therapy: Secondary | ICD-10-CM | POA: Diagnosis not present

## 2019-01-12 DIAGNOSIS — Z8639 Personal history of other endocrine, nutritional and metabolic disease: Secondary | ICD-10-CM | POA: Diagnosis not present

## 2019-01-12 DIAGNOSIS — M19071 Primary osteoarthritis, right ankle and foot: Secondary | ICD-10-CM | POA: Diagnosis not present

## 2019-01-12 MED ORDER — ALLOPURINOL 100 MG PO TABS: 100.0000 mg | ORAL_TABLET | Freq: Every day | ORAL | 0 refills | Status: DC

## 2019-01-13 LAB — CBC WITH DIFFERENTIAL/PLATELET
Absolute Monocytes: 722 {cells}/uL (ref 200–950)
Basophils Absolute: 33 {cells}/uL (ref 0–200)
Basophils Relative: 0.5 %
Eosinophils Absolute: 312 {cells}/uL (ref 15–500)
Eosinophils Relative: 4.8 %
HCT: 41 % (ref 38.5–50.0)
Hemoglobin: 14.1 g/dL (ref 13.2–17.1)
Lymphs Abs: 1729 {cells}/uL (ref 850–3900)
MCH: 34.7 pg — ABNORMAL HIGH (ref 27.0–33.0)
MCHC: 34.4 g/dL (ref 32.0–36.0)
MCV: 101 fL — ABNORMAL HIGH (ref 80.0–100.0)
MPV: 12.5 fL (ref 7.5–12.5)
Monocytes Relative: 11.1 %
Neutro Abs: 3705 {cells}/uL (ref 1500–7800)
Neutrophils Relative %: 57 %
Platelets: 201 Thousand/uL (ref 140–400)
RBC: 4.06 Million/uL — ABNORMAL LOW (ref 4.20–5.80)
RDW: 12.1 % (ref 11.0–15.0)
Total Lymphocyte: 26.6 %
WBC: 6.5 Thousand/uL (ref 3.8–10.8)

## 2019-01-13 LAB — COMPLETE METABOLIC PANEL WITHOUT GFR
AG Ratio: 1.6 (calc) (ref 1.0–2.5)
ALT: 18 U/L (ref 9–46)
AST: 24 U/L (ref 10–35)
Albumin: 4.2 g/dL (ref 3.6–5.1)
Alkaline phosphatase (APISO): 57 U/L (ref 35–144)
BUN: 7 mg/dL (ref 7–25)
CO2: 29 mmol/L (ref 20–32)
Calcium: 9.5 mg/dL (ref 8.6–10.3)
Chloride: 103 mmol/L (ref 98–110)
Creat: 1.07 mg/dL (ref 0.70–1.25)
GFR, Est African American: 84 mL/min/1.73m2
GFR, Est Non African American: 72 mL/min/1.73m2
Globulin: 2.6 g/dL (ref 1.9–3.7)
Glucose, Bld: 84 mg/dL (ref 65–99)
Potassium: 4.9 mmol/L (ref 3.5–5.3)
Sodium: 141 mmol/L (ref 135–146)
Total Bilirubin: 0.6 mg/dL (ref 0.2–1.2)
Total Protein: 6.8 g/dL (ref 6.1–8.1)

## 2019-01-13 LAB — URIC ACID: Uric Acid, Serum: 6.7 mg/dL (ref 4.0–8.0)

## 2019-01-13 NOTE — Progress Notes (Signed)
Uric acid is elevated-6.7. He takes allopurinol 100 mg po daily.   RBC count mildly low but trending up.  CMP WNL

## 2019-01-20 ENCOUNTER — Other Ambulatory Visit (HOSPITAL_COMMUNITY): Payer: Self-pay | Admitting: Internal Medicine

## 2019-01-20 DIAGNOSIS — E790 Hyperuricemia without signs of inflammatory arthritis and tophaceous disease: Secondary | ICD-10-CM | POA: Diagnosis not present

## 2019-01-20 DIAGNOSIS — R1030 Lower abdominal pain, unspecified: Secondary | ICD-10-CM | POA: Diagnosis not present

## 2019-01-20 DIAGNOSIS — M25552 Pain in left hip: Secondary | ICD-10-CM | POA: Diagnosis not present

## 2019-01-20 DIAGNOSIS — Z86010 Personal history of colonic polyps: Secondary | ICD-10-CM | POA: Diagnosis not present

## 2019-02-04 ENCOUNTER — Ambulatory Visit (HOSPITAL_COMMUNITY)
Admission: RE | Admit: 2019-02-04 | Discharge: 2019-02-04 | Disposition: A | Discharge status: Home / Self Care | Payer: Medicare Other | Source: Ambulatory Visit | Attending: Internal Medicine | Admitting: Internal Medicine

## 2019-02-04 ENCOUNTER — Other Ambulatory Visit: Payer: Self-pay

## 2019-02-04 DIAGNOSIS — R1032 Left lower quadrant pain: Secondary | ICD-10-CM | POA: Diagnosis not present

## 2019-02-04 DIAGNOSIS — R1030 Lower abdominal pain, unspecified: Secondary | ICD-10-CM | POA: Insufficient documentation

## 2019-02-04 MED ORDER — IOHEXOL 300 MG/ML  SOLN
100.0000 mL | Freq: Once | INTRAMUSCULAR | Status: AC | PRN
  Administered 2019-02-04: 100 mL via INTRAVENOUS

## 2019-03-12 DIAGNOSIS — E782 Mixed hyperlipidemia: Secondary | ICD-10-CM | POA: Diagnosis not present

## 2019-03-12 DIAGNOSIS — E785 Hyperlipidemia, unspecified: Secondary | ICD-10-CM | POA: Diagnosis not present

## 2019-03-12 DIAGNOSIS — M1000 Idiopathic gout, unspecified site: Secondary | ICD-10-CM | POA: Diagnosis not present

## 2019-03-12 DIAGNOSIS — I10 Essential (primary) hypertension: Secondary | ICD-10-CM | POA: Diagnosis not present

## 2019-03-12 DIAGNOSIS — Z23 Encounter for immunization: Secondary | ICD-10-CM | POA: Diagnosis not present

## 2019-03-15 DIAGNOSIS — K219 Gastro-esophageal reflux disease without esophagitis: Secondary | ICD-10-CM | POA: Diagnosis not present

## 2019-03-15 DIAGNOSIS — I10 Essential (primary) hypertension: Secondary | ICD-10-CM | POA: Diagnosis not present

## 2019-03-15 DIAGNOSIS — G4700 Insomnia, unspecified: Secondary | ICD-10-CM | POA: Diagnosis not present

## 2019-03-15 DIAGNOSIS — E790 Hyperuricemia without signs of inflammatory arthritis and tophaceous disease: Secondary | ICD-10-CM | POA: Diagnosis not present

## 2019-03-15 DIAGNOSIS — M542 Cervicalgia: Secondary | ICD-10-CM | POA: Diagnosis not present

## 2019-03-15 DIAGNOSIS — G5601 Carpal tunnel syndrome, right upper limb: Secondary | ICD-10-CM | POA: Diagnosis not present

## 2019-03-15 DIAGNOSIS — E782 Mixed hyperlipidemia: Secondary | ICD-10-CM | POA: Diagnosis not present

## 2019-03-18 DIAGNOSIS — D2271 Melanocytic nevi of right lower limb, including hip: Secondary | ICD-10-CM | POA: Diagnosis not present

## 2019-03-18 DIAGNOSIS — L570 Actinic keratosis: Secondary | ICD-10-CM | POA: Diagnosis not present

## 2019-03-18 DIAGNOSIS — L814 Other melanin hyperpigmentation: Secondary | ICD-10-CM | POA: Diagnosis not present

## 2019-03-18 DIAGNOSIS — L821 Other seborrheic keratosis: Secondary | ICD-10-CM | POA: Diagnosis not present

## 2019-03-18 DIAGNOSIS — Z23 Encounter for immunization: Secondary | ICD-10-CM | POA: Diagnosis not present

## 2019-03-18 DIAGNOSIS — D225 Melanocytic nevi of trunk: Secondary | ICD-10-CM | POA: Diagnosis not present

## 2019-03-18 DIAGNOSIS — S40812A Abrasion of left upper arm, initial encounter: Secondary | ICD-10-CM | POA: Diagnosis not present

## 2019-03-18 DIAGNOSIS — D485 Neoplasm of uncertain behavior of skin: Secondary | ICD-10-CM | POA: Diagnosis not present

## 2019-03-18 DIAGNOSIS — C44311 Basal cell carcinoma of skin of nose: Secondary | ICD-10-CM | POA: Diagnosis not present

## 2019-03-18 DIAGNOSIS — Z85828 Personal history of other malignant neoplasm of skin: Secondary | ICD-10-CM | POA: Diagnosis not present

## 2019-06-23 DIAGNOSIS — Z1329 Encounter for screening for other suspected endocrine disorder: Secondary | ICD-10-CM | POA: Diagnosis not present

## 2019-06-23 DIAGNOSIS — I10 Essential (primary) hypertension: Secondary | ICD-10-CM | POA: Diagnosis not present

## 2019-06-23 DIAGNOSIS — E785 Hyperlipidemia, unspecified: Secondary | ICD-10-CM | POA: Diagnosis not present

## 2019-06-23 DIAGNOSIS — E782 Mixed hyperlipidemia: Secondary | ICD-10-CM | POA: Diagnosis not present

## 2019-06-28 ENCOUNTER — Other Ambulatory Visit: Payer: Self-pay

## 2019-06-28 ENCOUNTER — Encounter (INDEPENDENT_AMBULATORY_CARE_PROVIDER_SITE_OTHER): Payer: Self-pay | Admitting: Gastroenterology

## 2019-06-28 ENCOUNTER — Ambulatory Visit (INDEPENDENT_AMBULATORY_CARE_PROVIDER_SITE_OTHER): Payer: Medicare Other | Admitting: Gastroenterology

## 2019-06-28 VITALS — BP 122/80 | HR 61 | Temp 98.1°F | Ht 70.0 in | Wt 168.1 lb

## 2019-06-28 DIAGNOSIS — I10 Essential (primary) hypertension: Secondary | ICD-10-CM | POA: Diagnosis not present

## 2019-06-28 DIAGNOSIS — Z86010 Personal history of colonic polyps: Secondary | ICD-10-CM | POA: Diagnosis not present

## 2019-06-28 DIAGNOSIS — G5601 Carpal tunnel syndrome, right upper limb: Secondary | ICD-10-CM | POA: Diagnosis not present

## 2019-06-28 DIAGNOSIS — G4700 Insomnia, unspecified: Secondary | ICD-10-CM | POA: Diagnosis not present

## 2019-06-28 DIAGNOSIS — R1310 Dysphagia, unspecified: Secondary | ICD-10-CM

## 2019-06-28 DIAGNOSIS — E782 Mixed hyperlipidemia: Secondary | ICD-10-CM | POA: Diagnosis not present

## 2019-06-28 DIAGNOSIS — E790 Hyperuricemia without signs of inflammatory arthritis and tophaceous disease: Secondary | ICD-10-CM | POA: Diagnosis not present

## 2019-06-28 DIAGNOSIS — M542 Cervicalgia: Secondary | ICD-10-CM | POA: Diagnosis not present

## 2019-06-28 DIAGNOSIS — K219 Gastro-esophageal reflux disease without esophagitis: Secondary | ICD-10-CM

## 2019-06-28 MED ORDER — PANTOPRAZOLE SODIUM 40 MG PO TBEC: 40.0000 mg | DELAYED_RELEASE_TABLET | Freq: Every day | ORAL | 3 refills | Status: DC

## 2019-06-28 NOTE — Progress Notes (Addendum)
Patient profile: Dennis Miles is a 69 y.o. male seen for f.up of gerd.    History of Present Illness: HEZRON Miles is seen today for yearly follow-up of GERD and reflux.  He reports having one episode within the past year of dysphagia to foods where he had to regurgitate.  He does not feel that this is occurring often.  He does sometimes avoid taking his fish oil or multivitamin due to the size of large pills and tendency to have dysphagia to large pills.  He usually is able to pass pills when drinking liquids though.  He denies any issues with nausea or vomiting.  Very rare GERD symptoms approximately twice a month that he needs tums, otherwise symptoms do well with Protonix 40 mg once a day.  He does drink 2-3 beers a day.  He has started on Celebrex since his last visit was taking daily but is now taking as needed.  He very rarely uses Aleve.  He does not smoke.  Typically having a bowel movement daily or every other day.  Denies any lower abdominal pain or rectal bleeding.  Previously had some left lower quadrant pain but this improved with Celebrex and may have been attributed to left hip.  Appetite good weight stable.  He stays active playing golf and hunting.  Wt Readings from Last 3 Encounters:  06/28/19 168 lb 1.6 oz (76.2 kg)  01/12/19 156 lb 9.6 oz (71 kg)  06/24/18 162 lb 12.8 oz (73.8 kg)     Last Colonoscopy: 2014-Findings:   Prep excellent. Normal mucosa of terminal ileum. Few small diverticula and sigmoid colon and one at splenic flexure. Small polyps ablated via cold biopsy from rectum. Small hemorrhoids below the dentate line. Path with rectal tubular adenoma Per Dr Devonne Doughty had single small adenoma removed. Next colonoscopy in 10 years"  Last Endoscopy: 2012-Impression: No evidence of erosive esophagitis. Distal esophageal stricture dilated by passing 54 [place] Maloney dilator. Mucosal changes at esophageal body are suggestive of eosinophilic  esophagitis.   Past Medical History:  Past Medical History:  Diagnosis Date  . Anxiety   . GERD (gastroesophageal reflux disease)   . Gout   . Headache(784.0)   . Hiatal hernia   . High cholesterol   . Hypertension   . Skin cancer     Problem List: Patient Active Problem List   Diagnosis Date Noted  . Primary insomnia 09/26/2016  . DJD (degenerative joint disease), cervical 09/26/2016  . 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 Abdominal pain, left lower quadrant 10/19/2012  . GERD (gastroesophageal reflux disease) 04/15/2012  . Gout 04/01/2011  . Hypertension 04/01/2011  . Elevated cholesterol with high triglycerides 04/01/2011    Past Surgical History: Past Surgical History:  Procedure Laterality Date  . APPENDECTOMY    . COLONOSCOPY N/A 10/23/2012   Procedure: COLONOSCOPY;  Surgeon: Rogene Houston, MD;  Location: [facility];  Service: Endoscopy;  Laterality: N/A;  235  . ESOPHAGOGASTRODUODENOSCOPY  04/17/2011   Procedure: ESOPHAGOGASTRODUODENOSCOPY (EGD);  Surgeon: Rogene Houston, MD;  Location: [facility];  Service: Endoscopy;  Laterality: N/A;  8:30  . HAND SURGERY  05/28/2016  . INGUINAL HERNIA REPAIR Left 02/17/2013   Procedure: HERNIA REPAIR INGUINAL ADULT;  Surgeon: Jamesetta So, MD;  Location: [facility];  Service: General;  Laterality: Left;  . INSERTION OF MESH Left 02/17/2013   Procedure: INSERTION OF MESH;  Surgeon: Jamesetta So, MD;  Location: [facility];  Service: General;  Laterality: Left;  . sinus sugery      Allergies: Allergies  Allergen Reactions  . Codeine Nausea Only      Home Medications:  Current Outpatient Medications:  .  allopurinol (ZYLOPRIM) 100 MG tablet, Take 1 tablet (100 mg total) by mouth daily., Disp: 90 tablet, Rfl: 0 .  celecoxib (CELEBREX) 200 MG capsule, Take 200 mg by mouth daily. PRN, Disp: , Rfl:  .  fluticasone (FLONASE) 50 MCG/ACT nasal spray, Place into both nostrils daily as needed for  allergies or rhinitis., Disp: , Rfl:  .  levocetirizine (XYZAL) 5 MG tablet, Take 5 mg by mouth daily., Disp: , Rfl:  .  lisinopril (PRINIVIL,ZESTRIL) 10 MG tablet, Take 10 mg by mouth daily., Disp: , Rfl:  .  methocarbamol (ROBAXIN) 750 MG tablet, Take 750 mg by mouth as needed for muscle spasms., Disp: , Rfl:  .  naproxen sodium (ALEVE) 220 MG tablet, Take 220 mg by mouth daily as needed., Disp: , Rfl:  .  pantoprazole (PROTONIX) 40 MG tablet, TAKE ONE TABLET BY MOUTH DAILY 30 MINUTES BEFORE BREAKFAST AND ONE TABLET 30 MINUTES BEFORE SUPPER, Disp: 60 tablet, Rfl: 11 .  zolpidem (AMBIEN) 10 MG tablet, Take 10 mg by mouth at bedtime. For sleep, Disp: , Rfl:  .  MITIGARE 0.6 MG CAPS, 0.6 mg daily as needed. , Disp: , Rfl: 0   Family History: family history includes Aneurysm in his father.    Social History:   reports that he has never smoked. He quit smokeless tobacco use about 7 years ago.  His smokeless tobacco use included chew. He reports current alcohol use of about 1.0 standard drinks of alcohol per week. He reports that he does not use drugs.   Review of Systems: Constitutional: Denies weight loss/weight gain  Eyes: No changes in vision. ENT: No oral lesions, sore throat.  GI: see HPI.  Heme/Lymph: No easy bruising.  CV: No chest pain.  GU: No hematuria.  Integumentary: No rashes.  Neuro: No headaches.  Psych: No depression/anxiety.  Endocrine: No heat/cold intolerance.  Allergic/Immunologic: No urticaria.  Resp: No cough, SOB.  Musculoskeletal: No joint swelling.    Physical Examination: BP 122/80 (BP Location: Right Arm, Patient Position: Sitting, Cuff Size: Large)   Pulse 61   Temp 98.1 F (36.7 C) (Temporal)   Ht 5\' 10"  (1.778 m)   Wt 168 lb 1.6 oz (76.2 kg)   BMI 24.12 kg/m  Gen: NAD, alert and oriented x 4 HEENT: PEERLA, EOMI, Neck: supple, no JVD Chest: CTA bilaterally, no wheezes, crackles, or other adventitious sounds CV: RRR, no m/g/c/r Abd: soft, NT, ND,  +BS in all four quadrants; no HSM, guarding, ridigity, or rebound tenderness Ext: no edema, well perfused with 2+ pulses, Skin: no rash or lesions noted on observed skin Lymph: no noted LAD  Data Reviewed: August 2020--CBC normal except MCV 101, CMP normal.     Assessment/Plan: Mr. Lari is a 69 y.o. male    Domani was seen today for follow-up.  Diagnoses and all orders for this visit:  Chronic GERD  Dysphagia, unspecified type  Personal history of colonic polyps    1. GERD - well controlled w/ dose of PPI. We did review decreasing his daily alcohol intake. He has occasional dysphagia but does not feel its significant enough to need egd, to notify me if worsens.    2. Hx colon polyps - small TA in 2014, per recall due in 10  years. Discussed w/ Dr Laural Golden, can do 7-10 year f/up for single small TA. I called pt after OV and left VM w/ updated recommendation, asked for return call.      F/up 1 year - sooner if needed   I personally performed the service, non-incident to. (WP)  Laurine Blazer, [facility]

## 2019-06-28 NOTE — Patient Instructions (Signed)
We are refilling your Protonix 40 mg, take once a day 30 min before breakfast-if you have any worsening symptoms of trouble swallowing please contact us sooner than 1 year.   I will discuss repeating your colonoscopy with Dr. Laural Golden and contact you with timeframe.

## 2019-07-05 DIAGNOSIS — M542 Cervicalgia: Secondary | ICD-10-CM | POA: Diagnosis not present

## 2019-07-06 NOTE — Progress Notes (Addendum)
Office Visit Note  Patient: Dennis Miles             Date of Birth: [birth year]           MRN: [id number]             PCP: Celene Squibb, MD Referring: Celene Squibb, MD Visit Date: 07/13/2019 Occupation: @GUAROCC @  Subjective:  Neck Pain (Left sideded neck and shoulder pain - finished prednisone - Dr. Nevada Crane) and Gout (Doing good)   History of Present Illness: Dennis Miles is a 69 y.o. male history of gouty arthropathy and degenerative disc disease.  He states 3 days ago he woke up with sudden pain in his cervical spine.  He states the pain was radiating into his left lower extremity.  He went to see his PCP about a week ago and was given prednisone taper.  He states he just finished prednisone taper.  He had minimal improvement with the prednisone taper.  He states he has been experiencing numbness in his left hand.  He has not had a gout flare in the last 3 years.  He has been taking allopurinol 100 mg p.o. daily.  He is also on Celebrex and Aleve on as needed basis for the left inguinal pain which she has from previous hernia repair surgery.  Activities of Daily Living:  Patient reports morning stiffness for 24 hours.   Patient Denies nocturnal pain.  Difficulty dressing/grooming: Denies Difficulty climbing stairs: Denies Difficulty getting out of chair: Denies Difficulty using hands for taps, buttons, cutlery, and/or writing: Denies  Review of Systems  Constitutional: Positive for fatigue. Negative for night sweats.  HENT: Negative for mouth sores, mouth dryness and nose dryness.   Eyes: Negative for redness and dryness.  Respiratory: Negative for shortness of breath and difficulty breathing.   Cardiovascular: Negative for chest pain, palpitations, hypertension, irregular heartbeat and swelling in legs/feet.  Gastrointestinal: Negative for constipation and diarrhea.  Endocrine: Negative for cold intolerance and increased urination.  Genitourinary: Negative for difficulty  urinating.  Musculoskeletal: Positive for arthralgias, joint pain, muscle weakness, morning stiffness and muscle tenderness. Negative for joint swelling, myalgias and myalgias.  Skin: Negative for color change, rash, hair loss, nodules/bumps, skin tightness, ulcers and sensitivity to sunlight.  Allergic/Immunologic: Negative for susceptible to infections.  Neurological: Positive for numbness. Negative for dizziness, fainting, memory loss, night sweats and weakness.  Hematological: Negative for bruising/bleeding tendency and swollen glands.  Psychiatric/Behavioral: Positive for sleep disturbance. Negative for depressed mood. The patient is not nervous/anxious.     PMFS History:  Patient Active Problem List   Diagnosis Date Noted  . Primary insomnia 09/26/2016  . DJD (degenerative joint disease), cervical 09/26/2016  . 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 Abdominal pain, left lower quadrant 10/19/2012  . GERD (gastroesophageal reflux disease) 04/15/2012  . Gout 04/01/2011  . Hypertension 04/01/2011  . Elevated cholesterol with high triglycerides 04/01/2011    Past Medical History:  Diagnosis Date  . Anxiety   . GERD (gastroesophageal reflux disease)   . Gout   . Headache(784.0)   . Hiatal hernia   . High cholesterol   . Hypertension   . Skin cancer     Family History  Problem Relation Age of Onset  . Aneurysm Father   . Colon cancer Neg Hx    Past Surgical History:  Procedure Laterality Date  . APPENDECTOMY    . COLONOSCOPY N/A 10/23/2012   Procedure: COLONOSCOPY;  Surgeon: Rogene Houston, MD;  [facility];  Service: Endoscopy;  Laterality: N/A;  235  . ESOPHAGOGASTRODUODENOSCOPY  04/17/2011   Procedure: ESOPHAGOGASTRODUODENOSCOPY (EGD);  Surgeon: Rogene Houston, MD;  Location: [facility];  Service: Endoscopy;  Laterality: N/A;  8:30  . HAND SURGERY  05/28/2016  . INGUINAL HERNIA REPAIR Left 02/17/2013   Procedure: HERNIA  REPAIR INGUINAL ADULT;  Surgeon: Jamesetta So, MD;  Location: [facility];  Service: General;  Laterality: Left;  . INSERTION OF MESH Left 02/17/2013   Procedure: INSERTION OF MESH;  Surgeon: Jamesetta So, MD;  Location: [facility];  Service: General;  Laterality: Left;  . sinus sugery    . SKIN CANCER EXCISION     Social History   Social History Narrative   Lives at home with daughter.  Retired.  Education 12th grade.  Children 2.      There is no immunization history on file for this patient.   Objective: Vital Signs: BP 122/79 (BP Location: Left Arm, Patient Position: Sitting, Cuff Size: Normal)   Pulse 69   Resp 16   Ht 5\' 9"  (1.753 m)   Wt 168 lb (76.2 kg)   BMI 24.81 kg/m    Physical Exam Vitals and nursing note reviewed.  Constitutional:      Appearance: He is well-developed.  HENT:     Head: Normocephalic and atraumatic.  Eyes:     Conjunctiva/sclera: Conjunctivae normal.     Pupils: Pupils are equal, round, and reactive to light.  Cardiovascular:     Rate and Rhythm: Normal rate and regular rhythm.     Heart sounds: Normal heart sounds.  Pulmonary:     Effort: Pulmonary effort is normal.     Breath sounds: Normal breath sounds.  Abdominal:     General: Bowel sounds are normal.     Palpations: Abdomen is soft.  Musculoskeletal:     Cervical back: Normal range of motion and neck supple.  Skin:    General: Skin is warm and dry.     Capillary Refill: Capillary refill takes less than 2 seconds.  Neurological:     Mental Status: He is alert and oriented to person, place, and time.  Psychiatric:        Behavior: Behavior normal.      Musculoskeletal Exam: Patient had good range of motion of her cervical spine although he had discomfort in his left arm on left lateral rotation of his C-spine.  Thoracic and lumbar spine with good range of motion.  He good range of motion of bilateral shoulder joints elbow joints wrist joint MCPs PIPs and DIPs.  Hip joints knee joints  ankles MTPs PIPs with good range of motion with no synovitis.  CDAI Exam: CDAI Score: -- Patient Global: --; Provider Global: -- Swollen: --; Tender: -- Joint Exam 07/13/2019   No joint exam has been documented for this visit   There is currently no information documented on the homunculus. Go to the Rheumatology activity and complete the homunculus joint exam.  Investigation: No additional findings.  Imaging: XR Cervical Spine 2 or 3 views  Result Date: 07/13/2019 No significant disc space narrowing was noted.  Facet joint arthropathy was noted. 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  XR Shoulder Left  Result Date: 07/13/2019 No glenohumeral or acromioclavicular joint space narrowing was noted.  No chondrocalcinosis was noted. Impression: Unremarkable x-ray of the shoulder joint.   Recent Labs: Lab Results  Component Value  Date   WBC 6.5 01/12/2019   HGB 14.1 01/12/2019   PLT 201 01/12/2019   NA 141 01/12/2019   K 4.9 01/12/2019   CL 103 01/12/2019   CO2 29 01/12/2019   GLUCOSE 84 01/12/2019   BUN 7 01/12/2019   CREATININE 1.07 01/12/2019   BILITOT 0.6 01/12/2019   ALKPHOS 71 09/26/2016   AST 24 01/12/2019   ALT 18 01/12/2019   PROT 6.8 01/12/2019   ALBUMIN 4.2 09/26/2016   CALCIUM 9.5 01/12/2019   GFRAA 84 01/12/2019    Speciality Comments: No specialty comments available.  Procedures:  No procedures performed Allergies: Codeine   Assessment / Plan:     Visit Diagnoses: Idiopathic chronic gout of multiple sites with tophus-patient is has not had any gout flares in a long time.  Although his uric acid is not in the desirable range.  Ideally I would prefer for him to have uric acid below 6.  We will check his labs today including CBC CMC and a uric acid.  My plan is to increase allopurinol to 200 mg p.o. daily.  We will check CMP with GFR and uric acid in 1 month.  Medication management - Allopurinol 100 mg po daily and mitigare 0.6 mg po as  needed.    Primary osteoarthritis of both feet-he is not having much discomfort currently.  Neck pain-he has been experiencing neck pain with left-sided radiculopathy.  We will obtain x-ray of his cervical spine today.  The x-ray showed only mild facet joint arthropathy.  No significant disc space narrowing was noted.  I also reviewed his previous x-rays and the MRI scan of his cervical spine which was consistent with facet joint arthropathy.  I offered referral to Dr. Ernestina Patches but he would like to hold off.  Have given him a handout on exercises.  I offered physical therapy but he declined.  I have advised him to contact me in case his symptoms persist and then I will refer him to Dr. Ernestina Patches for evaluation and possible injection.  Chronic left shoulder pain-patient is also having some discomfort in his left shoulder joint.  I believe it is coming from his cervical spine.  He had good range of motion of the shoulder joint.  X-ray obtained today was unremarkable.  DDD (degenerative disc disease), cervical-he has seen Dr. Carloyn Manner in the past.  Primary insomnia-improved  History of hyperlipidemia  History of gastroesophageal reflux (GERD)  History of hypertension-his blood pressure is under control.  Orders: Orders Placed This Encounter  Procedures  . XR Cervical Spine 2 or 3 views  . XR Shoulder Left  . CBC with Differential/Platelet  . COMPLETE METABOLIC PANEL WITH GFR  . Uric acid  . COMPLETE METABOLIC PANEL WITH GFR  . Uric acid   Meds ordered this encounter  Medications  . allopurinol (ZYLOPRIM) 100 MG tablet    Sig: Take 2 tablets (200 mg total) by mouth daily.    Dispense:  180 tablet    Refill:  0    Face-to-face time spent with patient was 30 minutes. Greater than 50% of time was spent in counseling and coordination of care.  Follow-Up Instructions: Return in about 6 months (around 01/10/2020) for DDD, gout.   Bo Merino, MD  Note - This record has been created using  Editor, commissioning.  Chart creation errors have been sought, but may not always  have been located. Such creation errors do not reflect on  the standard of medical care.

## 2019-07-13 ENCOUNTER — Encounter: Payer: Self-pay | Admitting: Rheumatology

## 2019-07-13 ENCOUNTER — Ambulatory Visit (INDEPENDENT_AMBULATORY_CARE_PROVIDER_SITE_OTHER): Payer: Medicare Other | Admitting: Rheumatology

## 2019-07-13 ENCOUNTER — Ambulatory Visit (INDEPENDENT_AMBULATORY_CARE_PROVIDER_SITE_OTHER): Payer: Medicare Other

## 2019-07-13 ENCOUNTER — Other Ambulatory Visit: Payer: Self-pay

## 2019-07-13 VITALS — BP 122/79 | HR 69 | Resp 16 | Ht 69.0 in | Wt 168.0 lb

## 2019-07-13 DIAGNOSIS — M19071 Primary osteoarthritis, right ankle and foot: Secondary | ICD-10-CM | POA: Diagnosis not present

## 2019-07-13 DIAGNOSIS — M1A09X1 Idiopathic chronic gout, multiple sites, with tophus (tophi): Secondary | ICD-10-CM | POA: Diagnosis not present

## 2019-07-13 DIAGNOSIS — Z79899 Other long term (current) drug therapy: Secondary | ICD-10-CM | POA: Diagnosis not present

## 2019-07-13 DIAGNOSIS — M19072 Primary osteoarthritis, left ankle and foot: Secondary | ICD-10-CM | POA: Diagnosis not present

## 2019-07-13 DIAGNOSIS — Z8679 Personal history of other diseases of the circulatory system: Secondary | ICD-10-CM | POA: Diagnosis not present

## 2019-07-13 DIAGNOSIS — M25512 Pain in left shoulder: Secondary | ICD-10-CM | POA: Diagnosis not present

## 2019-07-13 DIAGNOSIS — Z8719 Personal history of other diseases of the digestive system: Secondary | ICD-10-CM | POA: Diagnosis not present

## 2019-07-13 DIAGNOSIS — Z8639 Personal history of other endocrine, nutritional and metabolic disease: Secondary | ICD-10-CM

## 2019-07-13 DIAGNOSIS — M542 Cervicalgia: Secondary | ICD-10-CM | POA: Diagnosis not present

## 2019-07-13 DIAGNOSIS — M5030 Other cervical disc degeneration, unspecified cervical region: Secondary | ICD-10-CM | POA: Diagnosis not present

## 2019-07-13 DIAGNOSIS — F5101 Primary insomnia: Secondary | ICD-10-CM | POA: Diagnosis not present

## 2019-07-13 DIAGNOSIS — G8929 Other chronic pain: Secondary | ICD-10-CM

## 2019-07-13 MED ORDER — ALLOPURINOL 100 MG PO TABS: 200.0000 mg | ORAL_TABLET | Freq: Every day | ORAL | 0 refills | Status: DC

## 2019-07-13 NOTE — Addendum Note (Signed)
Addended by: Shona Needles on: 07/13/2019 10:57 AM   Modules accepted: Orders

## 2019-07-13 NOTE — Addendum Note (Signed)
Addended by: Bo Merino on: 07/13/2019 10:55 AM   Modules accepted: Orders

## 2019-07-13 NOTE — Addendum Note (Signed)
Addended by: Bo Merino on: 07/13/2019 11:16 AM   Modules accepted: Orders

## 2019-07-13 NOTE — Patient Instructions (Addendum)

## 2019-07-14 LAB — CBC WITH DIFFERENTIAL/PLATELET
Absolute Monocytes: 807 cells/uL (ref 200–950)
Basophils Absolute: 37 cells/uL (ref 0–200)
Basophils Relative: 0.5 %
Eosinophils Absolute: 133 cells/uL (ref 15–500)
Eosinophils Relative: 1.8 %
HCT: 40.1 % (ref 38.5–50.0)
Hemoglobin: 13.6 g/dL (ref 13.2–17.1)
Lymphs Abs: 1872 cells/uL (ref 850–3900)
MCH: 33.5 pg — ABNORMAL HIGH (ref 27.0–33.0)
MCHC: 33.9 g/dL (ref 32.0–36.0)
MCV: 98.8 fL (ref 80.0–100.0)
MPV: 11.1 fL (ref 7.5–12.5)
Monocytes Relative: 10.9 %
Neutro Abs: 4551 cells/uL (ref 1500–7800)
Neutrophils Relative %: 61.5 %
Platelets: 214 10*3/uL (ref 140–400)
RBC: 4.06 10*6/uL — ABNORMAL LOW (ref 4.20–5.80)
RDW: 11.6 % (ref 11.0–15.0)
Total Lymphocyte: 25.3 %
WBC: 7.4 10*3/uL (ref 3.8–10.8)

## 2019-07-14 LAB — COMPLETE METABOLIC PANEL WITH GFR
AG Ratio: 1.7 (calc) (ref 1.0–2.5)
ALT: 15 U/L (ref 9–46)
AST: 17 U/L (ref 10–35)
Albumin: 4.2 g/dL (ref 3.6–5.1)
Alkaline phosphatase (APISO): 55 U/L (ref 35–144)
BUN: 13 mg/dL (ref 7–25)
CO2: 30 mmol/L (ref 20–32)
Calcium: 9 mg/dL (ref 8.6–10.3)
Chloride: 100 mmol/L (ref 98–110)
Creat: 1.05 mg/dL (ref 0.70–1.25)
GFR, Est African American: 85 mL/min/{1.73_m2} (ref >=60)
GFR, Est Non African American: 74 mL/min/{1.73_m2} (ref >=60)
Globulin: 2.5 g/dL (calc) (ref 1.9–3.7)
Glucose, Bld: 94 mg/dL (ref 65–99)
Potassium: 4.1 mmol/L (ref 3.5–5.3)
Sodium: 138 mmol/L (ref 135–146)
Total Bilirubin: 0.5 mg/dL (ref 0.2–1.2)
Total Protein: 6.7 g/dL (ref 6.1–8.1)

## 2019-07-14 LAB — URIC ACID: Uric Acid, Serum: 6 mg/dL (ref 4.0–8.0)

## 2019-07-14 NOTE — Progress Notes (Signed)
CBC is normal.CMP is normal.  Uric acid is 6.0.  We want uric acid to be below 6.  Patient was advised to increase allopurinol to 100 mg, 2 tablets daily.  We will plan to repeat CMP with GFR and uric acid in 1 month.

## 2019-08-26 DIAGNOSIS — Z23 Encounter for immunization: Secondary | ICD-10-CM | POA: Diagnosis not present

## 2019-09-18 DIAGNOSIS — Z23 Encounter for immunization: Secondary | ICD-10-CM | POA: Diagnosis not present

## 2019-09-29 DIAGNOSIS — M1000 Idiopathic gout, unspecified site: Secondary | ICD-10-CM | POA: Diagnosis not present

## 2019-09-29 DIAGNOSIS — G5601 Carpal tunnel syndrome, right upper limb: Secondary | ICD-10-CM | POA: Diagnosis not present

## 2019-09-29 DIAGNOSIS — I10 Essential (primary) hypertension: Secondary | ICD-10-CM | POA: Diagnosis not present

## 2019-09-29 DIAGNOSIS — E785 Hyperlipidemia, unspecified: Secondary | ICD-10-CM | POA: Diagnosis not present

## 2019-09-29 DIAGNOSIS — M25552 Pain in left hip: Secondary | ICD-10-CM | POA: Diagnosis not present

## 2019-09-29 DIAGNOSIS — G5600 Carpal tunnel syndrome, unspecified upper limb: Secondary | ICD-10-CM | POA: Diagnosis not present

## 2019-09-29 DIAGNOSIS — E790 Hyperuricemia without signs of inflammatory arthritis and tophaceous disease: Secondary | ICD-10-CM | POA: Diagnosis not present

## 2019-09-29 DIAGNOSIS — E782 Mixed hyperlipidemia: Secondary | ICD-10-CM | POA: Diagnosis not present

## 2019-09-29 DIAGNOSIS — K219 Gastro-esophageal reflux disease without esophagitis: Secondary | ICD-10-CM | POA: Diagnosis not present

## 2019-09-29 DIAGNOSIS — M542 Cervicalgia: Secondary | ICD-10-CM | POA: Diagnosis not present

## 2019-09-29 DIAGNOSIS — F5101 Primary insomnia: Secondary | ICD-10-CM | POA: Diagnosis not present

## 2019-09-29 DIAGNOSIS — G4700 Insomnia, unspecified: Secondary | ICD-10-CM | POA: Diagnosis not present

## 2019-10-04 DIAGNOSIS — G4700 Insomnia, unspecified: Secondary | ICD-10-CM | POA: Diagnosis not present

## 2019-10-04 DIAGNOSIS — K219 Gastro-esophageal reflux disease without esophagitis: Secondary | ICD-10-CM | POA: Diagnosis not present

## 2019-10-04 DIAGNOSIS — E790 Hyperuricemia without signs of inflammatory arthritis and tophaceous disease: Secondary | ICD-10-CM | POA: Diagnosis not present

## 2019-10-04 DIAGNOSIS — I10 Essential (primary) hypertension: Secondary | ICD-10-CM | POA: Diagnosis not present

## 2019-10-04 DIAGNOSIS — G5601 Carpal tunnel syndrome, right upper limb: Secondary | ICD-10-CM | POA: Diagnosis not present

## 2019-10-04 DIAGNOSIS — M542 Cervicalgia: Secondary | ICD-10-CM | POA: Diagnosis not present

## 2019-10-04 DIAGNOSIS — E782 Mixed hyperlipidemia: Secondary | ICD-10-CM | POA: Diagnosis not present

## 2019-10-28 ENCOUNTER — Telehealth: Payer: Self-pay | Admitting: Rheumatology

## 2019-10-28 ENCOUNTER — Other Ambulatory Visit: Payer: Self-pay

## 2019-10-28 DIAGNOSIS — D225 Melanocytic nevi of trunk: Secondary | ICD-10-CM | POA: Diagnosis not present

## 2019-10-28 DIAGNOSIS — Z85828 Personal history of other malignant neoplasm of skin: Secondary | ICD-10-CM | POA: Diagnosis not present

## 2019-10-28 DIAGNOSIS — L578 Other skin changes due to chronic exposure to nonionizing radiation: Secondary | ICD-10-CM | POA: Diagnosis not present

## 2019-10-28 DIAGNOSIS — L821 Other seborrheic keratosis: Secondary | ICD-10-CM | POA: Diagnosis not present

## 2019-10-28 DIAGNOSIS — Z79899 Other long term (current) drug therapy: Secondary | ICD-10-CM | POA: Diagnosis not present

## 2019-10-28 DIAGNOSIS — L814 Other melanin hyperpigmentation: Secondary | ICD-10-CM | POA: Diagnosis not present

## 2019-10-28 DIAGNOSIS — L570 Actinic keratosis: Secondary | ICD-10-CM | POA: Diagnosis not present

## 2019-10-28 DIAGNOSIS — D2271 Melanocytic nevi of right lower limb, including hip: Secondary | ICD-10-CM | POA: Diagnosis not present

## 2019-10-28 NOTE — Telephone Encounter (Signed)
FYI: Please leave a detailed message on voicemail in reference to lab results, if patient does not answer.

## 2019-10-29 LAB — COMPLETE METABOLIC PANEL WITH GFR
AG Ratio: 1.8 (calc) (ref 1.0–2.5)
ALT: 20 U/L (ref 9–46)
AST: 29 U/L (ref 10–35)
Albumin: 4.1 g/dL (ref 3.6–5.1)
Alkaline phosphatase (APISO): 69 U/L (ref 35–144)
BUN: 10 mg/dL (ref 7–25)
CO2: 27 mmol/L (ref 20–32)
Calcium: 8.6 mg/dL (ref 8.6–10.3)
Chloride: 101 mmol/L (ref 98–110)
Creat: 1.05 mg/dL (ref 0.70–1.25)
GFR, Est African American: 85 mL/min/{1.73_m2} (ref >=60)
GFR, Est Non African American: 74 mL/min/{1.73_m2} (ref >=60)
Globulin: 2.3 g/dL (calc) (ref 1.9–3.7)
Glucose, Bld: 96 mg/dL (ref 65–99)
Potassium: 4.6 mmol/L (ref 3.5–5.3)
Sodium: 136 mmol/L (ref 135–146)
Total Bilirubin: 0.6 mg/dL (ref 0.2–1.2)
Total Protein: 6.4 g/dL (ref 6.1–8.1)

## 2019-10-29 LAB — CBC WITH DIFFERENTIAL/PLATELET
Absolute Monocytes: 802 cells/uL (ref 200–950)
Basophils Absolute: 43 cells/uL (ref 0–200)
Basophils Relative: 0.6 %
Eosinophils Absolute: 241 cells/uL (ref 15–500)
Eosinophils Relative: 3.4 %
HCT: 39.5 % (ref 38.5–50.0)
Hemoglobin: 13.5 g/dL (ref 13.2–17.1)
Lymphs Abs: 1775 cells/uL (ref 850–3900)
MCH: 34.7 pg — ABNORMAL HIGH (ref 27.0–33.0)
MCHC: 34.2 g/dL (ref 32.0–36.0)
MCV: 101.5 fL — ABNORMAL HIGH (ref 80.0–100.0)
MPV: 12 fL (ref 7.5–12.5)
Monocytes Relative: 11.3 %
Neutro Abs: 4239 cells/uL (ref 1500–7800)
Neutrophils Relative %: 59.7 %
Platelets: 232 10*3/uL (ref 140–400)
RBC: 3.89 10*6/uL — ABNORMAL LOW (ref 4.20–5.80)
RDW: 12.2 % (ref 11.0–15.0)
Total Lymphocyte: 25 %
WBC: 7.1 10*3/uL (ref 3.8–10.8)

## 2019-10-29 LAB — TEST AUTHORIZATION

## 2019-10-29 LAB — B12 AND FOLATE PANEL
Folate: 11.3 ng/mL
Vitamin B-12: 264 pg/mL (ref 200–1100)

## 2019-10-29 LAB — URIC ACID: Uric Acid, Serum: 4.9 mg/dL (ref 4.0–8.0)

## 2019-10-29 NOTE — Telephone Encounter (Signed)
Patient advised of lab results

## 2019-10-31 NOTE — Progress Notes (Signed)
B12 and folate are within normal limits.

## 2019-11-02 DIAGNOSIS — J069 Acute upper respiratory infection, unspecified: Secondary | ICD-10-CM | POA: Diagnosis not present

## 2019-11-24 ENCOUNTER — Other Ambulatory Visit: Payer: Self-pay

## 2019-11-24 ENCOUNTER — Emergency Department (HOSPITAL_COMMUNITY)
Admission: EM | Admit: 2019-11-24 | Discharge: 2019-11-24 | Disposition: A | Discharge status: Home / Self Care | Payer: Medicare Other | Attending: Emergency Medicine | Admitting: Emergency Medicine

## 2019-11-24 ENCOUNTER — Emergency Department (HOSPITAL_COMMUNITY): Payer: Medicare Other

## 2019-11-24 ENCOUNTER — Encounter (HOSPITAL_COMMUNITY): Payer: Self-pay

## 2019-11-24 DIAGNOSIS — Y999 Unspecified external cause status: Secondary | ICD-10-CM | POA: Diagnosis not present

## 2019-11-24 DIAGNOSIS — S060X1A Concussion with loss of consciousness of 30 minutes or less, initial encounter: Secondary | ICD-10-CM

## 2019-11-24 DIAGNOSIS — W19XXXA Unspecified fall, initial encounter: Secondary | ICD-10-CM

## 2019-11-24 DIAGNOSIS — Y9289 Other specified places as the place of occurrence of the external cause: Secondary | ICD-10-CM | POA: Diagnosis not present

## 2019-11-24 DIAGNOSIS — S61212A Laceration without foreign body of right middle finger without damage to nail, initial encounter: Secondary | ICD-10-CM | POA: Diagnosis not present

## 2019-11-24 DIAGNOSIS — Z79899 Other long term (current) drug therapy: Secondary | ICD-10-CM | POA: Diagnosis not present

## 2019-11-24 DIAGNOSIS — S0081XA Abrasion of other part of head, initial encounter: Secondary | ICD-10-CM | POA: Diagnosis not present

## 2019-11-24 DIAGNOSIS — I10 Essential (primary) hypertension: Secondary | ICD-10-CM | POA: Diagnosis not present

## 2019-11-24 DIAGNOSIS — W1789XA Other fall from one level to another, initial encounter: Secondary | ICD-10-CM | POA: Insufficient documentation

## 2019-11-24 DIAGNOSIS — Y9389 Activity, other specified: Secondary | ICD-10-CM | POA: Diagnosis not present

## 2019-11-24 DIAGNOSIS — Z85828 Personal history of other malignant neoplasm of skin: Secondary | ICD-10-CM | POA: Insufficient documentation

## 2019-11-24 DIAGNOSIS — T148XXA Other injury of unspecified body region, initial encounter: Secondary | ICD-10-CM

## 2019-11-24 DIAGNOSIS — Z87891 Personal history of nicotine dependence: Secondary | ICD-10-CM | POA: Diagnosis not present

## 2019-11-24 DIAGNOSIS — S2242XA Multiple fractures of ribs, left side, initial encounter for closed fracture: Secondary | ICD-10-CM

## 2019-11-24 DIAGNOSIS — S299XXA Unspecified injury of thorax, initial encounter: Secondary | ICD-10-CM | POA: Diagnosis present

## 2019-11-24 LAB — BASIC METABOLIC PANEL
Anion gap: 15 (ref 5–15)
BUN: 9 mg/dL (ref 8–23)
CO2: 23 mmol/L (ref 22–32)
Calcium: 8.7 mg/dL — ABNORMAL LOW (ref 8.9–10.3)
Chloride: 100 mmol/L (ref 98–111)
Creatinine, Ser: 1.1 mg/dL (ref 0.61–1.24)
GFR calc Af Amer: >60 mL/min (ref >60)
GFR calc non Af Amer: >60 mL/min (ref >60)
Glucose, Bld: 124 mg/dL — ABNORMAL HIGH (ref 70–99)
Potassium: 3.2 mmol/L — ABNORMAL LOW (ref 3.5–5.1)
Sodium: 138 mmol/L (ref 135–145)

## 2019-11-24 LAB — CBC
HCT: 41 % (ref 39.0–52.0)
Hemoglobin: 13.7 g/dL (ref 13.0–17.0)
MCH: 33.7 pg (ref 26.0–34.0)
MCHC: 33.4 g/dL (ref 30.0–36.0)
MCV: 100.7 fL — ABNORMAL HIGH (ref 80.0–100.0)
Platelets: 217 10*3/uL (ref 150–400)
RBC: 4.07 MIL/uL — ABNORMAL LOW (ref 4.22–5.81)
RDW: 11.8 % (ref 11.5–15.5)
WBC: 7.2 10*3/uL (ref 4.0–10.5)
nRBC: 0 % (ref 0.0–0.2)

## 2019-11-24 MED ORDER — IOHEXOL 300 MG/ML  SOLN
75.0000 mL | Freq: Once | INTRAMUSCULAR | Status: AC | PRN
  Administered 2019-11-24: 75 mL via INTRAVENOUS

## 2019-11-24 MED ORDER — HYDROCODONE-ACETAMINOPHEN 5-325 MG PO TABS: 1.0000 | ORAL_TABLET | Freq: Four times a day (QID) | ORAL | 0 refills | Status: DC | PRN

## 2019-11-24 MED ORDER — LIDOCAINE HCL (PF) 1 % IJ SOLN
5.0000 mL | Freq: Once | INTRAMUSCULAR | Status: DC
  Filled 2019-11-24: qty 30

## 2019-11-24 NOTE — ED Provider Notes (Signed)
I was asked to suture the laceration on patients right long volar proximal phalanx by Dr. Alvino Chapel. This was my only involvement with this patient.  LACERATION REPAIR Performed by: Evalee Jefferson Authorized by: Evalee Jefferson Consent: Verbal consent obtained. Risks and benefits: risks, benefits and alternatives were discussed Consent given by: patient Patient identity confirmed: provided demographic data Prepped and Draped in normal sterile fashion Wound explored  Laceration Location: right long finger, volar, proximal phalanx through subc layer.  No muscle or tendon visualized.   Laceration Length: 2cm  No Foreign Bodies seen or palpated  Anesthesia: digital block  Local anesthetic: lidocaine 2% without epinephrine  Anesthetic total: 3 ml  Irrigation method: syringe Amount of cleaning: copious after betadine scrub  Skin closure: ethilon 4-0  Number of sutures: 7  Technique: simple interupted  Patient tolerance: Patient tolerated the procedure well with no immediate complications.    Evalee Jefferson, PA-C 11/24/19 1501    Davonna Belling, MD 11/24/19 [phone]

## 2019-11-24 NOTE — ED Triage Notes (Addendum)
Pt reports fell approx 8 feet from scaffolding.  Reports the scaffolding broke.  Pt has abrasion to r side of face and nose.  Laceration to base of r middle finger, and abrasions to knees.  Pt says he lost consciousness.   Pt also says he feels like he pulled muscles in his chest.   Notified EDP.

## 2019-11-24 NOTE — ED Notes (Signed)
X-ray at bedside

## 2019-11-24 NOTE — ED Provider Notes (Signed)
[facility] EMERGENCY DEPARTMENT Provider Note   CSN: [id number] Arrival date & time: 11/24/19  1024     History Chief Complaint  Patient presents with  . Fall    Dennis Miles is a 69 y.o. male.  HPI Patient presents after fall.  Was on a 6 or 8 foot scaffolding putting up a soft when something broke and he fell.  Ground onto some siding with his right hand to stop from falling but did fall, striking his face.  Was knocked unconscious.  Complaining some mild pain in his face.  Pain in his left chest left wrist and right hand.  Not on anticoagulation.  Does have some chronic pain in his left wrist.  No neck pain.  No numbness or weakness.    Past Medical History:  Diagnosis Date  . Anxiety   . GERD (gastroesophageal reflux disease)   . Gout   . Headache(784.0)   . Hiatal hernia   . High cholesterol   . Hypertension   . Skin cancer     Patient Active Problem List   Diagnosis Date Noted  . Primary insomnia 09/26/2016  . DJD (degenerative joint disease), cervical 09/26/2016  . 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 Abdominal pain, left lower quadrant 10/19/2012  . GERD (gastroesophageal reflux disease) 04/15/2012  . Gout 04/01/2011  . Hypertension 04/01/2011  . Elevated cholesterol with high triglycerides 04/01/2011    Past Surgical History:  Procedure Laterality Date  . APPENDECTOMY    . COLONOSCOPY N/A 10/23/2012   Procedure: COLONOSCOPY;  Surgeon: Rogene Houston, MD;  Location: [facility];  Service: Endoscopy;  Laterality: N/A;  235  . ESOPHAGOGASTRODUODENOSCOPY  04/17/2011   Procedure: ESOPHAGOGASTRODUODENOSCOPY (EGD);  Surgeon: Rogene Houston, MD;  Location: [facility];  Service: Endoscopy;  Laterality: N/A;  8:30  . HAND SURGERY  05/28/2016  . INGUINAL HERNIA REPAIR Left 02/17/2013   Procedure: HERNIA REPAIR INGUINAL ADULT;  Surgeon: Jamesetta So, MD;  Location: [facility] ORS;  Service: General;  Laterality: Left;  . INSERTION  OF MESH Left 02/17/2013   Procedure: INSERTION OF MESH;  Surgeon: Jamesetta So, MD;  Location: [facility] ORS;  Service: General;  Laterality: Left;  . sinus sugery    . SKIN CANCER EXCISION         Family History  Problem Relation Age of Onset  . Aneurysm Father   . Colon cancer Neg Hx     Social History   Tobacco Use  . Smoking status: Never Smoker  . Smokeless tobacco: Former Systems developer    Types: Secondary school teacher  . Vaping Use: Never used  Substance Use Topics  . Alcohol use: Yes    Alcohol/week: 1.0 standard drink    Types: 1 Cans of beer per week    Comment: occ  . Drug use: Never    Home Medications Prior to Admission medications   Medication Sig Start Date End Date Taking? Authorizing Provider  allopurinol (ZYLOPRIM) 100 MG tablet Take 2 tablets (200 mg total) by mouth daily. 07/13/19   Bo Merino, MD  celecoxib (CELEBREX) 200 MG capsule Take 200 mg by mouth daily. PRN    [provider]  fluticasone (FLONASE) 50 MCG/ACT nasal spray Place into both nostrils daily as needed for allergies or rhinitis.    [provider]  HYDROcodone-acetaminophen (NORCO/VICODIN) 5-325 MG tablet Take 1-2 tablets by mouth every 6 (six) hours as needed. 11/24/19   Davonna Belling,  MD  levocetirizine (XYZAL) 5 MG tablet Take 5 mg by mouth daily. 07/20/18   [provider]  lisinopril (PRINIVIL,ZESTRIL) 10 MG tablet Take 10 mg by mouth daily.    [provider]  methocarbamol (ROBAXIN) 750 MG tablet Take 750 mg by mouth as needed for muscle spasms.    [provider]  MITIGARE 0.6 MG CAPS 0.6 mg daily as needed.  10/18/17   [provider]  naproxen sodium (ALEVE) 220 MG tablet Take 220 mg by mouth daily as needed.    [provider]  pantoprazole (PROTONIX) 40 MG tablet Take 1 tablet (40 mg total) by mouth daily. Take 30 minutes before meals 06/28/19   Laurine Blazer A, PA-C  zolpidem (AMBIEN) 10 MG tablet Take 10 mg by mouth at  bedtime. For sleep    [provider]    Allergies    Codeine  Review of Systems   Review of Systems  Constitutional: Negative for appetite change and fever.  HENT: Negative for congestion.   Respiratory: Negative for shortness of breath.   Cardiovascular: Positive for chest pain.  Gastrointestinal: Negative for abdominal distention.  Genitourinary: Negative for flank pain.  Musculoskeletal: Negative for gait problem and neck pain.       Left wrist and right hand pain.  Skin: Positive for wound.  Neurological: Negative for weakness.  Psychiatric/Behavioral: Negative for confusion.    Physical Exam Updated Vital Signs BP 133/85   Pulse 82   Temp 97.8 F (36.6 C) (Oral)   Resp 14   Ht 5\' 9"  (1.753 m)   Wt 74.8 kg   SpO2 99%   BMI 24.37 kg/m   Physical Exam Vitals reviewed.  HENT:     Head:     Comments: Abrasion to right cheek.  No underlying bony tenderness.  Eye movements intact.  Face symmetric.  Mild dried blood to right nare.  Teeth well aligned.  No jaw tenderness. Eyes:     Extraocular Movements: Extraocular movements intact.  Cardiovascular:     Rate and Rhythm: Regular rhythm.  Pulmonary:     Comments: Tenderness to left anterior chest without crepitance or deformity. Chest:     Chest wall: Tenderness present.  Abdominal:     Tenderness: There is no abdominal tenderness.  Musculoskeletal:     Cervical back: Neck supple.     Comments: Mild tenderness over left wrist.  Good range of motion.  Abrasion over palm of hand.  Sensation grossly intact.  Does have laceration over proximal phalanx of the third finger.  Lacerations on the palmar aspect.  Sensation intact distally.  Does have some flexion but not able to make a full fist and that the finger remains mildly extended.  Skin:    General: Skin is warm.     Capillary Refill: Capillary refill takes less than 2 seconds.  Neurological:     Mental Status: He is alert and oriented to person, place, and  time.     ED Results / Procedures / Treatments   Labs (all labs ordered are listed, but only abnormal results are displayed) Labs Reviewed  BASIC METABOLIC PANEL - Abnormal; Notable for the following components:      Result Value   Potassium 3.2 (*)    Glucose, Bld 124 (*)    Calcium 8.7 (*)    All other components within normal limits  CBC - Abnormal; Notable for the following components:   RBC 4.07 (*)    MCV 100.7 (*)  All other components within normal limits    EKG EKG Interpretation  Date/Time:  Wednesday November 24 2019 10:59:35 EDT Ventricular Rate:  85 PR Interval:    QRS Duration: 107 QT Interval:  394 QTC Calculation: 469 R Axis:   66 Text Interpretation: Sinus rhythm Borderline low voltage, extremity leads Baseline wander in lead(s) I V5 Confirmed by Davonna Belling [phone]) on 11/24/2019 11:03:41 AM   Radiology DG Wrist Complete Right  Result Date: 11/24/2019 CLINICAL DATA:  Right wrist pain after fall. EXAM: RIGHT WRIST - COMPLETE 3+ VIEW COMPARISON:  Right hand x-rays dated September 26, 2016. FINDINGS: No acute fracture or dislocation. Widening of the scapholunate interval. Mild distal radioulnar joint and lunocapitate osteoarthritis. Bone mineralization is normal. Soft tissues are unremarkable. IMPRESSION: 1. Widening of the scapholunate interval, consistent with scapholunate ligament injury. No fracture. Electronically Signed   By: Titus Dubin M.D.   On: 11/24/2019 11:53   CT Head Wo Contrast  Result Date: 11/24/2019 CLINICAL DATA:  Facial trauma, fall 8 feet from scaffolding. Abrasion to RIGHT-side of face and nose. EXAM: CT HEAD WITHOUT CONTRAST CT MAXILLOFACIAL WITHOUT CONTRAST TECHNIQUE: Multidetector CT imaging of the head and maxillofacial structures were performed using the standard protocol without intravenous contrast. Multiplanar CT image reconstructions of the maxillofacial structures were also generated. COMPARISON:  September 19, 2014 FINDINGS: CT  HEAD FINDINGS Brain: No evidence of acute infarction, hemorrhage, hydrocephalus, extra-axial collection or mass lesion/mass effect. Signs of generalized atrophy. Vascular: No hyperdense vessel or unexpected calcification. Skull: Normal. Negative for fracture or focal lesion. Other: Stranding about the RIGHT face better seen on facial CT. CT MAXILLOFACIAL FINDINGS Osseous: No fracture or mandibular dislocation. No destructive process. Orbits: Negative. No traumatic or inflammatory finding. 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 No acute findings related to the sinuses. Soft tissues: Hematoma along the RIGHT face. Small area beneath the RIGHT orbit. IMPRESSION: 1. No acute intracranial abnormality. 2. Hematoma along the RIGHT face. Small area beneath the RIGHT orbit. 3. No evidence for acute facial fracture. 4. 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 Electronically Signed   By: Zetta Bills M.D.   On: 11/24/2019 12:33   CT Chest W Contrast  Result Date: 11/24/2019 CLINICAL DATA:  Chest pain after fall from scaffolding EXAM: CT CHEST WITH CONTRAST TECHNIQUE: Multidetector CT imaging of the chest was performed during intravenous contrast administration. CONTRAST:  36mL OMNIPAQUE IOHEXOL 300 MG/ML  SOLN COMPARISON:  Same day chest x-ray FINDINGS: Cardiovascular: No significant vascular findings. Normal heart size. No pericardial effusion. Thoracic aorta is normal in course and caliber. Normal 3 vessel arch. Minimal thoracic aortic atherosclerotic calcification. Coronary artery calcifications are noted. Mediastinum/Nodes: No mediastinal hematoma or fluid collection. No enlarged mediastinal, hilar, or axillary lymph nodes. Thyroid gland, trachea, and esophagus demonstrate no significant findings. Lungs/Pleura: Lungs are clear. No pleural effusion  or pneumothorax. Upper Abdomen: No acute abnormality. Musculoskeletal: Acute minimally displaced fractures of the anterior aspects of the left third, fourth, and fifth ribs. No right-sided rib fracture. Thoracic vertebral bodies maintain normal height without evidence of fracture. Sternum intact. Minimal induration within the subcutaneous fat of the upper chest wall. No chest wall hematoma. IMPRESSION: 1. Acute minimally displaced fractures of the anterior aspects of the left third, fourth, and fifth ribs. No pneumothorax. 2. No acute cardiopulmonary findings. 3. Aortic and coronary artery atherosclerosis.  (ICD10-I70.0). Electronically Signed  By: Davina Poke D.O.   On: 11/24/2019 12:24   DG Chest Portable 1 View  Result Date: 11/24/2019 CLINICAL DATA:  Left-sided chest pain after fall. EXAM: PORTABLE CHEST 1 VIEW COMPARISON:  Chest x-ray dated January 03, 2005. FINDINGS: The heart size and mediastinal contours are within normal limits. Both lungs are clear. The visualized skeletal structures are unremarkable. IMPRESSION: No active disease. Electronically Signed   By: Titus Dubin M.D.   On: 11/24/2019 11:50   DG Hand Complete Right  Result Date: 11/24/2019 CLINICAL DATA:  Right hand pain after fall. EXAM: RIGHT HAND - COMPLETE 3+ VIEW COMPARISON:  Right hand x-rays dated September 26, 2016. FINDINGS: No acute fracture or dislocation. Unchanged mild third MCP joint osteoarthritis. Mild degenerative marginal spurring of the first MCP joint and first IP joint. Bone mineralization is normal. Soft tissues are unremarkable. IMPRESSION: 1. No acute osseous abnormality. Electronically Signed   By: Titus Dubin M.D.   On: 11/24/2019 11:56   CT Maxillofacial Wo Contrast  Result Date: 11/24/2019 CLINICAL DATA:  Facial trauma, fall 8 feet from scaffolding. Abrasion to RIGHT-side of face and nose. EXAM: CT HEAD WITHOUT CONTRAST CT MAXILLOFACIAL WITHOUT CONTRAST TECHNIQUE: Multidetector CT imaging of the head  and maxillofacial structures were performed using the standard protocol without intravenous contrast. Multiplanar CT image reconstructions of the maxillofacial structures were also generated. COMPARISON:  September 19, 2014 FINDINGS: CT HEAD FINDINGS Brain: No evidence of acute infarction, hemorrhage, hydrocephalus, extra-axial collection or mass lesion/mass effect. Signs of generalized atrophy. Vascular: No hyperdense vessel or unexpected calcification. Skull: Normal. Negative for fracture or focal lesion. Other: Stranding about the RIGHT face better seen on facial CT. CT MAXILLOFACIAL FINDINGS Osseous: No fracture or mandibular dislocation. No destructive process. Orbits: Negative. No traumatic or inflammatory finding. 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 No acute findings related to the sinuses. Soft tissues: Hematoma along the RIGHT face. Small area beneath the RIGHT orbit. IMPRESSION: 1. No acute intracranial abnormality. 2. Hematoma along the RIGHT face. Small area beneath the RIGHT orbit. 3. No evidence for acute facial fracture. 4. 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 Electronically Signed   By: Zetta Bills M.D.   On: 11/24/2019 12:33    Procedures Procedures (including critical care time)  Medications Ordered in ED Medications  lidocaine (PF) (XYLOCAINE) 1 % injection 5 mL (has no administration in time range)  iohexol (OMNIPAQUE) 300 MG/ML solution 75 mL (75 mLs Intravenous Contrast Given 11/24/19 1143)    ED Course  I have reviewed the triage vital signs and the nursing notes.  Pertinent labs & imaging results that were available during my care of the patient were reviewed by me and considered in my medical decision making (see chart for details).    Patient presents after fall.  Fell off scaffolding.  Laceration  on finger closed.  Unsure if there is tendon laceration below.  Somewhat decreased movement.  Will have follow-up with hand surgery, with whom I discussed the patient.  Follow-up in 2 days.  Also rib fractures on left.  Pain control and incentive spirometer given.  Also facial abrasion but does not appear to anything that needs suturing.  Generalized wound care.  Discharge home with outpatient follow-up.  Head CT reassuring.  Did have concussion but does not need to be directly monitor tonight or woken up tonight. Final Clinical  Impression(s) / ED Diagnoses Final diagnoses:  Laceration of right middle finger without foreign body without damage to nail, initial encounter  Closed fracture of multiple ribs of left side, initial encounter  Concussion with loss of consciousness of 30 minutes or less, initial encounter  Abrasion    Rx / DC Orders ED Discharge Orders         Ordered    HYDROcodone-acetaminophen (NORCO/VICODIN) 5-325 MG tablet  Every 6 hours PRN     Discontinue  Reprint     11/24/19 1551           Davonna Belling, MD 11/24/19 1607

## 2019-12-23 DIAGNOSIS — S61222D Laceration with foreign body of right middle finger without damage to nail, subsequent encounter: Secondary | ICD-10-CM | POA: Diagnosis not present

## 2019-12-28 NOTE — Progress Notes (Signed)
Office Visit Note  Patient: Dennis Miles             Date of Birth: Oct 15, 1950           MRN: [id number]             PCP: Celene Squibb, MD Referring: Celene Squibb, MD Visit Date: 01/11/2020 Occupation: @GUAROCC @  Subjective:  Medication Management   History of Present Illness: Dennis Miles is a 69 y.o. male with history of gouty arthropathy, osteoarthritis and degenerative disc disease.  He denies having any gout flare since the last visit.  He has been taking allopurinol 2 tablets a day.  He has not used colchicine in the last 3 years.  He states he fell in June and fractured his ribs, injured his right hand the middle finger.  It is gradually healing.  He does not have any rib cage pain currently.  Activities of Daily Living:  Patient reports morning stiffness for several  hours.   Patient Denies nocturnal pain.  Difficulty dressing/grooming: Denies Difficulty climbing stairs: Denies Difficulty getting out of chair: Denies Difficulty using hands for taps, buttons, cutlery, and/or writing: Reports  Review of Systems  Constitutional: Positive for fatigue.  HENT: Negative for mouth sores, mouth dryness and nose dryness.   Eyes: Negative for itching and dryness.  Respiratory: Negative for shortness of breath and difficulty breathing.   Cardiovascular: Negative for chest pain and palpitations.  Gastrointestinal: Negative for blood in stool, constipation and diarrhea.  Endocrine: Negative for increased urination.  Genitourinary: Negative for difficulty urinating.  Musculoskeletal: Positive for arthralgias, joint pain, morning stiffness and muscle tenderness. Negative for joint swelling, myalgias and myalgias.  Skin: Negative for color change, rash and redness.  Allergic/Immunologic: Negative for susceptible to infections.  Neurological: Negative for dizziness, numbness, headaches, memory loss and weakness.  Hematological: Negative for bruising/bleeding tendency.   Psychiatric/Behavioral: Negative for confusion.    PMFS History:  Patient Active Problem List   Diagnosis Date Noted  . Primary insomnia 09/26/2016  . DJD (degenerative joint disease), cervical 09/26/2016  . 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 Abdominal pain, left lower quadrant 10/19/2012  . GERD (gastroesophageal reflux disease) 04/15/2012  . Gout 04/01/2011  . Hypertension 04/01/2011  . Elevated cholesterol with high triglycerides 04/01/2011    Past Medical History:  Diagnosis Date  . Anxiety   . GERD (gastroesophageal reflux disease)   . Gout   . Headache(784.0)   . Hiatal hernia   . High cholesterol   . Hypertension   . Skin cancer     Family History  Problem Relation Age of Onset  . Aneurysm Father   . Colon cancer Neg Hx    Past Surgical History:  Procedure Laterality Date  . APPENDECTOMY    . COLONOSCOPY N/A 10/23/2012   Procedure: COLONOSCOPY;  Surgeon: Rogene Houston, MD;  [facility];  Service: Endoscopy;  Laterality: N/A;  235  . ESOPHAGOGASTRODUODENOSCOPY  04/17/2011   Procedure: ESOPHAGOGASTRODUODENOSCOPY (EGD);  Surgeon: Rogene Houston, MD;  Location[facility];  Service: Endoscopy;  Laterality: N/A;  8:30  . HAND SURGERY  05/28/2016  . INGUINAL HERNIA REPAIR Left 02/17/2013   Procedure: HERNIA REPAIR INGUINAL ADULT;  Surgeon: Jamesetta So, MD;  Location: [facility];  Service: General;  Laterality: Left;  . INSERTION OF MESH Left 02/17/2013   Procedure: INSERTION OF MESH;  Surgeon: Jamesetta So, MD;  Location: [facility];  Service: General;  Laterality: Left;  . sinus sugery    . SKIN CANCER EXCISION     Social History   Social History Narrative   Lives at home with daughter.  Retired.  Education 12th grade.  Children 2.     Immunization History  Administered Date(s) Administered  . PFIZER SARS-COV-2 Vaccination 08/02/2019, 08/23/2019     Objective: Vital Signs: BP 125/83 (BP Location: Left Arm,  Patient Position: Sitting, Cuff Size: Normal)   Pulse 70   Resp 16   Ht 5\' 9"  (1.753 m)   Wt 160 lb 3.2 oz (72.7 kg)   BMI 23.66 kg/m    Physical Exam Vitals and nursing note reviewed.  Constitutional:      Appearance: He is well-developed.  HENT:     Head: Normocephalic and atraumatic.  Eyes:     Conjunctiva/sclera: Conjunctivae normal.     Pupils: Pupils are equal, round, and reactive to light.  Cardiovascular:     Rate and Rhythm: Normal rate and regular rhythm.     Heart sounds: Normal heart sounds.  Pulmonary:     Effort: Pulmonary effort is normal.     Breath sounds: Normal breath sounds.  Abdominal:     General: Bowel sounds are normal.     Palpations: Abdomen is soft.  Musculoskeletal:     Cervical back: Normal range of motion and neck supple.  Skin:    General: Skin is warm and dry.     Capillary Refill: Capillary refill takes less than 2 seconds.  Neurological:     Mental Status: He is alert and oriented to person, place, and time.  Psychiatric:        Behavior: Behavior normal.      Musculoskeletal Exam: He has some limitation of range of motion of his cervical spine.  Shoulder joints, elbow joints, wrist joints with good range of motion.  He has incomplete flexion of his right middle finger due to the recent injury.  No synovitis was noted.  Hip joints, knee joints, ankles with good range of motion.  He has bilateral PIP and DIP thickening consistent with osteoarthritis.  CDAI Exam: CDAI Score: -- Patient Global: --; Provider Global: -- Swollen: --; Tender: -- Joint Exam 01/11/2020   No joint exam has been documented for this visit   There is currently no information documented on the homunculus. Go to the Rheumatology activity and complete the homunculus joint exam.  Investigation: No additional findings.  Imaging: No results found.  Recent Labs: Lab Results  Component Value Date   WBC 7.2 11/24/2019   HGB 13.7 11/24/2019   PLT 217 11/24/2019    NA 138 11/24/2019   K 3.2 (L) 11/24/2019   CL 100 11/24/2019   CO2 23 11/24/2019   GLUCOSE 124 (H) 11/24/2019   BUN 9 11/24/2019   CREATININE 1.10 11/24/2019   BILITOT 0.6 10/28/2019   ALKPHOS 71 09/26/2016   AST 29 10/28/2019   ALT 20 10/28/2019   PROT 6.4 10/28/2019   ALBUMIN 4.2 09/26/2016   CALCIUM 8.7 (L) 11/24/2019   GFRAA >60 11/24/2019    Speciality Comments: No specialty comments available.  Procedures:  No procedures performed Allergies: Codeine   Assessment / Plan:     Visit Diagnoses: Idiopathic chronic gout of multiple sites with tophus - uric acid: 10/28/2019 4.9 -patient reports not having any gout flares in the last 2 years.  He has been taking allopurinol on a regular basis and has been tolerating it well.  He is also monitoring his diet.  Plan: Uric acid in November.  Medication management - Allopurinol 200 mg po daily and mitigare 0.6 mg po as needed.   - Plan: CBC with Differential/Platelet, COMPLETE METABOLIC PANEL WITH GFR in November.  Chronic left shoulder pain-he has off-and-on discomfort.  He had good range of motion.  Primary osteoarthritis of both feet-proper fitting shoes were emphasized.  DDD (degenerative disc disease), cervical - he has seen Dr. Carloyn Manner in the past.  Primary insomnia  History of gastroesophageal reflux (GERD)  History of hypertension  History of hyperlipidemia   COVID-19-he is fully vaccinated.  I have advised him to continue to wear mask, practice social distancing and hand hygiene.  Also in case he gets COVID-19 infection he will be a candidate for antibody infusion based on his age.  Orders: Orders Placed This Encounter  Procedures  . CBC with Differential/Platelet  . COMPLETE METABOLIC PANEL WITH GFR  . Uric acid   No orders of the defined types were placed in this encounter.   .  Follow-Up Instructions: Return in about 6 months (around 07/13/2020) for Gout, Osteoarthritis.   Bo Merino, MD  Note -  This record has been created using Editor, commissioning.  Chart creation errors have been sought, but may not always  have been located. Such creation errors do not reflect on  the standard of medical care.

## 2020-01-11 ENCOUNTER — Ambulatory Visit (INDEPENDENT_AMBULATORY_CARE_PROVIDER_SITE_OTHER): Payer: Medicare Other | Admitting: Rheumatology

## 2020-01-11 ENCOUNTER — Encounter: Payer: Self-pay | Admitting: Rheumatology

## 2020-01-11 ENCOUNTER — Other Ambulatory Visit: Payer: Self-pay

## 2020-01-11 VITALS — BP 125/83 | HR 70 | Resp 16 | Ht 69.0 in | Wt 160.2 lb

## 2020-01-11 DIAGNOSIS — M19072 Primary osteoarthritis, left ankle and foot: Secondary | ICD-10-CM | POA: Diagnosis not present

## 2020-01-11 DIAGNOSIS — M1A09X1 Idiopathic chronic gout, multiple sites, with tophus (tophi): Secondary | ICD-10-CM

## 2020-01-11 DIAGNOSIS — M25512 Pain in left shoulder: Secondary | ICD-10-CM

## 2020-01-11 DIAGNOSIS — G8929 Other chronic pain: Secondary | ICD-10-CM | POA: Diagnosis not present

## 2020-01-11 DIAGNOSIS — Z8679 Personal history of other diseases of the circulatory system: Secondary | ICD-10-CM

## 2020-01-11 DIAGNOSIS — Z79899 Other long term (current) drug therapy: Secondary | ICD-10-CM | POA: Diagnosis not present

## 2020-01-11 DIAGNOSIS — F5101 Primary insomnia: Secondary | ICD-10-CM | POA: Diagnosis not present

## 2020-01-11 DIAGNOSIS — Z8639 Personal history of other endocrine, nutritional and metabolic disease: Secondary | ICD-10-CM | POA: Diagnosis not present

## 2020-01-11 DIAGNOSIS — M5030 Other cervical disc degeneration, unspecified cervical region: Secondary | ICD-10-CM

## 2020-01-11 DIAGNOSIS — Z8719 Personal history of other diseases of the digestive system: Secondary | ICD-10-CM

## 2020-01-11 DIAGNOSIS — M19071 Primary osteoarthritis, right ankle and foot: Secondary | ICD-10-CM

## 2020-01-11 NOTE — Patient Instructions (Signed)
CBC, CMP, uric acid is due in November.

## 2020-01-17 DIAGNOSIS — E790 Hyperuricemia without signs of inflammatory arthritis and tophaceous disease: Secondary | ICD-10-CM | POA: Diagnosis not present

## 2020-01-17 DIAGNOSIS — G4700 Insomnia, unspecified: Secondary | ICD-10-CM | POA: Diagnosis not present

## 2020-01-17 DIAGNOSIS — M542 Cervicalgia: Secondary | ICD-10-CM | POA: Diagnosis not present

## 2020-01-17 DIAGNOSIS — S060X0A Concussion without loss of consciousness, initial encounter: Secondary | ICD-10-CM | POA: Diagnosis not present

## 2020-01-17 DIAGNOSIS — W12XXXA Fall on and from scaffolding, initial encounter: Secondary | ICD-10-CM | POA: Diagnosis not present

## 2020-01-17 DIAGNOSIS — E785 Hyperlipidemia, unspecified: Secondary | ICD-10-CM | POA: Diagnosis not present

## 2020-01-17 DIAGNOSIS — I10 Essential (primary) hypertension: Secondary | ICD-10-CM | POA: Diagnosis not present

## 2020-01-17 DIAGNOSIS — Z6824 Body mass index (BMI) 24.0-24.9, adult: Secondary | ICD-10-CM | POA: Diagnosis not present

## 2020-01-17 DIAGNOSIS — K219 Gastro-esophageal reflux disease without esophagitis: Secondary | ICD-10-CM | POA: Diagnosis not present

## 2020-01-17 DIAGNOSIS — G5601 Carpal tunnel syndrome, right upper limb: Secondary | ICD-10-CM | POA: Diagnosis not present

## 2020-01-17 DIAGNOSIS — R110 Nausea: Secondary | ICD-10-CM | POA: Diagnosis not present

## 2020-01-17 DIAGNOSIS — Z8739 Personal history of other diseases of the musculoskeletal system and connective tissue: Secondary | ICD-10-CM | POA: Diagnosis not present

## 2020-01-19 DIAGNOSIS — I10 Essential (primary) hypertension: Secondary | ICD-10-CM | POA: Diagnosis not present

## 2020-01-19 DIAGNOSIS — G4700 Insomnia, unspecified: Secondary | ICD-10-CM | POA: Diagnosis not present

## 2020-01-19 DIAGNOSIS — M1A09X0 Idiopathic chronic gout, multiple sites, without tophus (tophi): Secondary | ICD-10-CM | POA: Diagnosis not present

## 2020-01-19 DIAGNOSIS — E782 Mixed hyperlipidemia: Secondary | ICD-10-CM | POA: Diagnosis not present

## 2020-01-19 DIAGNOSIS — Z0001 Encounter for general adult medical examination with abnormal findings: Secondary | ICD-10-CM | POA: Diagnosis not present

## 2020-01-19 DIAGNOSIS — E790 Hyperuricemia without signs of inflammatory arthritis and tophaceous disease: Secondary | ICD-10-CM | POA: Diagnosis not present

## 2020-01-19 DIAGNOSIS — K219 Gastro-esophageal reflux disease without esophagitis: Secondary | ICD-10-CM | POA: Diagnosis not present

## 2020-01-19 DIAGNOSIS — M542 Cervicalgia: Secondary | ICD-10-CM | POA: Diagnosis not present

## 2020-01-19 DIAGNOSIS — G5601 Carpal tunnel syndrome, right upper limb: Secondary | ICD-10-CM | POA: Diagnosis not present

## 2020-01-20 DIAGNOSIS — M79644 Pain in right finger(s): Secondary | ICD-10-CM | POA: Diagnosis not present

## 2020-01-20 DIAGNOSIS — S61222D Laceration with foreign body of right middle finger without damage to nail, subsequent encounter: Secondary | ICD-10-CM | POA: Diagnosis not present

## 2020-03-02 DIAGNOSIS — S61222D Laceration with foreign body of right middle finger without damage to nail, subsequent encounter: Secondary | ICD-10-CM | POA: Diagnosis not present

## 2020-04-14 DIAGNOSIS — J019 Acute sinusitis, unspecified: Secondary | ICD-10-CM | POA: Diagnosis not present

## 2020-04-14 DIAGNOSIS — J029 Acute pharyngitis, unspecified: Secondary | ICD-10-CM | POA: Diagnosis not present

## 2020-04-18 DIAGNOSIS — G4700 Insomnia, unspecified: Secondary | ICD-10-CM | POA: Diagnosis not present

## 2020-04-18 DIAGNOSIS — K219 Gastro-esophageal reflux disease without esophagitis: Secondary | ICD-10-CM | POA: Diagnosis not present

## 2020-04-18 DIAGNOSIS — S060X0A Concussion without loss of consciousness, initial encounter: Secondary | ICD-10-CM | POA: Diagnosis not present

## 2020-04-18 DIAGNOSIS — J0191 Acute recurrent sinusitis, unspecified: Secondary | ICD-10-CM | POA: Diagnosis not present

## 2020-04-18 DIAGNOSIS — M542 Cervicalgia: Secondary | ICD-10-CM | POA: Diagnosis not present

## 2020-04-18 DIAGNOSIS — E785 Hyperlipidemia, unspecified: Secondary | ICD-10-CM | POA: Diagnosis not present

## 2020-04-18 DIAGNOSIS — Z8739 Personal history of other diseases of the musculoskeletal system and connective tissue: Secondary | ICD-10-CM | POA: Diagnosis not present

## 2020-04-18 DIAGNOSIS — R110 Nausea: Secondary | ICD-10-CM | POA: Diagnosis not present

## 2020-04-18 DIAGNOSIS — E790 Hyperuricemia without signs of inflammatory arthritis and tophaceous disease: Secondary | ICD-10-CM | POA: Diagnosis not present

## 2020-04-18 DIAGNOSIS — J342 Deviated nasal septum: Secondary | ICD-10-CM | POA: Diagnosis not present

## 2020-04-18 DIAGNOSIS — I10 Essential (primary) hypertension: Secondary | ICD-10-CM | POA: Diagnosis not present

## 2020-04-18 DIAGNOSIS — G5601 Carpal tunnel syndrome, right upper limb: Secondary | ICD-10-CM | POA: Diagnosis not present

## 2020-04-18 DIAGNOSIS — J019 Acute sinusitis, unspecified: Secondary | ICD-10-CM | POA: Diagnosis not present

## 2020-04-18 DIAGNOSIS — Z6824 Body mass index (BMI) 24.0-24.9, adult: Secondary | ICD-10-CM | POA: Diagnosis not present

## 2020-04-18 DIAGNOSIS — J029 Acute pharyngitis, unspecified: Secondary | ICD-10-CM | POA: Diagnosis not present

## 2020-05-02 DIAGNOSIS — G5601 Carpal tunnel syndrome, right upper limb: Secondary | ICD-10-CM | POA: Diagnosis not present

## 2020-05-02 DIAGNOSIS — M542 Cervicalgia: Secondary | ICD-10-CM | POA: Diagnosis not present

## 2020-05-02 DIAGNOSIS — E782 Mixed hyperlipidemia: Secondary | ICD-10-CM | POA: Diagnosis not present

## 2020-05-02 DIAGNOSIS — G4700 Insomnia, unspecified: Secondary | ICD-10-CM | POA: Diagnosis not present

## 2020-05-02 DIAGNOSIS — E790 Hyperuricemia without signs of inflammatory arthritis and tophaceous disease: Secondary | ICD-10-CM | POA: Diagnosis not present

## 2020-05-02 DIAGNOSIS — K219 Gastro-esophageal reflux disease without esophagitis: Secondary | ICD-10-CM | POA: Diagnosis not present

## 2020-05-02 DIAGNOSIS — Z23 Encounter for immunization: Secondary | ICD-10-CM | POA: Diagnosis not present

## 2020-05-02 DIAGNOSIS — I10 Essential (primary) hypertension: Secondary | ICD-10-CM | POA: Diagnosis not present

## 2020-05-02 DIAGNOSIS — M1A09X0 Idiopathic chronic gout, multiple sites, without tophus (tophi): Secondary | ICD-10-CM | POA: Diagnosis not present

## 2020-05-11 ENCOUNTER — Ambulatory Visit (INDEPENDENT_AMBULATORY_CARE_PROVIDER_SITE_OTHER): Payer: Medicare Other | Admitting: Gastroenterology

## 2020-05-11 DIAGNOSIS — D485 Neoplasm of uncertain behavior of skin: Secondary | ICD-10-CM | POA: Diagnosis not present

## 2020-05-11 DIAGNOSIS — Z85828 Personal history of other malignant neoplasm of skin: Secondary | ICD-10-CM | POA: Diagnosis not present

## 2020-05-11 DIAGNOSIS — L570 Actinic keratosis: Secondary | ICD-10-CM | POA: Diagnosis not present

## 2020-05-11 DIAGNOSIS — L821 Other seborrheic keratosis: Secondary | ICD-10-CM | POA: Diagnosis not present

## 2020-05-11 DIAGNOSIS — L578 Other skin changes due to chronic exposure to nonionizing radiation: Secondary | ICD-10-CM | POA: Diagnosis not present

## 2020-05-11 DIAGNOSIS — L814 Other melanin hyperpigmentation: Secondary | ICD-10-CM | POA: Diagnosis not present

## 2020-05-11 DIAGNOSIS — C44329 Squamous cell carcinoma of skin of other parts of face: Secondary | ICD-10-CM | POA: Diagnosis not present

## 2020-05-11 DIAGNOSIS — D225 Melanocytic nevi of trunk: Secondary | ICD-10-CM | POA: Diagnosis not present

## 2020-05-11 DIAGNOSIS — D2271 Melanocytic nevi of right lower limb, including hip: Secondary | ICD-10-CM | POA: Diagnosis not present

## 2020-05-11 DIAGNOSIS — L820 Inflamed seborrheic keratosis: Secondary | ICD-10-CM | POA: Diagnosis not present

## 2020-06-22 DIAGNOSIS — Z23 Encounter for immunization: Secondary | ICD-10-CM | POA: Diagnosis not present

## 2020-06-27 NOTE — Progress Notes (Signed)
Office Visit Note  Patient: Dennis Miles             Date of Birth: 11-Aug-1950           MRN: [id number]             PCP: Celene Squibb, MD Referring: Celene Squibb, MD Visit Date: 07/11/2020 Occupation: @GUAROCC @  Subjective:  Other (Patient reports testing positive for COVID on 06/03/2020)   History of Present Illness: Dennis Miles is a 70 y.o. male with history of gout, osteoarthritis and degenerative disc disease.  He states he had a fall and June 2021 and injured his ribs.  He also had a laceration on his right hand.  He was taking muscle relaxers and anti-inflammatories for pain which is improved now.  He denies having any gout flares.  He has been taking allopurinol 100 mg p.o. daily.  He is did not have to take any colchicine in a long time.  He states that on New Year's Eve he developed Covid symptoms and was diagnosed with COVID-19 infection.  He had high fever and some sinus symptoms gradually the symptoms resolved.  Activities of Daily Living:  Patient reports morning stiffness for 0  minutes.   Patient Denies nocturnal pain.  Difficulty dressing/grooming: Denies Difficulty climbing stairs: Denies Difficulty getting out of chair: Denies Difficulty using hands for taps, buttons, cutlery, and/or writing: Denies  Review of Systems  Constitutional: Positive for fatigue.  HENT: Negative for mouth sores, mouth dryness and nose dryness.   Eyes: Negative for pain, itching and dryness.  Respiratory: Negative for shortness of breath and difficulty breathing.   Cardiovascular: Negative for chest pain and palpitations.  Gastrointestinal: Negative for blood in stool, constipation and diarrhea.  Endocrine: Negative for increased urination.  Genitourinary: Negative for difficulty urinating.  Musculoskeletal: Negative for arthralgias, joint pain, joint swelling, myalgias, morning stiffness, muscle tenderness and myalgias.  Skin: Negative for color change, rash and redness.   Allergic/Immunologic: Negative for susceptible to infections.  Neurological: Positive for numbness. Negative for dizziness, headaches, memory loss and weakness.  Hematological: Negative for bruising/bleeding tendency.  Psychiatric/Behavioral: Negative for confusion.    PMFS History:  Patient Active Problem List   Diagnosis Date Noted  . Primary insomnia 09/26/2016  . DJD (degenerative joint disease), cervical 09/26/2016  . 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 Abdominal pain, left lower quadrant 10/19/2012  . GERD (gastroesophageal reflux disease) 04/15/2012  . Gout 04/01/2011  . Hypertension 04/01/2011  . Elevated cholesterol with high triglycerides 04/01/2011    Past Medical History:  Diagnosis Date  . Anxiety   . GERD (gastroesophageal reflux disease)   . Gout   . Headache(784.0)   . Hiatal hernia   . High cholesterol   . Hypertension   . Skin cancer     Family History  Problem Relation Age of Onset  . Aneurysm Father   . Asthma Child   . Colon cancer Neg Hx    Past Surgical History:  Procedure Laterality Date  . APPENDECTOMY    . COLONOSCOPY N/A 10/23/2012   Procedure: COLONOSCOPY;  Surgeon: Rogene Houston, MD;  Location: [facility];  Service: Endoscopy;  Laterality: N/A;  235  . ESOPHAGOGASTRODUODENOSCOPY  04/17/2011   Procedure: ESOPHAGOGASTRODUODENOSCOPY (EGD);  Surgeon: Rogene Houston, MD;  Location: [facility];  Service: Endoscopy;  Laterality: N/A;  8:30  . HAND SURGERY  05/28/2016  . INGUINAL HERNIA REPAIR Left 02/17/2013  Procedure: HERNIA REPAIR INGUINAL ADULT;  Surgeon: Jamesetta So, MD;  Location: [facility];  Service: General;  Laterality: Left;  . INSERTION OF MESH Left 02/17/2013   Procedure: INSERTION OF MESH;  Surgeon: Jamesetta So, MD;  Location: [facility];  Service: General;  Laterality: Left;  . SINOSCOPY    . sinus sugery    . SKIN CANCER EXCISION     Social History   Social History Narrative   Lives at  home with daughter.  Retired.  Education 12th grade.  Children 2.     Immunization History  Administered Date(s) Administered  . PFIZER(Purple Top)SARS-COV-2 Vaccination 08/02/2019, 08/23/2019, 06/22/2020     Objective: Vital Signs: BP 127/85 (BP Location: Left Arm, Patient Position: Sitting, Cuff Size: Normal)   Pulse 62   Resp 17   Ht 5\' 9"  (1.753 m)   Wt 162 lb 6.4 oz (73.7 kg)   BMI 23.98 kg/m    Physical Exam Vitals and nursing note reviewed.  Constitutional:      Appearance: He is well-developed and well-nourished.  HENT:     Head: Normocephalic and atraumatic.  Eyes:     Extraocular Movements: EOM normal.     Conjunctiva/sclera: Conjunctivae normal.     Pupils: Pupils are equal, round, and reactive to light.  Cardiovascular:     Rate and Rhythm: Normal rate and regular rhythm.     Heart sounds: Normal heart sounds.  Pulmonary:     Effort: Pulmonary effort is normal.     Breath sounds: Normal breath sounds.  Abdominal:     General: Bowel sounds are normal.     Palpations: Abdomen is soft.  Musculoskeletal:     Cervical back: Normal range of motion and neck supple.  Skin:    General: Skin is warm and dry.     Capillary Refill: Capillary refill takes less than 2 seconds.  Neurological:     Mental Status: He is alert and oriented to person, place, and time.  Psychiatric:        Mood and Affect: Mood and affect normal.        Behavior: Behavior normal.      Musculoskeletal Exam: He had good range of motion of cervical spine.  Shoulder joints, elbow joints, wrist joints, MCPs PIPs and DIPs with good range of motion with no synovitis.  Hip joints, knee joints, ankles, MTPs and PIPs with good range of motion with no synovitis.  CDAI Exam: CDAI Score: -- Patient Global: --; Provider Global: -- Swollen: --; Tender: -- Joint Exam 07/11/2020   No joint exam has been documented for this visit   There is currently no information documented on the homunculus. Go to  the Rheumatology activity and complete the homunculus joint exam.  Investigation: No additional findings.  Imaging: No results found.  Recent Labs: Lab Results  Component Value Date   WBC 7.2 11/24/2019   HGB 13.7 11/24/2019   PLT 217 11/24/2019   NA 138 11/24/2019   K 3.2 (L) 11/24/2019   CL 100 11/24/2019   CO2 23 11/24/2019   GLUCOSE 124 (H) 11/24/2019   BUN 9 11/24/2019   CREATININE 1.10 11/24/2019   BILITOT 0.6 10/28/2019   ALKPHOS 71 09/26/2016   AST 29 10/28/2019   ALT 20 10/28/2019   PROT 6.4 10/28/2019   ALBUMIN 4.2 09/26/2016   CALCIUM 8.7 (L) 11/24/2019   GFRAA >60 11/24/2019    Speciality Comments: No specialty comments available.  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  He had no tophi on my examination.  He has been taking allopurinol 100 mg p.o. daily instead of 200 mg p.o. daily.  We will check his labs today.  He denies having any gout flare since last visit.  Medication management - Allopurinol 100 mg po daily and mitigare 0.6 mg po as needed.  He did not have to use Mitigare in a long time.  We will call him after the lab results to adjust the dose of allopurinol if needed.  Chronic left shoulder pain-secondary to the fall in June 2021.  Primary osteoarthritis of both feet-currently not having any discomfort.  DDD (degenerative disc disease), cervical - he has seen Dr. Carloyn Manner in the past.  Primary insomnia-improved.  History of hypertension-his blood pressure is normal today.  History of gastroesophageal reflux (GERD)  History of hyperlipidemia  COVID-19 virus infection-he had COVID-19 virus infection on June 03, 2020.  His symptoms lasted for few days and resolved.  He is fully vaccinated against COVID-19 and also received a booster.  Use of mask, social distancing and hand hygiene was  discussed.  Orders: No orders of the defined types were placed in this encounter.  No orders of the defined types were placed in this encounter.    Follow-Up Instructions: Return for Gout.   Bo Merino, MD  Note - This record has been created using Editor, commissioning.  Chart creation errors have been sought, but may not always  have been located. Such creation errors do not reflect on  the standard of medical care.

## 2020-06-28 ENCOUNTER — Ambulatory Visit (INDEPENDENT_AMBULATORY_CARE_PROVIDER_SITE_OTHER): Payer: Medicare Other | Admitting: Gastroenterology

## 2020-06-28 ENCOUNTER — Ambulatory Visit (INDEPENDENT_AMBULATORY_CARE_PROVIDER_SITE_OTHER): Payer: Medicare Other | Admitting: Allergy & Immunology

## 2020-06-28 ENCOUNTER — Other Ambulatory Visit: Payer: Self-pay

## 2020-06-28 ENCOUNTER — Encounter: Payer: Self-pay | Admitting: Allergy & Immunology

## 2020-06-28 VITALS — BP 128/80 | HR 68 | Temp 98.1°F | Resp 18 | Ht 70.08 in | Wt 163.4 lb

## 2020-06-28 DIAGNOSIS — J302 Other seasonal allergic rhinitis: Secondary | ICD-10-CM | POA: Diagnosis not present

## 2020-06-28 DIAGNOSIS — J3089 Other allergic rhinitis: Secondary | ICD-10-CM | POA: Diagnosis not present

## 2020-06-28 NOTE — Patient Instructions (Addendum)
1. Seasonal and perennial allergic rhinitis - Testing today showed: grasses, weeds, trees, indoor molds, outdoor molds, dust mites, cat, dog and cockroach - Copy of test results provided.  - Avoidance measures provided. - Start taking: budesonide nasal saline rinses twice daily (see recipe below) and Astelin (azelastine) 2 sprays per nostril 1-2 times daily as needed - You can use an extra dose of the antihistamine, if needed, for breakthrough symptoms.  - Consider nasal saline rinses 1-2 times daily to remove allergens from the nasal cavities as well as help with mucous clearance (this is especially helpful to do before the nasal sprays are given) - Consider allergy shots as a means of long-term control. - Allergy shots "re-train" and "reset" the immune system to ignore environmental allergens and decrease the resulting immune response to those allergens (sneezing, itchy watery eyes, runny nose, nasal congestion, etc).    - Allergy shots improve symptoms in 75-85% of patients.  - Talk to your insurance comp[any about any copays and let us know what you decide.  2. Return in about 4 weeks (around 07/26/2020).   Please inform us of any Emergency Department visits, hospitalizations, or changes in symptoms. Call us before going to the ED for breathing or allergy symptoms since we might be able to fit you in for a sick visit. Feel free to contact us anytime with any questions, problems, or concerns.  It was a pleasure to meet you today!  Websites that have reliable patient information: 1. American Academy of Asthma, Allergy, and Immunology: www.aaaai.org 2. Food Allergy Research and Education (FARE): foodallergy.org 3. Mothers of Asthmatics: http://www.asthmacommunitynetwork.org 4. American College of Allergy, Asthma, and Immunology: www.acaai.org   COVID-19 Vaccine Information can be found at: [email or web address] For questions related to  vaccine distribution or appointments, please email [email or web address] or call [phone].     "Like" [place] on Facebook and Instagram for our latest updates!       Make sure you are registered to vote! If you have moved or changed any of your contact information, you will need to get this updated before voting!  In some cases, you MAY be able to register to vote online: [email or web address]    You can buy saline nose drops at a pharmacy, or you can make your own saline solution:  1. Add 1 cup (240 mL) distilled water to a clean container. If you use tap water, boil it first to sterilize it, and then let it cool until it is lukewarm.  2. Add 0.5 tsp (2.5 g) salt to the water. 3. Add 0.5 tsp (2.5 g) baking soda.  Budesonide (Pulmicort) + Saline Irrigation/Rinse   Budesonide (Pulmicort) is an anti-inflammatory steroid medication used to decrease nasal and sinus inflammation. It is dispensed in liquid form in a vial. Although it is manufactured for use with a nebulizer, we intend for you to use it with the NeilMed Sinus Rinse bottle (preferred) or a Neti pot.    Instructions:  1) Make 240cc of saline in the NeilMed bottle using the salt packets or your own saline recipe (see recipe above).  2) Add the entire 2cc vial of liquid Budesonide (Pulmicort) to the rinse bottle and mix together.  3) While in the shower or over the sink, tilt your head forward to a comfortable level. Put the tip of the sinus rinse bottle in your nostril and aim it towards the crown or top of your head. Gently squeeze the bottle to flush out your nose. The fluid will circulate in  and out of your sinus cavities, coming back out from either nostril or through your mouth. Try not to swallow large quantities and spit it out instead.  4) Perform Budesonide (Pulmicort) + Saline irrigations 2 times daily.   Reducing Pollen Exposure  The American Academy of Allergy, Asthma and Immunology  suggests the following steps to reduce your exposure to pollen during allergy seasons.    1. Do not hang sheets or clothing out to dry; pollen may collect on these items. 2. Do not mow lawns or spend time around freshly cut grass; mowing stirs up pollen. 3. Keep windows closed at night.  Keep car windows closed while driving. 4. Minimize morning activities outdoors, a time when pollen counts are usually at their highest. 5. Stay indoors as much as possible when pollen counts or humidity is high and on windy days when pollen tends to remain in the air longer. 6. Use air conditioning when possible.  Many air conditioners have filters that trap the pollen spores. 7. Use a HEPA room air filter to remove pollen form the indoor air you breathe.  Control of Dust Mite Allergen    Dust mites play a major role in allergic asthma and rhinitis.  They occur in environments with high humidity wherever human skin is found.  Dust mites absorb humidity from the atmosphere (ie, they do not drink) and feed on organic matter (including shed human and animal skin).  Dust mites are a microscopic type of insect that you cannot see with the naked eye.  High levels of dust mites have been detected from mattresses, pillows, carpets, upholstered furniture, bed covers, clothes, soft toys and any woven material.  The principal allergen of the dust mite is found in its feces.  A gram of dust may contain 1,000 mites and 250,000 fecal particles.  Mite antigen is easily measured in the air during house cleaning activities.  Dust mites do not bite and do not cause harm to humans, other than by triggering allergies/asthma.    Ways to decrease your exposure to dust mites in your home:  1. Encase mattresses, box springs and pillows with a mite-impermeable barrier or cover   2. Wash sheets, blankets and drapes weekly in hot water (130 F) with detergent and dry them in a dryer on the hot setting.  3. Have the room cleaned frequently  with a vacuum cleaner and a damp dust-mop.  For carpeting or rugs, vacuuming with a vacuum cleaner equipped with a high-efficiency particulate air (HEPA) filter.  The dust mite allergic individual should not be in a room which is being cleaned and should wait 1 hour after cleaning before going into the room. 4. Do not sleep on upholstered furniture (eg, couches).   5. If possible removing carpeting, upholstered furniture and drapery from the home is ideal.  Horizontal blinds should be eliminated in the rooms where the person spends the most time (bedroom, study, television room).  Washable vinyl, roller-type shades are optimal. 6. Remove all non-washable stuffed toys from the bedroom.  Wash stuffed toys weekly like sheets and blankets above.   7. Reduce indoor humidity to less than 50%.  Inexpensive humidity monitors can be purchased at most hardware stores.  Do not use a humidifier as can make the problem worse and are not recommended.  Control of Mold Allergen   Mold and fungi can grow on a variety of surfaces provided certain temperature and moisture conditions exist.  Outdoor molds grow on plants, decaying vegetation  and soil.  The major outdoor mold, Alternaria and Cladosporium, are found in very high numbers during hot and dry conditions.  Generally, a late Summer - Fall peak is seen for common outdoor fungal spores.  Rain will temporarily lower outdoor mold spore count, but counts rise rapidly when the rainy period ends.  The most important indoor molds are Aspergillus and Penicillium.  Dark, humid and poorly ventilated basements are ideal sites for mold growth.  The next most common sites of mold growth are the bathroom and the kitchen.  Outdoor (Seasonal) Mold Control  Positive outdoor molds via skin testing: Alternaria, Cladosporium, Bipolaris (Helminthsporium), Drechslera (Curvalaria) and Mucor  1. Use air conditioning and keep windows closed 2. Avoid exposure to decaying  vegetation. 3. Avoid leaf raking. 4. Avoid grain handling. 5. Consider wearing a face mask if working in moldy areas.  6.   Indoor (Perennial) Mold Control   Positive indoor molds via skin testing: Aspergillus, Penicillium, Fusarium, Aureobasidium (Pullulara) and Rhizopus  1. Maintain humidity below 50%. 2. Clean washable surfaces with 5% bleach solution. 3. Remove sources e.g. contaminated carpets.     Control of Dust Mite Allergen    Dust mites play a major role in allergic asthma and rhinitis.  They occur in environments with high humidity wherever human skin is found.  Dust mites absorb humidity from the atmosphere (ie, they do not drink) and feed on organic matter (including shed human and animal skin).  Dust mites are a microscopic type of insect that you cannot see with the naked eye.  High levels of dust mites have been detected from mattresses, pillows, carpets, upholstered furniture, bed covers, clothes, soft toys and any woven material.  The principal allergen of the dust mite is found in its feces.  A gram of dust may contain 1,000 mites and 250,000 fecal particles.  Mite antigen is easily measured in the air during house cleaning activities.  Dust mites do not bite and do not cause harm to humans, other than by triggering allergies/asthma.    Ways to decrease your exposure to dust mites in your home:  8. Encase mattresses, box springs and pillows with a mite-impermeable barrier or cover   9. Wash sheets, blankets and drapes weekly in hot water (130 F) with detergent and dry them in a dryer on the hot setting.  10. Have the room cleaned frequently with a vacuum cleaner and a damp dust-mop.  For carpeting or rugs, vacuuming with a vacuum cleaner equipped with a high-efficiency particulate air (HEPA) filter.  The dust mite allergic individual should not be in a room which is being cleaned and should wait 1 hour after cleaning before going into the room. 11. Do not sleep on  upholstered furniture (eg, couches).   12. If possible removing carpeting, upholstered furniture and drapery from the home is ideal.  Horizontal blinds should be eliminated in the rooms where the person spends the most time (bedroom, study, television room).  Washable vinyl, roller-type shades are optimal. 13. Remove all non-washable stuffed toys from the bedroom.  Wash stuffed toys weekly like sheets and blankets above.   14. Reduce indoor humidity to less than 50%.  Inexpensive humidity monitors can be purchased at most hardware stores.  Do not use a humidifier as can make the problem worse and are not recommended.  Control of Dog or Cat Allergen  Avoidance is the best way to manage a dog or cat allergy. If you have a dog or cat and  are allergic to dog or cats, consider removing the dog or cat from the home. If you have a dog or cat but don't want to find it a new home, or if your family wants a pet even though someone in the household is allergic, here are some strategies that may help keep symptoms at bay:  1. Keep the pet out of your bedroom and restrict it to only a few rooms. Be advised that keeping the dog or cat in only one room will not limit the allergens to that room. 2. Don't pet, hug or kiss the dog or cat; if you do, wash your hands with soap and water. 3. High-efficiency particulate air (HEPA) cleaners run continuously in a bedroom or living room can reduce allergen levels over time. 4. Regular use of a high-efficiency vacuum cleaner or a central vacuum can reduce allergen levels. 5. Giving your dog or cat a bath at least once a week can reduce airborne allergen.  Control of Cockroach Allergen  Cockroach allergen has been identified as an important cause of acute attacks of asthma, especially in urban settings.  There are fifty-five species of cockroach that exist in the [place], however only three, the [place], [place] species produce allergen that can affect  patients with Asthma.  Allergens can be obtained from fecal particles, egg casings and secretions from cockroaches.    1. Remove food sources. 2. Reduce access to water. 3. Seal access and entry points. 4. Spray runways with 0.5-1% Diazinon or Chlorpyrifos 5. Blow boric acid power under stoves and refrigerator. 6. Place bait stations (hydramethylnon) at feeding sites.  Allergy Shots   Allergies are the result of a chain reaction that starts in the immune system. Your immune system controls how your body defends itself. For instance, if you have an allergy to pollen, your immune system identifies pollen as an invader or allergen. Your immune system overreacts by producing antibodies called Immunoglobulin E (IgE). These antibodies travel to cells that release chemicals, causing an allergic reaction.  The concept behind allergy immunotherapy, whether it is received in the form of shots or tablets, is that the immune system can be desensitized to specific allergens that trigger allergy symptoms. Although it requires time and patience, the payback can be long-term relief.  How Do Allergy Shots Work?  Allergy shots work much like a vaccine. Your body responds to injected amounts of a particular allergen given in increasing doses, eventually developing a resistance and tolerance to it. Allergy shots can lead to decreased, minimal or no allergy symptoms.  There generally are two phases: build-up and maintenance. Build-up often ranges from three to six months and involves receiving injections with increasing amounts of the allergens. The shots are typically given once or twice a week, though more rapid build-up schedules are sometimes used.  The maintenance phase begins when the most effective dose is reached. This dose is different for each person, depending on how allergic you are and your response to the build-up injections. Once the maintenance dose is reached, there are longer periods between  injections, typically two to four weeks.  Occasionally doctors give cortisone-type shots that can temporarily reduce allergy symptoms. These types of shots are different and should not be confused with allergy immunotherapy shots.  Who Can Be Treated with Allergy Shots?  Allergy shots may be a good treatment approach for people with allergic rhinitis (hay fever), allergic asthma, conjunctivitis (eye allergy) or stinging insect allergy.   Before deciding to  begin allergy shots, you should consider:  . The length of allergy season and the severity of your symptoms . Whether medications and/or changes to your environment can control your symptoms . Your desire to avoid long-term medication use . Time: allergy immunotherapy requires a major time commitment . Cost: may vary depending on your insurance coverage  Allergy shots for children age 62 and older are effective and often well tolerated. They might prevent the onset of new allergen sensitivities or the progression to asthma.  Allergy shots are not started on patients who are pregnant but can be continued on patients who become pregnant while receiving them. In some patients with other medical conditions or who take certain common medications, allergy shots may be of risk. It is important to mention other medications you talk to your allergist.   When Will I Feel Better?  Some may experience decreased allergy symptoms during the build-up phase. For others, it may take as long as 12 months on the maintenance dose. If there is no improvement after a year of maintenance, your allergist will discuss other treatment options with you.  If you aren't responding to allergy shots, it may be because there is not enough dose of the allergen in your vaccine or there are missing allergens that were not identified during your allergy testing. Other reasons could be that there are high levels of the allergen in your environment or major exposure to  non-allergic triggers like tobacco smoke.  What Is the Length of Treatment?  Once the maintenance dose is reached, allergy shots are generally continued for three to five years. The decision to stop should be discussed with your allergist at that time. Some people may experience a permanent reduction of allergy symptoms. Others may relapse and a longer course of allergy shots can be considered.  What Are the Possible Reactions?  The two types of adverse reactions that can occur with allergy shots are local and systemic. Common local reactions include very mild redness and swelling at the injection site, which can happen immediately or several hours after. A systemic reaction, which is less common, affects the entire body or a particular body system. They are usually mild and typically respond quickly to medications. Signs include increased allergy symptoms such as sneezing, a stuffy nose or hives.  Rarely, a serious systemic reaction called anaphylaxis can develop. Symptoms include swelling in the throat, wheezing, a feeling of tightness in the chest, nausea or dizziness. Most serious systemic reactions develop within 30 minutes of allergy shots. This is why it is strongly recommended you wait in your doctor's office for 30 minutes after your injections. Your allergist is trained to watch for reactions, and his or her staff is trained and equipped with the proper medications to identify and treat them.  Who Should Administer Allergy Shots?  The preferred location for receiving shots is your prescribing allergist's office. Injections can sometimes be given at another facility where the physician and staff are trained to recognize and treat reactions, and have received instructions by your prescribing allergist.

## 2020-06-28 NOTE — Progress Notes (Signed)
NEW PATIENT  Date of Service/Encounter:  06/28/20  Referring provider: Celene Squibb, MD   Assessment:   Seasonal and perennial allergic rhinitis (grasses, weeds, trees, indoor molds, outdoor molds, dust mites, cat, dog and cockroach)  Plan/Recommendations:   1. Seasonal and perennial allergic rhinitis - Testing today showed: grasses, weeds, trees, indoor molds, outdoor molds, dust mites, cat, dog and cockroach - Copy of test results provided.  - Avoidance measures provided. - Start taking: budesonide nasal saline rinses twice daily (see recipe below) and Astelin (azelastine) 2 sprays per nostril 1-2 times daily as needed - You can use an extra dose of the antihistamine, if needed, for breakthrough symptoms.  - Consider nasal saline rinses 1-2 times daily to remove allergens from the nasal cavities as well as help with mucous clearance (this is especially helpful to do before the nasal sprays are given) - Consider allergy shots as a means of long-term control. - Allergy shots "re-train" and "reset" the immune system to ignore environmental allergens and decrease the resulting immune response to those allergens (sneezing, itchy watery eyes, runny nose, nasal congestion, etc).    - Allergy shots improve symptoms in 75-85% of patients.  - Talk to your insurance comp[any about any copays and let us know what you decide.  2. Return in about 4 weeks (around 07/26/2020).    Subjective:   Dennis Miles is a 70 y.o. male presenting today for evaluation of  Chief Complaint  Patient presents with  . Allergy Testing    Dennis Miles has a history of the following: Patient Active Problem List   Diagnosis Date Noted  . Primary insomnia 09/26/2016  . DJD (degenerative joint disease), cervical 09/26/2016  . 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 Abdominal pain, left lower quadrant 10/19/2012  . GERD (gastroesophageal reflux disease) 04/15/2012  . Gout  04/01/2011  . Hypertension 04/01/2011  . Elevated cholesterol with high triglycerides 04/01/2011    History obtained from: chart review and patient.  Dennis Miles was referred by Celene Squibb, MD.     Dennis Miles is a 70 y.o. male presenting for an evaluation of environmental allergies.  He has a history of environmental allergies.  He reports that he has had these for years, but over the last year they have gotten worse.  His most annoying symptom is congestion which occurs on and off throughout the year.  Spring tends to be the worst time of year.  He does get better if he goes to the beach.  He grew up in [place].   He has never had environmental allergy testing done.  Every now and again, he will get a cocktail of steroids and antibiotics to treat sinus infections or bronchitis.  He gets this around twice per year.  Last time he got it was in November when he presented with a sore throat.  It does always help with symptoms.  He has been on a multitude of no sprays including Flonase Rhinocort.  He has been off antihistamines including Zyrtec, Claritin, and Allegra.  His favorite antihistamine was called Rhinaid, which she reports is no longer on the market. I looked this up and it is a Lactobacillus species.   He did have blood work that was positive to milk at a level of 4.2 or so.  However, he tolerates milk without any problem.  We reviewed signs and symptoms of anaphylaxis and he has no issues at all.  He does  eat yogurt and cheese. He otherwise tolerates of the major food allergens without adverse event.   He does have a history of pill dysphagia.  This has occurs when multivitamins.  He does have a history of food impaction, but this was around 8 years ago.  He had his esophagus stretched around 2014.  He has not had any issues with pain since that time.  He is not aware of any biopsies that were done.  Otherwise, there is no history of other atopic diseases, including asthma, food  allergies, drug allergies, stinging insect allergies, eczema, urticaria or contact dermatitis. There is no significant infectious history. Vaccinations are up to date.    Past Medical History: Patient Active Problem List   Diagnosis Date Noted  . Primary insomnia 09/26/2016  . DJD (degenerative joint disease), cervical 09/26/2016  . 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 Abdominal pain, left lower quadrant 10/19/2012  . GERD (gastroesophageal reflux disease) 04/15/2012  . Gout 04/01/2011  . Hypertension 04/01/2011  . Elevated cholesterol with high triglycerides 04/01/2011    Medication List:  Allergies as of 06/28/2020      Reactions   Codeine Nausea Only      Medication List       Accurate as of June 28, 2020 12:35 PM. If you have any questions, ask your nurse or doctor.        allopurinol 100 MG tablet Commonly known as: ZYLOPRIM Take 2 tablets (200 mg total) by mouth daily.   celecoxib 200 MG capsule Commonly known as: CELEBREX Take 200 mg by mouth daily. PRN   cyclobenzaprine 5 MG tablet Commonly known as: FLEXERIL cyclobenzaprine 5 mg tablet  TAKE 1 TABLET BY MOUTH THREE TIMES DAILY AS NEEDED FOR MUSCLE SPASM   ezetimibe 10 MG tablet Commonly known as: ZETIA ezetimibe 10 mg tablet  TAKE 1 TABLET BY MOUTH ONCE DAILY   fluticasone 50 MCG/ACT nasal spray Commonly known as: FLONASE Place 1 spray into both nostrils daily.   levocetirizine 5 MG tablet Commonly known as: XYZAL Take 5 mg by mouth daily.   lisinopril 10 MG tablet Commonly known as: ZESTRIL Take 10 mg by mouth daily.   methocarbamol 750 MG tablet Commonly known as: ROBAXIN Take 750 mg by mouth as needed for muscle spasms.   Colchicine 0.6 MG Caps Mitigare 0.6 mg capsule  Take 1 capsule every day by oral route.   Mitigare 0.6 MG Caps Generic drug: Colchicine 0.6 mg daily as needed.   naproxen sodium 220 MG tablet Commonly known as: ALEVE Take 220 mg by  mouth daily as needed.   pantoprazole 20 MG tablet Commonly known as: PROTONIX pantoprazole 20 mg tablet,delayed release  Take 2 tablets every day by oral route.   pantoprazole 40 MG tablet Commonly known as: PROTONIX Take 1 tablet (40 mg total) by mouth daily. Take 30 minutes before meals   zolpidem 10 MG tablet Commonly known as: AMBIEN Take 10 mg by mouth at bedtime. For sleep       Birth History: non-contributory  Developmental History: non-contributory  Past Surgical History: Past Surgical History:  Procedure Laterality Date  . APPENDECTOMY    . COLONOSCOPY N/A 10/23/2012   Procedure: COLONOSCOPY;  Surgeon: Rogene Houston, MD;  [facility];  Service: Endoscopy;  Laterality: N/A;  235  . ESOPHAGOGASTRODUODENOSCOPY  04/17/2011   Procedure: ESOPHAGOGASTRODUODENOSCOPY (EGD);  Surgeon: Rogene Houston, MD;  Location: [facility];  Service: Endoscopy;  Laterality:  N/A;  8:30  . HAND SURGERY  05/28/2016  . INGUINAL HERNIA REPAIR Left 02/17/2013   Procedure: HERNIA REPAIR INGUINAL ADULT;  Surgeon: Jamesetta So, MD;  Location: [facility];  Service: General;  Laterality: Left;  . INSERTION OF MESH Left 02/17/2013   Procedure: INSERTION OF MESH;  Surgeon: Jamesetta So, MD;  Location: [facility];  Service: General;  Laterality: Left;  . SINOSCOPY    . sinus sugery    . SKIN CANCER EXCISION       Family History: Family History  Problem Relation Age of Onset  . Aneurysm Father   . Asthma Child   . Colon cancer Neg Hx      Social History: Haralambos lives at home in a house that is 68 years old. There are hardwoods throughout the home. He has gas heating and central cooling. There is one dog inside of the home. There are dust mite coverings on the bedding. There is no tobacco exposure in the home or outside of the home. He has never been a smoker, but he did work in a [employer], trade [employer].    Review of Systems  Constitutional: Negative.  Negative for chills, fever,  malaise/fatigue and weight loss.  HENT: Positive for congestion. Negative for ear discharge, ear pain and sinus pain.   Eyes: Negative for pain, discharge and redness.  Respiratory: Negative for cough, sputum production, shortness of breath and wheezing.   Cardiovascular: Negative.  Negative for chest pain and palpitations.  Gastrointestinal: Negative for abdominal pain, constipation, diarrhea, heartburn, nausea and vomiting.  Skin: Negative.  Negative for itching and rash.  Neurological: Negative for dizziness and headaches.  Endo/Heme/Allergies: Positive for environmental allergies. Does not bruise/bleed easily.       Objective:   Blood pressure 128/80, pulse 68, temperature 98.1 F (36.7 C), temperature source Temporal, resp. rate 18, height 5' 10.08" (1.78 m), weight 163 lb 6.4 oz (74.1 kg), SpO2 100 %. Body mass index is 23.39 kg/m.   Physical Exam:   Physical Exam Constitutional:      Appearance: He is well-developed.     Comments: Very talkative male.  HENT:     Head: Normocephalic and atraumatic.     Right Ear: Tympanic membrane, ear canal and external ear normal. No drainage, swelling or tenderness. Tympanic membrane is not injected, scarred, erythematous, retracted or bulging.     Left Ear: Tympanic membrane, ear canal and external ear normal. No drainage, swelling or tenderness. Tympanic membrane is not injected, scarred, erythematous, retracted or bulging.     Nose: No nasal deformity, septal deviation, mucosal edema, rhinorrhea or epistaxis.     Right Turbinates: Enlarged and swollen.     Left Turbinates: Enlarged and swollen.     Right Sinus: No maxillary sinus tenderness or frontal sinus tenderness.     Left Sinus: No maxillary sinus tenderness or frontal sinus tenderness.     Comments: No polyps.    Mouth/Throat:     Mouth: Oropharynx is clear and moist. Mucous membranes are not pale and not dry.     Pharynx: Uvula midline.  Eyes:     General:        Right  eye: No discharge.        Left eye: No discharge.     Extraocular Movements: EOM normal.     Conjunctiva/sclera: Conjunctivae normal.     Right eye: Right conjunctiva is not injected. No chemosis.    Left eye: Left conjunctiva is not injected. No  chemosis.    Pupils: Pupils are equal, round, and reactive to light.  Cardiovascular:     Rate and Rhythm: Normal rate and regular rhythm.     Heart sounds: Normal heart sounds.  Pulmonary:     Effort: Pulmonary effort is normal. No tachypnea, accessory muscle usage or respiratory distress.     Breath sounds: Normal breath sounds. No wheezing, rhonchi or rales.     Comments: Moving air well in all lung fields. Chest:     Chest wall: No tenderness.  Abdominal:     Tenderness: There is no abdominal tenderness. There is no guarding or rebound.  Lymphadenopathy:     Head:     Right side of head: No submandibular, tonsillar or occipital adenopathy.     Left side of head: No submandibular, tonsillar or occipital adenopathy.     Cervical: No cervical adenopathy.  Skin:    Coloration: Skin is not pale.     Findings: No abrasion, erythema, petechiae or rash. Rash is not papular, urticarial or vesicular.  Neurological:     Mental Status: He is alert.  Psychiatric:        Mood and Affect: Mood and affect normal.      Diagnostic studies:   Allergy Studies:     Airborne Adult Perc - 06/28/20 0908    Time Antigen Placed [id number]    Allergen Manufacturer Waynette ButteryGreer    Location Back    Number of Test 59    Panel 1 Select    1. Control-Buffer 50% Glycerol Negative    2. Control-Histamine 1 mg/ml 2+    3. Albumin saline Negative    4. Bahia Negative    5. French Southern Territories[place]ermuda Negative    6. Johnson Negative    7. Kentucky Blue Negative    8. Meadow Fescue Negative    9. Perennial Rye Negative    10. Sweet Vernal 2+    11. Timothy 2+    12. Cocklebur Negative    13. Burweed Marshelder Negative    14. Ragweed, short Negative    15. Ragweed, Giant Negative     16. Plantain,  English Negative    17. Lamb's Quarters Negative    18. Sheep Sorrell Negative    19. Rough Pigweed Negative    20. Marsh Elder, Rough Negative    21. Mugwort, Common Negative    22. Ash mix Negative    23. Birch mix Negative    24. Beech American Negative    25. Box, Elder Negative    26. Cedar, red Negative    27. Cottonwood, Guinea-BissauEastern Negative    28. Elm mix Negative    29. Hickory 3+    30. Maple mix Negative    31. Oak, Guinea-Bissau[place]astern mix Negative    32. Pecan Pollen 2+    33. Pine mix Negative    34. Sycamore Eastern Negative    35. Walnut, Black Pollen 2+    36. Alternaria alternata Negative    37. Cladosporium Herbarum Negative    38. Aspergillus mix Negative    39. Penicillium mix Negative    40. Bipolaris sorokiniana (Helminthosporium) Negative    41. Drechslera spicifera (Curvularia) Negative    42. Mucor plumbeus Negative    43. Fusarium moniliforme Negative    44. Aureobasidium pullulans (pullulara) Negative    45. Rhizopus oryzae Negative    46. Botrytis cinera Negative    47. Epicoccum nigrum Negative    48. Phoma betae Negative  49. Candida Albicans Negative    50. Trichophyton mentagrophytes Negative    51. Mite, D Farinae  5,000 AU/ml 3+    52. Mite, D Pteronyssinus  5,000 AU/ml 3+    53. Cat Hair 10,000 BAU/ml 3+    54.  Dog Epithelia Negative    55. Mixed Feathers Negative    56. Horse Epithelia Negative    57. Cockroach, German Negative    58. Mouse Negative    59. Tobacco Leaf Negative          Intradermal - 06/28/20 0949    Time Antigen Placed 0940    Allergen Manufacturer Lavella Hammock    Location Arm    Number of Test 1100    Intradermal Select    Control Negative    Guatemala Negative    Johnson Negative    Ragweed mix Negative    Weed mix 1+    Mold 1 2+    Mold 2 3+    Mold 3 3+    Mold 4 3+    Dog 2+    Cockroach 2+           Allergy testing results were read and interpreted by myself, documented by clinical  staff.         Salvatore Marvel, MD [facility]

## 2020-06-29 ENCOUNTER — Telehealth: Payer: Self-pay | Admitting: Allergy & Immunology

## 2020-06-29 MED ORDER — AZELASTINE HCL 0.15 % NA SOLN
NASAL | 5 refills | Status: DC
Start: 2020-06-29 — End: 2021-05-14

## 2020-06-29 NOTE — Telephone Encounter (Signed)
Sent in Azelastine to pharmacy

## 2020-06-29 NOTE — Telephone Encounter (Signed)
Patient called and needs to have the astelin called into [facility] in [place] [phone]

## 2020-07-11 ENCOUNTER — Ambulatory Visit (INDEPENDENT_AMBULATORY_CARE_PROVIDER_SITE_OTHER): Payer: Medicare Other | Admitting: Rheumatology

## 2020-07-11 ENCOUNTER — Encounter: Payer: Self-pay | Admitting: Rheumatology

## 2020-07-11 ENCOUNTER — Other Ambulatory Visit: Payer: Self-pay

## 2020-07-11 VITALS — BP 127/85 | HR 62 | Resp 17 | Ht 69.0 in | Wt 162.4 lb

## 2020-07-11 DIAGNOSIS — F5101 Primary insomnia: Secondary | ICD-10-CM

## 2020-07-11 DIAGNOSIS — Z8679 Personal history of other diseases of the circulatory system: Secondary | ICD-10-CM | POA: Diagnosis not present

## 2020-07-11 DIAGNOSIS — M25512 Pain in left shoulder: Secondary | ICD-10-CM

## 2020-07-11 DIAGNOSIS — Z79899 Other long term (current) drug therapy: Secondary | ICD-10-CM | POA: Diagnosis not present

## 2020-07-11 DIAGNOSIS — M19072 Primary osteoarthritis, left ankle and foot: Secondary | ICD-10-CM

## 2020-07-11 DIAGNOSIS — Z8719 Personal history of other diseases of the digestive system: Secondary | ICD-10-CM | POA: Diagnosis not present

## 2020-07-11 DIAGNOSIS — M19071 Primary osteoarthritis, right ankle and foot: Secondary | ICD-10-CM

## 2020-07-11 DIAGNOSIS — M1A09X1 Idiopathic chronic gout, multiple sites, with tophus (tophi): Secondary | ICD-10-CM

## 2020-07-11 DIAGNOSIS — Z8639 Personal history of other endocrine, nutritional and metabolic disease: Secondary | ICD-10-CM

## 2020-07-11 DIAGNOSIS — M5030 Other cervical disc degeneration, unspecified cervical region: Secondary | ICD-10-CM

## 2020-07-11 DIAGNOSIS — U071 COVID-19: Secondary | ICD-10-CM

## 2020-07-11 DIAGNOSIS — G8929 Other chronic pain: Secondary | ICD-10-CM | POA: Diagnosis not present

## 2020-07-12 LAB — COMPLETE METABOLIC PANEL WITH GFR
AG Ratio: 1.6 (calc) (ref 1.0–2.5)
ALT: 17 U/L (ref 9–46)
AST: 22 U/L (ref 10–35)
Albumin: 4.1 g/dL (ref 3.6–5.1)
Alkaline phosphatase (APISO): 58 U/L (ref 35–144)
BUN: 8 mg/dL (ref 7–25)
CO2: 28 mmol/L (ref 20–32)
Calcium: 9.4 mg/dL (ref 8.6–10.3)
Chloride: 105 mmol/L (ref 98–110)
Creat: 1.16 mg/dL (ref 0.70–1.25)
GFR, Est African American: 75 mL/min/{1.73_m2} (ref >=60)
GFR, Est Non African American: 65 mL/min/{1.73_m2} (ref >=60)
Globulin: 2.6 g/dL (calc) (ref 1.9–3.7)
Glucose, Bld: 92 mg/dL (ref 65–99)
Potassium: 5.1 mmol/L (ref 3.5–5.3)
Sodium: 141 mmol/L (ref 135–146)
Total Bilirubin: 1.1 mg/dL (ref 0.2–1.2)
Total Protein: 6.7 g/dL (ref 6.1–8.1)

## 2020-07-12 LAB — CBC WITH DIFFERENTIAL/PLATELET
Absolute Monocytes: 648 cells/uL (ref 200–950)
Basophils Absolute: 41 cells/uL (ref 0–200)
Basophils Relative: 0.8 %
Eosinophils Absolute: 291 cells/uL (ref 15–500)
Eosinophils Relative: 5.7 %
HCT: 41.5 % (ref 38.5–50.0)
Hemoglobin: 14.3 g/dL (ref 13.2–17.1)
Lymphs Abs: 1729 cells/uL (ref 850–3900)
MCH: 33.8 pg — ABNORMAL HIGH (ref 27.0–33.0)
MCHC: 34.5 g/dL (ref 32.0–36.0)
MCV: 98.1 fL (ref 80.0–100.0)
MPV: 12.8 fL — ABNORMAL HIGH (ref 7.5–12.5)
Monocytes Relative: 12.7 %
Neutro Abs: 2392 cells/uL (ref 1500–7800)
Neutrophils Relative %: 46.9 %
Platelets: 178 10*3/uL (ref 140–400)
RBC: 4.23 10*6/uL (ref 4.20–5.80)
RDW: 11.8 % (ref 11.0–15.0)
Total Lymphocyte: 33.9 %
WBC: 5.1 10*3/uL (ref 3.8–10.8)

## 2020-07-12 LAB — URIC ACID: Uric Acid, Serum: 7.1 mg/dL (ref 4.0–8.0)

## 2020-07-12 NOTE — Progress Notes (Signed)
CBC and CMP are normal.  Uric acid is elevated at 7.1.  Patient has reduced his allopurinol dose himself from 200mg  to 100 mg p.o. daily.  Please advise him to increase allopurinol 100 mg tablet, 2 tablets daily.

## 2020-08-01 DIAGNOSIS — L570 Actinic keratosis: Secondary | ICD-10-CM | POA: Diagnosis not present

## 2020-08-01 DIAGNOSIS — D485 Neoplasm of uncertain behavior of skin: Secondary | ICD-10-CM | POA: Diagnosis not present

## 2020-08-01 DIAGNOSIS — D0439 Carcinoma in situ of skin of other parts of face: Secondary | ICD-10-CM | POA: Diagnosis not present

## 2020-08-01 DIAGNOSIS — C44329 Squamous cell carcinoma of skin of other parts of face: Secondary | ICD-10-CM | POA: Diagnosis not present

## 2020-08-02 DIAGNOSIS — M542 Cervicalgia: Secondary | ICD-10-CM | POA: Diagnosis not present

## 2020-08-02 DIAGNOSIS — G4700 Insomnia, unspecified: Secondary | ICD-10-CM | POA: Diagnosis not present

## 2020-08-02 DIAGNOSIS — R110 Nausea: Secondary | ICD-10-CM | POA: Diagnosis not present

## 2020-08-02 DIAGNOSIS — J019 Acute sinusitis, unspecified: Secondary | ICD-10-CM | POA: Diagnosis not present

## 2020-08-02 DIAGNOSIS — S060X0A Concussion without loss of consciousness, initial encounter: Secondary | ICD-10-CM | POA: Diagnosis not present

## 2020-08-02 DIAGNOSIS — Z6824 Body mass index (BMI) 24.0-24.9, adult: Secondary | ICD-10-CM | POA: Diagnosis not present

## 2020-08-02 DIAGNOSIS — E790 Hyperuricemia without signs of inflammatory arthritis and tophaceous disease: Secondary | ICD-10-CM | POA: Diagnosis not present

## 2020-08-02 DIAGNOSIS — G5601 Carpal tunnel syndrome, right upper limb: Secondary | ICD-10-CM | POA: Diagnosis not present

## 2020-08-02 DIAGNOSIS — K219 Gastro-esophageal reflux disease without esophagitis: Secondary | ICD-10-CM | POA: Diagnosis not present

## 2020-08-02 DIAGNOSIS — I10 Essential (primary) hypertension: Secondary | ICD-10-CM | POA: Diagnosis not present

## 2020-08-02 DIAGNOSIS — E785 Hyperlipidemia, unspecified: Secondary | ICD-10-CM | POA: Diagnosis not present

## 2020-08-02 DIAGNOSIS — Z8739 Personal history of other diseases of the musculoskeletal system and connective tissue: Secondary | ICD-10-CM | POA: Diagnosis not present

## 2020-08-04 ENCOUNTER — Encounter: Payer: Self-pay | Admitting: Allergy & Immunology

## 2020-08-04 ENCOUNTER — Other Ambulatory Visit: Payer: Self-pay

## 2020-08-04 ENCOUNTER — Ambulatory Visit (INDEPENDENT_AMBULATORY_CARE_PROVIDER_SITE_OTHER): Payer: Medicare Other | Admitting: Allergy & Immunology

## 2020-08-04 VITALS — BP 126/74 | HR 77 | Resp 16

## 2020-08-04 DIAGNOSIS — J302 Other seasonal allergic rhinitis: Secondary | ICD-10-CM | POA: Diagnosis not present

## 2020-08-04 DIAGNOSIS — J342 Deviated nasal septum: Secondary | ICD-10-CM | POA: Diagnosis not present

## 2020-08-04 DIAGNOSIS — J3089 Other allergic rhinitis: Secondary | ICD-10-CM | POA: Diagnosis not present

## 2020-08-04 NOTE — Patient Instructions (Addendum)
1. Seasonal and perennial allergic rhinitis (grasses, weeds, trees, indoor molds, outdoor molds, dust mites, cat, dog and cockroach) - Continue taking: Astelin (azelastine) 2 sprays per nostril 1-2 times daily as needed - You can use two of the Zyrtec or Xyzal on bad days, like before you go out to mow.   2. Return in about 1 year (around 08/04/2021).    Please inform us of any Emergency Department visits, hospitalizations, or changes in symptoms. Call us before going to the ED for breathing or allergy symptoms since we might be able to fit you in for a sick visit. Feel free to contact us anytime with any questions, problems, or concerns.  It was a pleasure to see you again today!  Websites that have reliable patient information: 1. American Academy of Asthma, Allergy, and Immunology: www.aaaai.org 2. Food Allergy Research and Education (FARE): foodallergy.org 3. Mothers of Asthmatics: http://www.asthmacommunitynetwork.org 4. American College of Allergy, Asthma, and Immunology: www.acaai.org   COVID-19 Vaccine Information can be found at: [email or web address].[email or web address].uk For questions related to vaccine distribution or appointments, please email [email or web address] or call [phone].   We realize that you might be concerned about having an allergic reaction to the COVID19 vaccines. To help with that concern, WE ARE OFFERING THE COVID19 VACCINES IN OUR OFFICE! Ask the front desk for dates!     "Like" [place] on Facebook and Instagram for our latest updates!      A healthy democracy works best when [employer] participate! Make sure you are registered to vote! If you have moved or changed any of your contact information, you will need to get this updated before voting!  In some cases, you MAY be able to register to vote online: [email or web address]

## 2020-08-04 NOTE — Progress Notes (Signed)
FOLLOW UP  Date of Service/Encounter:  08/04/20   Assessment:   Seasonal and perennial allergic rhinitis (grasses, weeds, trees, indoor molds, outdoor molds, dust mites, cat, dog and cockroach)  Idiopathic gout - followed by Dr. Estanislado Pandy and controlled on allopurinol  Rightward septal deviation  Plan/Recommendations:   1. Seasonal and perennial allergic rhinitis (grasses, weeds, trees, indoor molds, outdoor molds, dust mites, cat, dog and cockroach) - Continue taking: budesonide nasal saline rinses twice daily and Astelin (azelastine) 2 sprays per nostril 1-2 times daily as needed - You can use the Astelin more often if needed for worsening times of the year (max two sprays per nostril twice daily).   2. Return in about 6 months (around 02/04/2021).   Subjective:   Dennis Miles is a 70 y.o. male presenting today for follow up of  Chief Complaint  Patient presents with  . Sinus Problem    AYHAM WORD has a history of the following: Patient Active Problem List   Diagnosis Date Noted  . Primary insomnia 09/26/2016  . DJD (degenerative joint disease), cervical 09/26/2016  . 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 Abdominal pain, left lower quadrant 10/19/2012  . GERD (gastroesophageal reflux disease) 04/15/2012  . Gout 04/01/2011  . Hypertension 04/01/2011  . Elevated cholesterol with high triglycerides 04/01/2011    History obtained from: chart review and patient.  Dennis Miles is a 70 y.o. male presenting for a follow up visit. He was last seen in January 2022 for evaluation of allergic rhinitis.  At that time, he had testing that was positive to grasses, weeds, trees, indoor molds, outdoor molds, dust mites, cat, dog and cockroach.  We will start him on budesonide nasal rinses twice daily as well as Astelin twice daily.  He had previously been on multiple antihistamines.  We did discuss allergy shots as a means of Dennis-term control.  Since  last visit, he has done very well. He has been doing the Flonase and the other medications and it has gone away completely. The only thing that he is concerned with is why was it only on one side and not both.   He is actually doing the azelastine alone usually once per day. He also has cetirizine or Xyzal to use as needed. Overall it is completely cleared up. This was only on the right side. He does have a deviated septum, which is why he might have been more symptomatic on that side.    He had some skin cancer removed on his left side of his temple three days ago. This was done at the [facility] by Dr. Fredia Sorrow. He liked this surgeon and reported that he did a good job.   He did have a fall in June 2021 landed on concrete. This did mess up his face.  He does not think he broke anything.  He never saw otolaryngology or plastic surgery after that.  Then he got COVID-19 in January and the congestion got worse.  He was fully vaccinated.  Otherwise, there have been no changes to his past medical history, surgical history, family history, or social history.    Review of Systems  Constitutional: Negative.  Negative for fever, malaise/fatigue and weight loss.  HENT: Negative.  Negative for congestion, ear discharge and ear pain.   Eyes: Negative for pain, discharge and redness.  Respiratory: Negative for cough, sputum production, shortness of breath and wheezing.   Cardiovascular: Negative.  Negative for chest pain  and palpitations.  Gastrointestinal: Negative for abdominal pain, constipation, diarrhea, heartburn, nausea and vomiting.  Skin: Negative.  Negative for itching and rash.  Neurological: Negative for dizziness and headaches.  Endo/Heme/Allergies: Negative for environmental allergies. Does not bruise/bleed easily.       Objective:   Blood pressure 126/74, pulse 77, resp. rate 16, SpO2 100 %. There is no height or weight on file to calculate BMI.   Physical Exam:  Physical  Exam Constitutional:      Appearance: He is well-developed.  HENT:     Head: Normocephalic and atraumatic.     Comments: He does have a gauze over his left temple.    Right Ear: Tympanic membrane, ear canal and external ear normal.     Left Ear: Tympanic membrane, ear canal and external ear normal.     Nose: Septal deviation and mucosal edema present. No nasal deformity, rhinorrhea or epistaxis.     Right Turbinates: Enlarged. Not swollen or pale.     Left Turbinates: Enlarged. Not swollen or pale.     Right Sinus: No maxillary sinus tenderness or frontal sinus tenderness.     Left Sinus: No maxillary sinus tenderness or frontal sinus tenderness.     Mouth/Throat:     Mouth: Oropharynx is clear and moist. Mucous membranes are not pale and not dry.     Pharynx: Uvula midline.     Comments: Cobblestoning present posterior oropharynx. Eyes:     General:        Right eye: No discharge.        Left eye: No discharge.     Extraocular Movements: EOM normal.     Conjunctiva/sclera: Conjunctivae normal.     Right eye: Right conjunctiva is not injected. No chemosis.    Left eye: Left conjunctiva is not injected. No chemosis.    Pupils: Pupils are equal, round, and reactive to light.  Cardiovascular:     Rate and Rhythm: Normal rate and regular rhythm.     Heart sounds: Normal heart sounds.  Pulmonary:     Effort: Pulmonary effort is normal. No tachypnea, accessory muscle usage or respiratory distress.     Breath sounds: Normal breath sounds. No wheezing, rhonchi or rales.     Comments: Moving air well in all lung fields.  No increased work of breathing. Chest:     Chest wall: No tenderness.  Lymphadenopathy:     Cervical: No cervical adenopathy.  Skin:    General: Skin is warm.     Capillary Refill: Capillary refill takes less than 2 seconds.     Coloration: Skin is not pale.     Findings: No abrasion, erythema, petechiae or rash. Rash is not papular, urticarial or vesicular.      Comments: No eczematous or urticarial lesions noted. Ms. Collene Mares  Neurological:     Mental Status: He is alert.  Psychiatric:        Mood and Affect: Mood and affect normal.      Diagnostic studies: none        Salvatore Marvel, MD  [facility]

## 2020-08-09 DIAGNOSIS — M1A09X0 Idiopathic chronic gout, multiple sites, without tophus (tophi): Secondary | ICD-10-CM | POA: Diagnosis not present

## 2020-08-09 DIAGNOSIS — E790 Hyperuricemia without signs of inflammatory arthritis and tophaceous disease: Secondary | ICD-10-CM | POA: Diagnosis not present

## 2020-08-09 DIAGNOSIS — E782 Mixed hyperlipidemia: Secondary | ICD-10-CM | POA: Diagnosis not present

## 2020-08-09 DIAGNOSIS — K219 Gastro-esophageal reflux disease without esophagitis: Secondary | ICD-10-CM | POA: Diagnosis not present

## 2020-08-09 DIAGNOSIS — G5601 Carpal tunnel syndrome, right upper limb: Secondary | ICD-10-CM | POA: Diagnosis not present

## 2020-08-09 DIAGNOSIS — M542 Cervicalgia: Secondary | ICD-10-CM | POA: Diagnosis not present

## 2020-08-09 DIAGNOSIS — G4700 Insomnia, unspecified: Secondary | ICD-10-CM | POA: Diagnosis not present

## 2020-08-09 DIAGNOSIS — I10 Essential (primary) hypertension: Secondary | ICD-10-CM | POA: Diagnosis not present

## 2020-09-05 DIAGNOSIS — L570 Actinic keratosis: Secondary | ICD-10-CM | POA: Diagnosis not present

## 2020-09-11 ENCOUNTER — Ambulatory Visit (INDEPENDENT_AMBULATORY_CARE_PROVIDER_SITE_OTHER): Payer: Medicare Other | Admitting: Gastroenterology

## 2020-09-12 ENCOUNTER — Ambulatory Visit (INDEPENDENT_AMBULATORY_CARE_PROVIDER_SITE_OTHER): Payer: Medicare Other | Admitting: Internal Medicine

## 2020-11-06 DIAGNOSIS — L570 Actinic keratosis: Secondary | ICD-10-CM | POA: Diagnosis not present

## 2020-11-10 DIAGNOSIS — D2271 Melanocytic nevi of right lower limb, including hip: Secondary | ICD-10-CM | POA: Diagnosis not present

## 2020-11-10 DIAGNOSIS — Z85828 Personal history of other malignant neoplasm of skin: Secondary | ICD-10-CM | POA: Diagnosis not present

## 2020-11-10 DIAGNOSIS — L578 Other skin changes due to chronic exposure to nonionizing radiation: Secondary | ICD-10-CM | POA: Diagnosis not present

## 2020-11-10 DIAGNOSIS — D485 Neoplasm of uncertain behavior of skin: Secondary | ICD-10-CM | POA: Diagnosis not present

## 2020-11-10 DIAGNOSIS — L821 Other seborrheic keratosis: Secondary | ICD-10-CM | POA: Diagnosis not present

## 2020-11-10 DIAGNOSIS — L814 Other melanin hyperpigmentation: Secondary | ICD-10-CM | POA: Diagnosis not present

## 2020-11-10 DIAGNOSIS — D225 Melanocytic nevi of trunk: Secondary | ICD-10-CM | POA: Diagnosis not present

## 2020-11-10 DIAGNOSIS — L570 Actinic keratosis: Secondary | ICD-10-CM | POA: Diagnosis not present

## 2020-11-10 DIAGNOSIS — C44619 Basal cell carcinoma of skin of left upper limb, including shoulder: Secondary | ICD-10-CM | POA: Diagnosis not present

## 2020-11-13 ENCOUNTER — Ambulatory Visit (INDEPENDENT_AMBULATORY_CARE_PROVIDER_SITE_OTHER): Payer: Medicare Other | Admitting: Gastroenterology

## 2020-11-13 ENCOUNTER — Encounter (INDEPENDENT_AMBULATORY_CARE_PROVIDER_SITE_OTHER): Payer: Self-pay | Admitting: Gastroenterology

## 2020-11-13 ENCOUNTER — Other Ambulatory Visit: Payer: Self-pay

## 2020-11-13 VITALS — BP 130/72 | HR 72 | Temp 98.0°F | Ht 69.0 in | Wt 156.0 lb

## 2020-11-13 DIAGNOSIS — K589 Irritable bowel syndrome without diarrhea: Secondary | ICD-10-CM | POA: Insufficient documentation

## 2020-11-13 DIAGNOSIS — K2100 Gastro-esophageal reflux disease with esophagitis, without bleeding: Secondary | ICD-10-CM

## 2020-11-13 DIAGNOSIS — K582 Mixed irritable bowel syndrome: Secondary | ICD-10-CM | POA: Diagnosis not present

## 2020-11-13 MED ORDER — DICYCLOMINE HCL 10 MG PO CAPS: 10.0000 mg | ORAL_CAPSULE | Freq: Two times a day (BID) | ORAL | 2 refills | Status: DC | PRN

## 2020-11-13 NOTE — Patient Instructions (Signed)
Continue Protonix 20 mg qday Can take Miralax as needed for constipation Start Bentyl 1 tablet q12h as needed for abdominal pain episodes

## 2020-11-13 NOTE — Progress Notes (Signed)
Maylon Peppers, M.D. Gastroenterology & Hepatology [facility] [address] [place], [place] [address]  Primary Care Physician: Celene Squibb, MD [place] [address]  I will communicate my assessment and recommendations to the referring MD via EMR.  Problems: GERD History of peptic stricture  History of Present Illness: Dennis Miles is a 70 y.o. male with PMH GERD, anxiety, HLD, HTN, who presents for follow up of GERD.  The patient was last seen on 06/28/2019. At that time, the patient was counseled to continue his PPI for GERD.  Patient reports that after he had COVID in new year's, he has felt very fatigued. Overall, he states he has not been able to recover his energy status, as he has felt chronically tired.  Patient reports that he has not presented any dysphagia to food. Recently he took couple of pills of Tylenol that got stuck in his throat, which he had to vomit. Simialr episodes have happened x2 since May 2022 but they are infrequent. Denies having any issues with odynophagia. He has presented some sore throat, which some of his doctors have suggested are secondary to food allergies. He has very seldom episodes of heartburn when he eats something very spicy, but this is infrequent. Denies any regurgitation. He is taking Protonix 20 mg qday.  Patient endorsed that for the last couple of months he has presrnted some L sided flank pain, especially when constipated. The pain is intermittent. States that he has had chronic issues with intermittent constipation and diarrhea for multiple years.  The patient denies having any nausea, vomiting, fever, chills, hematochezia, melena, hematemesis, abdominal distention, diarrhea, jaundice, pruritus or weight loss.  Last EGD: 2012 No evidence of erosive esophagitis. Distal esophageal stricture dilated by passing 54 [place] Maloney dilator. Mucosal changes at  esophageal body are suggestive of eosinophilic esophagitis. Last Colonoscopy: May 2014 with removal of 3 mm tubular adenoma. Patient advised to repeat in 10 years.  Past Medical History: Past Medical History:  Diagnosis Date   Anxiety    GERD (gastroesophageal reflux disease)    Gout    Headache(784.0)    Hiatal hernia    High cholesterol    Hypertension    Skin cancer     Past Surgical History: Past Surgical History:  Procedure Laterality Date   APPENDECTOMY     COLONOSCOPY N/A 10/23/2012   Procedure: COLONOSCOPY;  Surgeon: Rogene Houston, MD;  [facility];  Service: Endoscopy;  Laterality: N/A;  235   ESOPHAGOGASTRODUODENOSCOPY  04/17/2011   Procedure: ESOPHAGOGASTRODUODENOSCOPY (EGD);  Surgeon: Rogene Houston, MD;  [facility];  Service: Endoscopy;  Laterality: N/A;  8:30   HAND SURGERY  05/28/2016   INGUINAL HERNIA REPAIR Left 02/17/2013   Procedure: HERNIA REPAIR INGUINAL ADULT;  Surgeon: Jamesetta So, MD;  Location: [facility];  Service: General;  Laterality: Left;   INSERTION OF MESH Left 02/17/2013   Procedure: INSERTION OF MESH;  Surgeon: Jamesetta So, MD;  Location: [facility];  Service: General;  Laterality: Left;   SINOSCOPY     sinus sugery     SKIN CANCER EXCISION      Family History: Family History  Problem Relation Age of Onset   Aneurysm Father    Asthma Child    Colon cancer Neg Hx     Social History: Social History   Tobacco Use  Smoking Status Never  Smokeless Tobacco Former   Types:  Sarina Ser   Quit date: 06/03/2012   Social History   Substance and Sexual Activity  Alcohol Use Yes   Alcohol/week: 1.0 standard drink   Types: 1 Cans of beer per week   Comment: occ   Social History   Substance and Sexual Activity  Drug Use Never    Allergies: Allergies  Allergen Reactions   Codeine Nausea Only    Medications: Current Outpatient Medications  Medication Sig Dispense Refill   allopurinol (ZYLOPRIM) 100 MG tablet Take  2 tablets (200 mg total) by mouth daily. 180 tablet 0   Azelastine HCl 0.15 % SOLN 2 sprays 1-2 times daily as needed 30 mL 5   celecoxib (CELEBREX) 200 MG capsule Take 200 mg by mouth daily. PRN     ezetimibe (ZETIA) 10 MG tablet ezetimibe 10 mg tablet  TAKE 1 TABLET BY MOUTH ONCE DAILY     levocetirizine (XYZAL) 5 MG tablet Take 5 mg by mouth daily.     lisinopril (PRINIVIL,ZESTRIL) 10 MG tablet Take 10 mg by mouth daily.     methocarbamol (ROBAXIN) 750 MG tablet Take 750 mg by mouth as needed for muscle spasms.     pantoprazole (PROTONIX) 20 MG tablet pantoprazole 20 mg tablet,delayed release  Take 2 tablets every day by oral route.     zolpidem (AMBIEN) 10 MG tablet Take 10 mg by mouth at bedtime. For sleep     cyclobenzaprine (FLEXERIL) 5 MG tablet cyclobenzaprine 5 mg tablet  TAKE 1 TABLET BY MOUTH THREE TIMES DAILY AS NEEDED FOR MUSCLE SPASM (Patient not taking: Reported on 11/13/2020)     fluticasone (FLONASE) 50 MCG/ACT nasal spray  (Patient not taking: Reported on 11/13/2020)     No current facility-administered medications for this visit.    Review of Systems: GENERAL: negative for malaise, night sweats HEENT: No changes in hearing or vision, no nose bleeds or other nasal problems. NECK: Negative for lumps, goiter, pain and significant neck swelling RESPIRATORY: Negative for cough, wheezing CARDIOVASCULAR: Negative for chest pain, leg swelling, palpitations, orthopnea GI: SEE HPI MUSCULOSKELETAL: Negative for joint pain or swelling, back pain, and muscle pain. SKIN: Negative for lesions, rash PSYCH: Negative for sleep disturbance, mood disorder and recent psychosocial stressors. HEMATOLOGY Negative for prolonged bleeding, bruising easily, and swollen nodes. ENDOCRINE: Negative for cold or heat intolerance, polyuria, polydipsia and goiter. NEURO: negative for tremor, gait imbalance, syncope and seizures. The remainder of the review of systems is noncontributory.   Physical  Exam: BP 130/72 (BP Location: Right Arm, Patient Position: Sitting, Cuff Size: Small)   Pulse 72   Temp 98 F (36.7 C) (Oral)   Ht 5\' 9"  (1.753 m)   Wt 156 lb (70.8 kg)   BMI 23.04 kg/m  GENERAL: The patient is AO x3, in no acute distress. HEENT: Head is normocephalic and atraumatic. EOMI are intact. Mouth is well hydrated and without lesions. NECK: Supple. No masses LUNGS: Clear to auscultation. No presence of rhonchi/wheezing/rales. Adequate chest expansion HEART: RRR, normal s1 and s2. ABDOMEN: Soft, nontender, no guarding, no peritoneal signs, and nondistended. BS +. No masses. EXTREMITIES: Without any cyanosis, clubbing, rash, lesions or edema. NEUROLOGIC: AOx3, no focal motor deficit. SKIN: no jaundice, no rashes  Imaging/Labs: as above  I personally reviewed and interpreted the available labs, imaging and endoscopic files.  Impression and Plan: Dennis Miles is a 70 y.o. male with PMH GERD, anxiety, HLD, HTN, who presents for follow up of GERD.he has presented adequate control of his GERD  symptoms, although he has presented a few episodes of pill dysphagia. I discussed with the patient the possibility of proceeding with an EGD vs monitoring for now until his symptoms become more frequent. A shared decision was made and he will notify me if he has worsening symptoms, for which he will be scheduled for an EGD. He should continue taking his PPI at the same dosage indefinitely given his history of peptic strictures.  Regarding his abdominal pain, he has not presented any red flag signs and could be related to constipation. 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 He may benefit from taking Bentyl as needed and Miralax to improve his BM frequency.  - Continue Protonix 20 mg qday - Can take Miralax as needed for constipation - Start Bentyl 1 tablet q12h as needed for abdominal pain episodes  All  questions were answered.      Harvel Quale, MD Gastroenterology and Hepatology [facility]

## 2020-11-16 ENCOUNTER — Other Ambulatory Visit (INDEPENDENT_AMBULATORY_CARE_PROVIDER_SITE_OTHER): Payer: Self-pay

## 2020-11-30 DIAGNOSIS — L570 Actinic keratosis: Secondary | ICD-10-CM | POA: Diagnosis not present

## 2020-11-30 DIAGNOSIS — C44519 Basal cell carcinoma of skin of other part of trunk: Secondary | ICD-10-CM | POA: Diagnosis not present

## 2020-12-13 DIAGNOSIS — R0982 Postnasal drip: Secondary | ICD-10-CM | POA: Diagnosis not present

## 2020-12-13 DIAGNOSIS — J342 Deviated nasal septum: Secondary | ICD-10-CM | POA: Diagnosis not present

## 2020-12-13 DIAGNOSIS — J310 Chronic rhinitis: Secondary | ICD-10-CM | POA: Diagnosis not present

## 2020-12-13 DIAGNOSIS — J343 Hypertrophy of nasal turbinates: Secondary | ICD-10-CM | POA: Diagnosis not present

## 2020-12-13 DIAGNOSIS — R070 Pain in throat: Secondary | ICD-10-CM | POA: Diagnosis not present

## 2020-12-26 NOTE — Progress Notes (Signed)
Office Visit Note  Patient: Dennis Miles             Date of Birth: 1951-02-23           MRN: [id number]             PCP: Dennis Squibb, MD Referring: Dennis Squibb, MD Visit Date: 01/09/2021 Occupation: '@GUAROCC'$ @  Subjective:  Neck stiffness.   History of Present Illness: Dennis Miles is a 70 y.o. male with a history of gouty arthropathy, degenerative disease of cervical spine and osteoarthritis.  He states he has not had a gout flare since her last visit.  The dose of allopurinol was increased to 200 mg p.o. daily due to elevation in the uric acid.  He states he is continues to have pain and discomfort in his cervical spine.  He also has some discomfort in his left shoulder.  He has been taking Celebrex on a regular basis due to neck pain.  He states that Flexeril '5mg'$  p.o. nightly as needed has been more helpful but he takes it on a as needed basis.  Insomnia is better on Ambien.  He states thathis reflux symptoms have improved.  Activities of Daily Living:  Patient reports morning stiffness for 0 minutes.   Patient Denies nocturnal pain.  Difficulty dressing/grooming: Denies Difficulty climbing stairs: Denies Difficulty getting out of chair: Denies Difficulty using hands for taps, buttons, cutlery, and/or writing: Denies  Review of Systems  Constitutional:  Positive for fatigue.  HENT:  Negative for mouth sores, mouth dryness and nose dryness.   Eyes:  Negative for pain, itching and dryness.  Respiratory:  Negative for shortness of breath and difficulty breathing.   Cardiovascular:  Negative for chest pain and palpitations.  Gastrointestinal:  Negative for blood in stool, constipation and diarrhea.  Endocrine: Negative for increased urination.  Genitourinary:  Negative for difficulty urinating.  Musculoskeletal:  Positive for myalgias, muscle tenderness and myalgias. Negative for joint pain, joint pain, joint swelling and morning stiffness.  Skin:  Negative for color  change, rash and redness.  Allergic/Immunologic: Negative for susceptible to infections.  Neurological:  Positive for numbness. Negative for dizziness, headaches and memory loss.  Hematological:  Negative for bruising/bleeding tendency.  Psychiatric/Behavioral:  Negative for confusion.    PMFS History:  Patient Active Problem List   Diagnosis Date Noted   IBS (irritable bowel syndrome) 11/13/2020   Primary insomnia 09/26/2016   DJD (degenerative joint disease), cervical 09/26/2016   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   Abdominal pain, left lower quadrant 10/19/2012   GERD (gastroesophageal reflux disease) 04/15/2012   Gout 04/01/2011   Hypertension 04/01/2011   Elevated cholesterol with high triglycerides 04/01/2011    Past Medical History:  Diagnosis Date   Anxiety    GERD (gastroesophageal reflux disease)    Gout    Headache(784.0)    Hiatal hernia    High cholesterol    Hypertension    Skin cancer     Family History  Problem Relation Age of Onset   Aneurysm Father    Asthma Child    Colon cancer Neg Hx    Past Surgical History:  Procedure Laterality Date   APPENDECTOMY     COLONOSCOPY N/A 10/23/2012   Procedure: COLONOSCOPY;  Surgeon: Rogene Houston, MD;  [facility];  Service: Endoscopy;  Laterality: N/A;  235   ESOPHAGOGASTRODUODENOSCOPY  04/17/2011   Procedure: ESOPHAGOGASTRODUODENOSCOPY (EGD);  Surgeon: Rogene Houston, MD;  [facility];  Service: Endoscopy;  Laterality: N/A;  8:30   HAND SURGERY  05/28/2016   INGUINAL HERNIA REPAIR Left 02/17/2013   Procedure: HERNIA REPAIR INGUINAL ADULT;  Surgeon: Jamesetta So, MD;  Location: [facility];  Service: General;  Laterality: Left;   INSERTION OF MESH Left 02/17/2013   Procedure: INSERTION OF MESH;  Surgeon: Jamesetta So, MD;  Location: [facility];  Service: General;  Laterality: Left;   SINOSCOPY     sinus sugery     SKIN CANCER EXCISION     x4   Social  History   Social History Narrative   Lives at home with daughter.  Retired.  Education 12th grade.  Children 2.     Immunization History  Administered Date(s) Administered   PFIZER(Purple Top)SARS-COV-2 Vaccination 08/02/2019, 08/23/2019, 06/22/2020     Objective: Vital Signs: BP 139/88 (BP Location: Left Arm, Patient Position: Sitting, Cuff Size: Normal)   Pulse 70   Ht '5\' 10"'$  (1.778 m)   Wt 162 lb 9.6 oz (73.8 kg)   BMI 23.33 kg/m    Physical Exam Vitals and nursing note reviewed.  Constitutional:      Appearance: He is well-developed.  HENT:     Head: Normocephalic and atraumatic.  Eyes:     Conjunctiva/sclera: Conjunctivae normal.     Pupils: Pupils are equal, round, and reactive to light.  Cardiovascular:     Rate and Rhythm: Normal rate and regular rhythm.     Heart sounds: Normal heart sounds.  Pulmonary:     Effort: Pulmonary effort is normal.     Breath sounds: Normal breath sounds.  Abdominal:     General: Bowel sounds are normal.     Palpations: Abdomen is soft.  Musculoskeletal:     Cervical back: Normal range of motion and neck supple.  Skin:    General: Skin is warm and dry.     Capillary Refill: Capillary refill takes less than 2 seconds.  Neurological:     Mental Status: He is alert and oriented to person, place, and time.  Psychiatric:        Behavior: Behavior normal.     Musculoskeletal Exam: He has some stiffness with range of motion of his cervical spine especially with lateral rotation.  Shoulder joints are in good range of motion.  Elbow joints wrist joints MCPs PIPs and DIPs with good range of motion.  Hip joints, knee joints, and ankle joints in good range of motion.  He had no tenderness over ankles or MTPs.  CDAI Exam: CDAI Score: -- Patient Global: --; Provider Global: -- Swollen: --; Tender: -- Joint Exam 01/09/2021   No joint exam has been documented for this visit   There is currently no information documented on the homunculus.  Go to the Rheumatology activity and complete the homunculus joint exam.  Investigation: No additional findings.  Imaging: No results found.  Recent Labs: Lab Results  Component Value Date   WBC 5.1 07/11/2020   HGB 14.3 07/11/2020   PLT 178 07/11/2020   NA 141 07/11/2020   K 5.1 07/11/2020   CL 105 07/11/2020   CO2 28 07/11/2020   GLUCOSE 92 07/11/2020   BUN 8 07/11/2020   CREATININE 1.16 07/11/2020   BILITOT 1.1 07/11/2020   ALKPHOS 71 09/26/2016   AST 22 07/11/2020   ALT 17 07/11/2020   PROT 6.7 07/11/2020   ALBUMIN 4.2 09/26/2016   CALCIUM 9.4 07/11/2020   GFRAA 75 07/11/2020  Speciality Comments: No specialty comments available.  Procedures:  No procedures performed Allergies: Codeine   Assessment / Plan:     Visit Diagnoses: Idiopathic chronic gout of multiple sites with tophus - uric acid: 07/11/2020 7.1.  Allopurinol dose was increased after the last visit to 200 mg p.o. daily.  He has not had a gout flare.  He did not require Mitigare.  We will obtain uric acid levels today.  We will contact him once the results are available.  Medication management - Allopurinol 200 mg po daily and mitigare 0.6 mg po as needed.  Labs obtained on July 11, 2020 showed normal CBC, CMP and uric acid of 7.1.  We will check labs today.  Chronic left shoulder pain - secondary to the fall in June 2021.  His shoulder joint was in good range of motion today.  He still complains of some off-and-on discomfort.  I gave him a handout on shoulder joint exercises.  Primary osteoarthritis of both feet-doing well.  DDD (degenerative disc disease), cervical - he has seen Dr. Carloyn Manner in the past.  I reviewed takes x-rays from 2021 which showed facet joint arthropathy.  Some range of motion exercises were demonstrated in the office.  I also gave him a handout on C-spine exercises.  He has muscle relaxers which she take occasionally.  Primary insomnia-better on Ambien.  History of gastroesophageal  reflux (GERD)-he states test his reflux symptoms improved on pantoprazole.  He has not been taking pantoprazole on a regular basis.  I have cautioned him about the use of Celebrex and reflux.  Side effects of Celebrex including elevation in LFTs and creatinine were also discussed.  History of hypertension-his blood pressure was normal today.  History of hyperlipidemia-increased risk of heart disease with gouty arthropathy was discussed.  Dietary modifications and exercise were discussed and a handout was placed in the AVS.  COVID-19 virus infection - he had COVID-19 virus infection on June 03, 2020.  Orders: Orders Placed This Encounter  Procedures   CBC with Differential/Platelet   COMPLETE METABOLIC PANEL WITH GFR   Uric acid    No orders of the defined types were placed in this encounter.    Follow-Up Instructions: Return in about 6 months (around 07/12/2021) for Gout and OA.   Bo Merino, MD  Note - This record has been created using Editor, commissioning.  Chart creation errors have been sought, but may not always  have been located. Such creation errors do not reflect on  the standard of medical care.

## 2021-01-09 ENCOUNTER — Ambulatory Visit (INDEPENDENT_AMBULATORY_CARE_PROVIDER_SITE_OTHER): Payer: Medicare Other | Admitting: Rheumatology

## 2021-01-09 ENCOUNTER — Encounter: Payer: Self-pay | Admitting: Rheumatology

## 2021-01-09 ENCOUNTER — Other Ambulatory Visit: Payer: Self-pay

## 2021-01-09 VITALS — BP 139/88 | HR 70 | Ht 70.0 in | Wt 162.6 lb

## 2021-01-09 DIAGNOSIS — G8929 Other chronic pain: Secondary | ICD-10-CM | POA: Diagnosis not present

## 2021-01-09 DIAGNOSIS — Z79899 Other long term (current) drug therapy: Secondary | ICD-10-CM | POA: Diagnosis not present

## 2021-01-09 DIAGNOSIS — M25512 Pain in left shoulder: Secondary | ICD-10-CM

## 2021-01-09 DIAGNOSIS — Z8679 Personal history of other diseases of the circulatory system: Secondary | ICD-10-CM | POA: Diagnosis not present

## 2021-01-09 DIAGNOSIS — Z8639 Personal history of other endocrine, nutritional and metabolic disease: Secondary | ICD-10-CM | POA: Diagnosis not present

## 2021-01-09 DIAGNOSIS — M19071 Primary osteoarthritis, right ankle and foot: Secondary | ICD-10-CM | POA: Diagnosis not present

## 2021-01-09 DIAGNOSIS — Z8719 Personal history of other diseases of the digestive system: Secondary | ICD-10-CM

## 2021-01-09 DIAGNOSIS — M5030 Other cervical disc degeneration, unspecified cervical region: Secondary | ICD-10-CM | POA: Diagnosis not present

## 2021-01-09 DIAGNOSIS — M19072 Primary osteoarthritis, left ankle and foot: Secondary | ICD-10-CM

## 2021-01-09 DIAGNOSIS — U071 COVID-19: Secondary | ICD-10-CM

## 2021-01-09 DIAGNOSIS — M1A09X1 Idiopathic chronic gout, multiple sites, with tophus (tophi): Secondary | ICD-10-CM

## 2021-01-09 DIAGNOSIS — F5101 Primary insomnia: Secondary | ICD-10-CM | POA: Diagnosis not present

## 2021-01-09 LAB — COMPLETE METABOLIC PANEL WITH GFR
AG Ratio: 1.5 (calc) (ref 1.0–2.5)
ALT: 22 U/L (ref 9–46)
AST: 26 U/L (ref 10–35)
Albumin: 4 g/dL (ref 3.6–5.1)
Alkaline phosphatase (APISO): 54 U/L (ref 35–144)
BUN: 7 mg/dL (ref 7–25)
CO2: 29 mmol/L (ref 20–32)
Calcium: 9.2 mg/dL (ref 8.6–10.3)
Chloride: 101 mmol/L (ref 98–110)
Creat: 1.03 mg/dL (ref 0.70–1.35)
Globulin: 2.7 g/dL (calc) (ref 1.9–3.7)
Glucose, Bld: 89 mg/dL (ref 65–99)
Potassium: 4.8 mmol/L (ref 3.5–5.3)
Sodium: 138 mmol/L (ref 135–146)
Total Bilirubin: 0.4 mg/dL (ref 0.2–1.2)
Total Protein: 6.7 g/dL (ref 6.1–8.1)
eGFR: 80 mL/min/{1.73_m2} (ref >=60)

## 2021-01-09 LAB — CBC WITH DIFFERENTIAL/PLATELET
Absolute Monocytes: 744 cells/uL (ref 200–950)
Basophils Absolute: 31 cells/uL (ref 0–200)
Basophils Relative: 0.5 %
Eosinophils Absolute: 329 cells/uL (ref 15–500)
Eosinophils Relative: 5.4 %
HCT: 41.6 % (ref 38.5–50.0)
Hemoglobin: 13.9 g/dL (ref 13.2–17.1)
Lymphs Abs: 1824 cells/uL (ref 850–3900)
MCH: 33.7 pg — ABNORMAL HIGH (ref 27.0–33.0)
MCHC: 33.4 g/dL (ref 32.0–36.0)
MCV: 100.7 fL — ABNORMAL HIGH (ref 80.0–100.0)
MPV: 12.5 fL (ref 7.5–12.5)
Monocytes Relative: 12.2 %
Neutro Abs: 3172 cells/uL (ref 1500–7800)
Neutrophils Relative %: 52 %
Platelets: 192 10*3/uL (ref 140–400)
RBC: 4.13 10*6/uL — ABNORMAL LOW (ref 4.20–5.80)
RDW: 11.9 % (ref 11.0–15.0)
Total Lymphocyte: 29.9 %
WBC: 6.1 10*3/uL (ref 3.8–10.8)

## 2021-01-09 LAB — URIC ACID: Uric Acid, Serum: 6 mg/dL (ref 4.0–8.0)

## 2021-01-09 NOTE — Patient Instructions (Addendum)
Shoulder Exercises Ask your health care provider which exercises are safe for you. Do exercises exactly as told by your health care provider and adjust them as directed. It is normal to feel mild stretching, pulling, tightness, or discomfort as you do these exercises. Stop right away if you feel sudden pain or your pain gets worse. Do not begin these exercises until told by your health care provider. Stretching exercises External rotation and abduction This exercise is sometimes called corner stretch. This exercise rotates your arm outward (external rotation) and moves your arm out from your body (abduction). Stand in a doorway with one of your feet slightly in front of the other. This is called a staggered stance. If you cannot reach your forearms to the door frame, stand facing a corner of a room. Choose one of the following positions as told by your health care provider: Place your hands and forearms on the door frame above your head. Place your hands and forearms on the door frame at the height of your head. Place your hands on the door frame at the height of your elbows. Slowly move your weight onto your front foot until you feel a stretch across your chest and in the front of your shoulders. Keep your head and chest upright and keep your abdominal muscles tight. Hold for __________ seconds. To release the stretch, shift your weight to your back foot. Repeat __________ times. Complete this exercise __________ times a day. Extension, standing Stand and hold a broomstick, a cane, or a similar object behind your back. Your hands should be a little wider than shoulder width apart. Your palms should face away from your back. Keeping your elbows straight and your shoulder muscles relaxed, move the stick away from your body until you feel a stretch in your shoulders (extension). Avoid shrugging your shoulders while you move the stick. Keep your shoulder blades tucked down toward the middle of your  back. Hold for __________ seconds. Slowly return to the starting position. Repeat __________ times. Complete this exercise __________ times a day. Range-of-motion exercises Pendulum  Stand near a wall or a surface that you can hold onto for balance. Bend at the waist and let your left / right arm hang straight down. Use your other arm to support you. Keep your back straight and do not lock your knees. Relax your left / right arm and shoulder muscles, and move your hips and your trunk so your left / right arm swings freely. Your arm should swing because of the motion of your body, not because you are using your arm or shoulder muscles. Keep moving your hips and trunk so your arm swings in the following directions, as told by your health care provider: Side to side. Forward and backward. In clockwise and counterclockwise circles. Continue each motion for __________ seconds, or for as long as told by your health care provider. Slowly return to the starting position. Repeat __________ times. Complete this exercise __________ times a day. Shoulder flexion, standing  Stand and hold a broomstick, a cane, or a similar object. Place your hands a little more than shoulder width apart on the object. Your left / right hand should be palm up, and your other hand should be palm down. Keep your elbow straight and your shoulder muscles relaxed. Push the stick up with your healthy arm to raise your left / right arm in front of your body, and then over your head until you feel a stretch in your shoulder (flexion). Avoid   shrugging your shoulder while you raise your arm. Keep your shoulder blade tucked down toward the middle of your back. Hold for __________ seconds. Slowly return to the starting position. Repeat __________ times. Complete this exercise __________ times a day. Shoulder abduction, standing Stand and hold a broomstick, a cane, or a similar object. Place your hands a little more than shoulder  width apart on the object. Your left / right hand should be palm up, and your other hand should be palm down. Keep your elbow straight and your shoulder muscles relaxed. Push the object across your body toward your left / right side. Raise your left / right arm to the side of your body (abduction) until you feel a stretch in your shoulder. Do not raise your arm above shoulder height unless your health care provider tells you to do that. If directed, raise your arm over your head. Avoid shrugging your shoulder while you raise your arm. Keep your shoulder blade tucked down toward the middle of your back. Hold for __________ seconds. Slowly return to the starting position. Repeat __________ times. Complete this exercise __________ times a day. Internal rotation  Place your left / right hand behind your back, palm up. Use your other hand to dangle an exercise band, a towel, or a similar object over your shoulder. Grasp the band with your left / right hand so you are holding on to both ends. Gently pull up on the band until you feel a stretch in the front of your left / right shoulder. The movement of your arm toward the center of your body is called internal rotation. Avoid shrugging your shoulder while you raise your arm. Keep your shoulder blade tucked down toward the middle of your back. Hold for __________ seconds. Release the stretch by letting go of the band and lowering your hands. Repeat __________ times. Complete this exercise __________ times a day. Strengthening exercises External rotation  Sit in a stable chair without armrests. Secure an exercise band to a stable object at elbow height on your left / right side. Place a soft object, such as a folded towel or a small pillow, between your left / right upper arm and your body to move your elbow about 4 inches (10 cm) away from your side. Hold the end of the exercise band so it is tight and there is no slack. Keeping your elbow pressed  against the soft object, slowly move your forearm out, away from your abdomen (external rotation). Keep your body steady so only your forearm moves. Hold for __________ seconds. Slowly return to the starting position. Repeat __________ times. Complete this exercise __________ times a day. Shoulder abduction  Sit in a stable chair without armrests, or stand up. Hold a __________ weight in your left / right hand, or hold an exercise band with both hands. Start with your arms straight down and your left / right palm facing in, toward your body. Slowly lift your left / right hand out to your side (abduction). Do not lift your hand above shoulder height unless your health care provider tells you that this is safe. Keep your arms straight. Avoid shrugging your shoulder while you do this movement. Keep your shoulder blade tucked down toward the middle of your back. Hold for __________ seconds. Slowly lower your arm, and return to the starting position. Repeat __________ times. Complete this exercise __________ times a day. Shoulder extension Sit in a stable chair without armrests, or stand up. Secure an exercise band   to a stable object in front of you so it is at shoulder height. Hold one end of the exercise band in each hand. Your palms should face each other. Straighten your elbows and lift your hands up to shoulder height. Step back, away from the secured end of the exercise band, until the band is tight and there is no slack. Squeeze your shoulder blades together as you pull your hands down to the sides of your thighs (extension). Stop when your hands are straight down by your sides. Do not let your hands go behind your body. Hold for __________ seconds. Slowly return to the starting position. Repeat __________ times. Complete this exercise __________ times a day. Shoulder row Sit in a stable chair without armrests, or stand up. Secure an exercise band to a stable object in front of you so it  is at waist height. Hold one end of the exercise band in each hand. Position your palms so that your thumbs are facing the ceiling (neutral position). Bend each of your elbows to a 90-degree angle (right angle) and keep your upper arms at your sides. Step back until the band is tight and there is no slack. Slowly pull your elbows back behind you. Hold for __________ seconds. Slowly return to the starting position. Repeat __________ times. Complete this exercise __________ times a day. Shoulder press-ups  Sit in a stable chair that has armrests. Sit upright, with your feet flat on the floor. Put your hands on the armrests so your elbows are bent and your fingers are pointing forward. Your hands should be about even with the sides of your body. Push down on the armrests and use your arms to lift yourself off the chair. Straighten your elbows and lift yourself up as much as you comfortably can. Move your shoulder blades down, and avoid letting your shoulders move up toward your ears. Keep your feet on the ground. As you get stronger, your feet should support less of your body weight as you lift yourself up. Hold for __________ seconds. Slowly lower yourself back into the chair. Repeat __________ times. Complete this exercise __________ times a day. Wall push-ups  Stand so you are facing a stable wall. Your feet should be about one arm-length away from the wall. Lean forward and place your palms on the wall at shoulder height. Keep your feet flat on the floor as you bend your elbows and lean forward toward the wall. Hold for __________ seconds. Straighten your elbows to push yourself back to the starting position. Repeat __________ times. Complete this exercise __________ times a day. This information is not intended to replace advice given to you by your health care provider. Make sure you discuss any questions you have with your healthcare provider. Document Revised: 09/11/2018 Document  Reviewed: 06/19/2018 Elsevier Patient Education  2022 [employer] Cervical Strain and Sprain Rehab Ask your health care provider which exercises are safe for you. Do exercises exactly as told by your health care provider and adjust them as directed. It is normal to feel mild stretching, pulling, tightness, or discomfort as you do these exercises. Stop right away if you feel sudden pain or your pain gets worse. Do not begin these exercises until told by your health care provider. Stretching and range-of-motion exercises Cervical side bending  Using good posture, sit on a stable chair or stand up. Without moving your shoulders, slowly tilt your left / right ear to your shoulder until you feel a stretch in the opposite   side neck muscles. You should be looking straight ahead. Hold for __________ seconds. Repeat with the other side of your neck. Repeat __________ times. Complete this exercise __________ times a day. Cervical rotation  Using good posture, sit on a stable chair or stand up. Slowly turn your head to the side as if you are looking over your left / right shoulder. Keep your eyes level with the ground. Stop when you feel a stretch along the side and the back of your neck. Hold for __________ seconds. Repeat this by turning to your other side. Repeat __________ times. Complete this exercise __________ times a day. Thoracic extension and pectoral stretch Roll a towel or a small blanket so it is about 4 inches (10 cm) in diameter. Lie down on your back on a firm surface. Put the towel lengthwise, under your spine in the middle of your back. It should not be under your shoulder blades. The towel should line up with your spine from your middle back to your lower back. Put your hands behind your head and let your elbows fall out to your sides. Hold for __________ seconds. Repeat __________ times. Complete this exercise __________ times a day. Strengthening exercises Isometric upper  cervical flexion Lie on your back with a thin pillow behind your head and a small rolled-up towel under your neck. Gently tuck your chin toward your chest and nod your head down to look toward your feet. Do not lift your head off the pillow. Hold for __________ seconds. Release the tension slowly. Relax your neck muscles completely before you repeat this exercise. Repeat __________ times. Complete this exercise __________ times a day. Isometric cervical extension  Stand about 6 inches (15 cm) away from a wall, with your back facing the wall. Place a soft object, about 6-8 inches (15-20 cm) in diameter, between the back of your head and the wall. A soft object could be a small pillow, a ball, or a folded towel. Gently tilt your head back and press into the soft object. Keep your jaw and forehead relaxed. Hold for __________ seconds. Release the tension slowly. Relax your neck muscles completely before you repeat this exercise. Repeat __________ times. Complete this exercise __________ times a day. Posture and body mechanics Body mechanics refers to the movements and positions of your body while you do your daily activities. Posture is part of body mechanics. Good posture and healthy body mechanics can help to relieve stress in your body's tissues and joints. Good posture means that your spine is in its natural S-curve position (your spine is neutral), your shoulders are pulled back slightly, and your head is not tipped forward. The following are general guidelines for applying improved posture andbody mechanics to your everyday activities. Sitting  When sitting, keep your spine neutral and keep your feet flat on the floor. Use a footrest, if necessary, and keep your thighs parallel to the floor. Avoid rounding your shoulders, and avoid tilting your head forward. When working at a desk or a computer, keep your desk at a height where your hands are slightly lower than your elbows. Slide your chair  under your desk so you are close enough to maintain good posture. When working at a computer, place your monitor at a height where you are looking straight ahead and you do not have to tilt your head forward or downward to look at the screen.  Standing  When standing, keep your spine neutral and keep your feet about hip-width apart. Keep a   slight bend in your knees. Your ears, shoulders, and hips should line up. When you do a task in which you stand in one place for a long time, place one foot up on a stable object that is 2-4 inches (5-10 cm) high, such as a footstool. This helps keep your spine neutral.  Resting When lying down and resting, avoid positions that are most painful for you. Try to support your neck in a neutral position. You can use a contour pillow or asmall rolled-up towel. Your pillow should support your neck but not push on it. This information is not intended to replace advice given to you by your health care provider. Make sure you discuss any questions you have with your healthcare provider. Document Revised: 09/09/2018 Document Reviewed: 02/18/2018 Elsevier Patient Education  2022 [place].  Heart Disease Prevention   Your inflammatory disease increases your risk of heart disease which includes heart attack, stroke, atrial fibrillation (irregular heartbeats), high blood pressure, heart failure and atherosclerosis (plaque in the arteries).  It is important to reduce your risk by:   Keep blood pressure, cholesterol, and blood sugar at healthy levels   Smoking Cessation   Maintain a healthy weight  BMI 20-25   Eat a healthy diet  Plenty of fresh fruit, vegetables, and whole grains  Limit saturated fats, foods high in sodium, and added sugars  DASH and Mediterranean diet   Increase physical activity  Recommend moderate physically activity for 150 minutes per week/ 30 minutes a day for five days a week These can be broken up into three separate ten-minute  sessions during the day.   Reduce Stress  Meditation, slow breathing exercises, yoga, coloring books  Dental visits twice a year

## 2021-01-10 NOTE — Progress Notes (Signed)
Uric acid is 6.0.  The goal is to keep the uric acid below 6.0.  Please advise dietary modifications.  CMP is normal.  CBC is normal.

## 2021-02-08 DIAGNOSIS — Z125 Encounter for screening for malignant neoplasm of prostate: Secondary | ICD-10-CM | POA: Diagnosis not present

## 2021-02-08 DIAGNOSIS — I10 Essential (primary) hypertension: Secondary | ICD-10-CM | POA: Diagnosis not present

## 2021-02-14 ENCOUNTER — Telehealth: Payer: Self-pay

## 2021-02-14 ENCOUNTER — Other Ambulatory Visit: Payer: Self-pay | Admitting: *Deleted

## 2021-02-14 DIAGNOSIS — G4700 Insomnia, unspecified: Secondary | ICD-10-CM | POA: Diagnosis not present

## 2021-02-14 DIAGNOSIS — J3089 Other allergic rhinitis: Secondary | ICD-10-CM

## 2021-02-14 DIAGNOSIS — Z0001 Encounter for general adult medical examination with abnormal findings: Secondary | ICD-10-CM | POA: Diagnosis not present

## 2021-02-14 DIAGNOSIS — I10 Essential (primary) hypertension: Secondary | ICD-10-CM | POA: Diagnosis not present

## 2021-02-14 DIAGNOSIS — G5600 Carpal tunnel syndrome, unspecified upper limb: Secondary | ICD-10-CM | POA: Diagnosis not present

## 2021-02-14 DIAGNOSIS — G248 Other dystonia: Secondary | ICD-10-CM | POA: Diagnosis not present

## 2021-02-14 DIAGNOSIS — E782 Mixed hyperlipidemia: Secondary | ICD-10-CM | POA: Diagnosis not present

## 2021-02-14 DIAGNOSIS — E875 Hyperkalemia: Secondary | ICD-10-CM | POA: Diagnosis not present

## 2021-02-14 DIAGNOSIS — J309 Allergic rhinitis, unspecified: Secondary | ICD-10-CM | POA: Diagnosis not present

## 2021-02-14 DIAGNOSIS — J302 Other seasonal allergic rhinitis: Secondary | ICD-10-CM

## 2021-02-14 DIAGNOSIS — E790 Hyperuricemia without signs of inflammatory arthritis and tophaceous disease: Secondary | ICD-10-CM | POA: Diagnosis not present

## 2021-02-14 DIAGNOSIS — R7301 Impaired fasting glucose: Secondary | ICD-10-CM | POA: Diagnosis not present

## 2021-02-14 DIAGNOSIS — K219 Gastro-esophageal reflux disease without esophagitis: Secondary | ICD-10-CM | POA: Diagnosis not present

## 2021-02-14 MED ORDER — EPINEPHRINE 0.3 MG/0.3ML IJ SOAJ
0.3000 mg | Freq: Once | INTRAMUSCULAR | 1 refills | Status: AC
Start: 2021-02-14 — End: 2021-02-14

## 2021-02-14 NOTE — Telephone Encounter (Signed)
Patient is starting allergy injections on 03/09/2021 and will need a epi pen sent in to [facility]. Thanks

## 2021-02-14 NOTE — Telephone Encounter (Signed)
EpiPen has been sent in for the patient.

## 2021-02-15 DIAGNOSIS — J3081 Allergic rhinitis due to animal (cat) (dog) hair and dander: Secondary | ICD-10-CM | POA: Diagnosis not present

## 2021-02-15 NOTE — Progress Notes (Signed)
VIALS MADE. EXP 02-15-22 

## 2021-02-15 NOTE — Progress Notes (Signed)
Aeroallergen Immunotherapy   Ordering Provider: Dr. Salvatore Marvel   Patient Details  Name: Dennis Miles  MRN: [id number]  Date of Birth: December 15, 1950   Order 2 of 2   Vial Label: RW/Molds/CR   0.3 ml (Volume)  1:20 Concentration -- Ragweed Mix  0.2 ml (Volume)  1:20 Concentration -- Alternaria alternata  0.2 ml (Volume)  1:20 Concentration -- Cladosporium herbarum  0.2 ml (Volume)  1:10 Concentration -- Aspergillus mix  0.2 ml (Volume)  1:10 Concentration -- Penicillium mix  0.2 ml (Volume)  1:20 Concentration -- Bipolaris sorokiniana  0.2 ml (Volume)  1:20 Concentration -- Drechslera spicifera  0.2 ml (Volume)  1:10 Concentration -- Mucor plumbeus  0.2 ml (Volume)  1:10 Concentration -- Fusarium moniliforme  0.2 ml (Volume)  1:40 Concentration -- Aureobasidium pullulans  0.2 ml (Volume)  1:10 Concentration -- Rhizopus oryzae  0.3 ml (Volume)  1:20 Concentration -- Cockroach, German  0.5 ml (Volume)   AU Concentration -- Mite Mix (DF 5,000 & DP 5,000)    3.1  ml Extract Subtotal  1.9  ml Diluent  5.0  ml Maintenance Total   Schedule:  A  Silver Vial (1:1,000,000): Schedule A (10 doses)  Blue Vial (1:100,000): Schedule A (10 doses)  Yellow Vial (1:10,000): Schedule A (10 doses)  Green Vial (1:1,000): Schedule A (10 doses)  Red Vial (1:100): Schedule A (10 doses)   Special Instructions: none

## 2021-02-15 NOTE — Progress Notes (Signed)
Aeroallergen Immunotherapy   Ordering Provider: Dr. Salvatore Marvel   Patient Details  Name: Dennis Miles  MRN: [id number]  Date of Birth: [birth year]   Order 1 of 2   Vial Label: G/W/T/C/D   0.3 ml (Volume)  BAU Concentration -- 7 Grass Mix* 100,000 ([id number] [place], [place], [place], [place], [place], Sweet Vernal, [place])  0.5 ml (Volume)  1:20 Concentration -- Weed Mix*  0.5 ml (Volume)  1:20 Concentration -- Eastern 10 Tree Mix (also Sweet Gum)  0.2 ml (Volume)  1:10 Concentration -- Hickory  0.2 ml (Volume)  1:10 Concentration -- Pecan Pollen  0.5 ml (Volume)  1:10 Concentration -- Cat Hair  0.5 ml (Volume)  1:10 Concentration -- Dog Epithelia    2.7  ml Extract Subtotal  2.3  ml Diluent  5.0  ml Maintenance Total   Schedule:  A   Blue Vial (1:100,000): Schedule A (10 doses)  Yellow Vial (1:10,000): Schedule A (10 doses)  Green Vial (1:1,000): Schedule A (10 doses)  Red Vial (1:100): Schedule A (10 doses)   Special Instructions: none

## 2021-02-16 DIAGNOSIS — J302 Other seasonal allergic rhinitis: Secondary | ICD-10-CM | POA: Diagnosis not present

## 2021-03-09 ENCOUNTER — Ambulatory Visit (INDEPENDENT_AMBULATORY_CARE_PROVIDER_SITE_OTHER): Payer: Medicare Other

## 2021-03-09 ENCOUNTER — Other Ambulatory Visit: Payer: Self-pay

## 2021-03-09 DIAGNOSIS — J309 Allergic rhinitis, unspecified: Secondary | ICD-10-CM

## 2021-03-09 NOTE — Progress Notes (Signed)
Immunotherapy   Patient Details  Name: Dennis Miles MRN: [id number] Date of Birth: 07-18-1950  03/09/2021  [name] started injections for G-W-T-C-D and RW-Molds-Cr. Patient received 0.05 of both his blue vials with an expiration of 02/15/2022. Patient waited in the office for 30 minutes with no problems. Following schedule: A  Frequency:2 times per week Epi-Pen:Epi-Pen Available  Consent signed and patient instructions given.   [name] 03/09/2021, 8:48 AM

## 2021-03-14 ENCOUNTER — Ambulatory Visit (INDEPENDENT_AMBULATORY_CARE_PROVIDER_SITE_OTHER): Payer: Medicare Other

## 2021-03-14 DIAGNOSIS — J309 Allergic rhinitis, unspecified: Secondary | ICD-10-CM | POA: Diagnosis not present

## 2021-03-16 ENCOUNTER — Ambulatory Visit (INDEPENDENT_AMBULATORY_CARE_PROVIDER_SITE_OTHER): Payer: Medicare Other

## 2021-03-16 DIAGNOSIS — J309 Allergic rhinitis, unspecified: Secondary | ICD-10-CM | POA: Diagnosis not present

## 2021-03-21 ENCOUNTER — Ambulatory Visit (INDEPENDENT_AMBULATORY_CARE_PROVIDER_SITE_OTHER): Payer: Medicare Other

## 2021-03-21 DIAGNOSIS — J309 Allergic rhinitis, unspecified: Secondary | ICD-10-CM

## 2021-03-23 ENCOUNTER — Ambulatory Visit (INDEPENDENT_AMBULATORY_CARE_PROVIDER_SITE_OTHER): Payer: Medicare Other

## 2021-03-23 DIAGNOSIS — J309 Allergic rhinitis, unspecified: Secondary | ICD-10-CM | POA: Diagnosis not present

## 2021-03-26 DIAGNOSIS — Z23 Encounter for immunization: Secondary | ICD-10-CM | POA: Diagnosis not present

## 2021-03-30 ENCOUNTER — Ambulatory Visit (INDEPENDENT_AMBULATORY_CARE_PROVIDER_SITE_OTHER): Payer: Medicare Other

## 2021-03-30 DIAGNOSIS — J309 Allergic rhinitis, unspecified: Secondary | ICD-10-CM

## 2021-04-04 ENCOUNTER — Ambulatory Visit (INDEPENDENT_AMBULATORY_CARE_PROVIDER_SITE_OTHER): Payer: Medicare Other

## 2021-04-04 DIAGNOSIS — J309 Allergic rhinitis, unspecified: Secondary | ICD-10-CM | POA: Diagnosis not present

## 2021-04-11 ENCOUNTER — Ambulatory Visit (INDEPENDENT_AMBULATORY_CARE_PROVIDER_SITE_OTHER): Payer: Medicare Other

## 2021-04-11 DIAGNOSIS — J309 Allergic rhinitis, unspecified: Secondary | ICD-10-CM

## 2021-04-13 DIAGNOSIS — I10 Essential (primary) hypertension: Secondary | ICD-10-CM | POA: Diagnosis not present

## 2021-04-13 DIAGNOSIS — R0600 Dyspnea, unspecified: Secondary | ICD-10-CM | POA: Diagnosis not present

## 2021-04-13 DIAGNOSIS — K219 Gastro-esophageal reflux disease without esophagitis: Secondary | ICD-10-CM | POA: Diagnosis not present

## 2021-04-13 DIAGNOSIS — E782 Mixed hyperlipidemia: Secondary | ICD-10-CM | POA: Diagnosis not present

## 2021-04-18 ENCOUNTER — Ambulatory Visit (INDEPENDENT_AMBULATORY_CARE_PROVIDER_SITE_OTHER): Payer: Medicare Other

## 2021-04-18 ENCOUNTER — Encounter (INDEPENDENT_AMBULATORY_CARE_PROVIDER_SITE_OTHER): Payer: Self-pay | Admitting: *Deleted

## 2021-04-18 DIAGNOSIS — J309 Allergic rhinitis, unspecified: Secondary | ICD-10-CM | POA: Diagnosis not present

## 2021-04-25 ENCOUNTER — Ambulatory Visit (INDEPENDENT_AMBULATORY_CARE_PROVIDER_SITE_OTHER): Payer: Medicare Other | Admitting: *Deleted

## 2021-04-25 DIAGNOSIS — J309 Allergic rhinitis, unspecified: Secondary | ICD-10-CM | POA: Diagnosis not present

## 2021-05-02 ENCOUNTER — Ambulatory Visit (INDEPENDENT_AMBULATORY_CARE_PROVIDER_SITE_OTHER): Payer: Medicare Other

## 2021-05-02 DIAGNOSIS — J309 Allergic rhinitis, unspecified: Secondary | ICD-10-CM

## 2021-05-04 ENCOUNTER — Ambulatory Visit (INDEPENDENT_AMBULATORY_CARE_PROVIDER_SITE_OTHER): Payer: Medicare Other

## 2021-05-04 DIAGNOSIS — J309 Allergic rhinitis, unspecified: Secondary | ICD-10-CM | POA: Diagnosis not present

## 2021-05-09 ENCOUNTER — Ambulatory Visit (INDEPENDENT_AMBULATORY_CARE_PROVIDER_SITE_OTHER): Payer: Medicare Other

## 2021-05-09 DIAGNOSIS — J309 Allergic rhinitis, unspecified: Secondary | ICD-10-CM

## 2021-05-11 ENCOUNTER — Ambulatory Visit (INDEPENDENT_AMBULATORY_CARE_PROVIDER_SITE_OTHER): Payer: Medicare Other

## 2021-05-11 DIAGNOSIS — J309 Allergic rhinitis, unspecified: Secondary | ICD-10-CM

## 2021-05-14 ENCOUNTER — Other Ambulatory Visit (INDEPENDENT_AMBULATORY_CARE_PROVIDER_SITE_OTHER): Payer: Self-pay

## 2021-05-14 ENCOUNTER — Ambulatory Visit (INDEPENDENT_AMBULATORY_CARE_PROVIDER_SITE_OTHER): Payer: Medicare Other | Admitting: Gastroenterology

## 2021-05-14 ENCOUNTER — Encounter (INDEPENDENT_AMBULATORY_CARE_PROVIDER_SITE_OTHER): Payer: Self-pay

## 2021-05-14 ENCOUNTER — Encounter (INDEPENDENT_AMBULATORY_CARE_PROVIDER_SITE_OTHER): Payer: Self-pay | Admitting: Gastroenterology

## 2021-05-14 ENCOUNTER — Other Ambulatory Visit: Payer: Self-pay

## 2021-05-14 VITALS — BP 128/80 | HR 76 | Temp 97.9°F | Ht 70.0 in | Wt 165.3 lb

## 2021-05-14 DIAGNOSIS — R1013 Epigastric pain: Secondary | ICD-10-CM | POA: Insufficient documentation

## 2021-05-14 DIAGNOSIS — G8929 Other chronic pain: Secondary | ICD-10-CM

## 2021-05-14 DIAGNOSIS — K219 Gastro-esophageal reflux disease without esophagitis: Secondary | ICD-10-CM | POA: Diagnosis not present

## 2021-05-14 DIAGNOSIS — R1319 Other dysphagia: Secondary | ICD-10-CM

## 2021-05-14 DIAGNOSIS — R1310 Dysphagia, unspecified: Secondary | ICD-10-CM

## 2021-05-14 MED ORDER — PANTOPRAZOLE SODIUM 40 MG PO TBEC: 40.0000 mg | DELAYED_RELEASE_TABLET | Freq: Every day | ORAL | 2 refills | Status: DC

## 2021-05-14 NOTE — Progress Notes (Addendum)
Referring Provider: Celene Squibb, MD Primary Care Physician:  Celene Squibb, MD Primary GI Physician: Jenetta Downer  Chief Complaint  Patient presents with   Gastroesophageal Reflux    Having a lot of pain in center of chest, having belching at times and it relieves pain for a while. Taking protonix daily and last med prescribed by dr hall, carafate.    HPI:   Dennis Miles is a 70 y.o. male with past medical history of GERD, HLD, anxiety, HTN, gout, DJD, elevated cholesterol.  Patient presenting today for epigastric pain/pressure.  Patient last seen in clinic June 2022, he is maintained on protonix 20mg  daily. He presents today for ongoing epigastric pain/pressure that radiates up into his chest/throat for the past few months.  He denies any acid regurgitation or heartburn, however, He reports that he has a lot of belching and gas. When belching, this relieves some of the pressure he is having. He denies any sensation of spasms in his esophagus. Sometimes he has issues with large pills getting stuck near the sternal notch, but can usually get them to pass with drinking liquids, however, he is not having many episodes of dysphagia with foods. Eating does not seem to worsen or relieve the pain he has. He states epigastric pain is present everyday, no radiation of pain. He was started on carafate TID by PCP, with minimal relief in his symptoms. He does not take any NSAIDs on a regular basis and reports no current alcohol use. He denies postprandial abdominal pain, early satiety, weight loss, changes in appetite, blood in stools, melena, constipation or diarrhea. He has had no episodes of nausea or vomiting. Denies sore throat, cough or hoarseness. Notably his last EGD in 2012 was suggestive of EOE, however, biopsies were negative.   Last Endoscopy:10/19/12 no evidence of erosive esophagitis, distal esoph. Stricture dilated by passing 54 [place] maloney dilator. Mucosal changes at esophageal body are  suggestive of EOE, bx negative.  Last Colonoscopy : 04/01/11 with removal of 3 mm tubular adenoma  Recommendations:  Repeat colonoscopy 2024  Past Medical History:  Diagnosis Date   Anxiety    GERD (gastroesophageal reflux disease)    Gout    Headache(784.0)    Hiatal hernia    High cholesterol    Hypertension    Skin cancer     Past Surgical History:  Procedure Laterality Date   APPENDECTOMY     COLONOSCOPY N/A 10/23/2012   Procedure: COLONOSCOPY;  Surgeon: Rogene Houston, MD;  Location: [facility];  Service: Endoscopy;  Laterality: N/A;  235   ESOPHAGOGASTRODUODENOSCOPY  04/17/2011   Procedure: ESOPHAGOGASTRODUODENOSCOPY (EGD);  Surgeon: Rogene Houston, MD;  Location: [facility];  Service: Endoscopy;  Laterality: N/A;  8:30   HAND SURGERY  05/28/2016   INGUINAL HERNIA REPAIR Left 02/17/2013   Procedure: HERNIA REPAIR INGUINAL ADULT;  Surgeon: Jamesetta So, MD;  Location: [facility];  Service: General;  Laterality: Left;   INSERTION OF MESH Left 02/17/2013   Procedure: INSERTION OF MESH;  Surgeon: Jamesetta So, MD;  Location: [facility];  Service: General;  Laterality: Left;   SINOSCOPY     sinus sugery     SKIN CANCER EXCISION     x4    Current Outpatient Medications  Medication Sig Dispense Refill   allopurinol (ZYLOPRIM) 100 MG tablet Take 2 tablets (200 mg total) by mouth daily. 180 tablet 0   cetirizine (ZYRTEC) 10 MG tablet Take 10 mg by mouth  daily.     cyclobenzaprine (FLEXERIL) 5 MG tablet      ezetimibe (ZETIA) 10 MG tablet ezetimibe 10 mg tablet  TAKE 1 TABLET BY MOUTH ONCE DAILY     fluticasone (FLONASE) 50 MCG/ACT nasal spray      levocetirizine (XYZAL) 5 MG tablet Take 5 mg by mouth daily.     lisinopril (PRINIVIL,ZESTRIL) 10 MG tablet Take 10 mg by mouth daily.     pantoprazole (PROTONIX) 20 MG tablet pantoprazole 20 mg tablet,delayed release  Take 2 tablets every day by oral route.     PRESCRIPTION MEDICATION Allergy injections     zolpidem  (AMBIEN) 10 MG tablet Take 10 mg by mouth at bedtime. For sleep     No current facility-administered medications for this visit.    Allergies as of 05/14/2021 - Review Complete 05/14/2021  Allergen Reaction Noted   Codeine Nausea Only 04/01/2011    Family History  Problem Relation Age of Onset   Aneurysm Father    Asthma Child    Colon cancer Neg Hx     Social History   Socioeconomic History   Marital status: Divorced    Spouse name: Not on file   Number of children: Not on file   Years of education: Not on file   Highest education level: Not on file  Occupational History   Not on file  Tobacco Use   Smoking status: Never   Smokeless tobacco: Former    Types: Chew    Quit date: 06/03/2012  Vaping Use   Vaping Use: Never used  Substance and Sexual Activity   Alcohol use: Yes    Alcohol/week: 1.0 standard drink    Types: 1 Cans of beer per week    Comment: occ   Drug use: Never   Sexual activity: Not on file  Other Topics Concern   Not on file  Social History Narrative   Lives at home with daughter.  Retired.  Education 12th grade.  Children 2.     Social Determinants of Health   Financial Resource Strain: Not on file  Food Insecurity: Not on file  Transportation Needs: Not on file  Physical Activity: Not on file  Stress: Not on file  Social Connections: Not on file   Review of systems General: negative for malaise, night sweats, fever, chills, weight loss Neck: Negative for lumps, goiter, pain and significant neck swelling Resp: Negative for cough, wheezing, dyspnea at rest CV: Negative for chest pain, leg swelling, palpitations, orthopnea GI: denies melena, hematochezia, nausea, vomiting, diarrhea, constipation,odyonophagia, early satiety or unintentional weight loss. +dysphagia +epigastric pain MSK: Negative for joint pain or swelling, back pain, and muscle pain. Derm: Negative for itching or rash Psych: Denies depression, anxiety, memory loss, confusion.  No homicidal or suicidal ideation.  Heme: Negative for prolonged bleeding, bruising easily, and swollen nodes. Endocrine: Negative for cold or heat intolerance, polyuria, polydipsia and goiter. Neuro: negative for tremor, gait imbalance, syncope and seizures. The remainder of the review of systems is noncontributory.  Physical Exam: BP 128/80 (BP Location: Right Arm, Patient Position: Sitting, Cuff Size: Large)   Pulse 76   Temp 97.9 F (36.6 C) (Oral)   Ht 5\' 10"  (1.778 m)   Wt 165 lb 4.8 oz (75 kg)   BMI 23.72 kg/m  General:   Alert and oriented. No distress noted. Pleasant and cooperative.  Head:  Normocephalic and atraumatic. Eyes:  Conjuctiva clear without scleral icterus. Mouth:  Oral mucosa pink and moist. Good  dentition. No lesions. Heart: Normal rate and rhythm, s1 and s2 heart sounds present.  Lungs: Clear lung sounds in all lobes. Respirations equal and unlabored. Abdomen:  +BS, soft, and non-distended. TTP of LUQ. No rebound or guarding. No HSM or masses noted. Derm: No palmar erythema or jaundice Msk:  Symmetrical without gross deformities. Normal posture. Extremities:  Without edema. Neurologic:  Alert and  oriented x4 Psych:  Alert and cooperative. Normal mood and affect.  Invalid input(s): 6 MONTHS   ASSESSMENT: Dennis Miles is a 70 y.o. male presenting today with epigastric pain.  Epigastric pain present for the past few months without precipitating or relieving factors, currently maintained on pantoprazole 20mg  daily, denies any acid reflux or regurgitation, sore throat, cough or hoarseness. PCP started him on carafate tablet QID, though he has had minimal relief of symptoms with this. Last EGD in 2012 suspicious for EOE, however, bx were negative. He continues to have some intermittent dysphagia with large pills, but rare occurrences of dysphagia with foods. He denies sensation of esophageal spasms. We will increase pantoprazole to 40mg  once daily at this time  and get him scheduled for EGD +/-dilation for further evaluation of his symptoms as we cannot rule out PUD, EOE, or esophagitis. He should continue to avoid NSAIDs and alcohol. Reassuringly he has no postprandial abdominal pain, melena or rectal bleeding, weight is stable and appetite has not changed, low suspicion for malignant etiology at this time.    PLAN:  Scheduled EGD +/- dilation 2. Increase pantoprazole to 40mg  daily, further recommendations to follow EGD 3. Avoid NSAIDs/alcohol  4. Can d/c carafate if no symptom relief provided  Follow Up: TBD after EGD  Nikolaos Maddocks L. Alver Sorrow, MSN, APRN, AGNP-C Adult-Gerontology Nurse Practitioner [facility]

## 2021-05-14 NOTE — Patient Instructions (Signed)
It was very nice to meet you! We will get you scheduled for EGD for further evaluation of your symptoms.  I am increasing your pantoprazole dose to 40mg  once daily, please take this in the mornings 30-45 minutes prior to breakfast. You can continue with the carafate prescribed by Dr. Nevada Crane if you feel that this is providing some relief of your symptoms, if it is not, you can discontinue it.   Please continue to  avoid NSAIDs (advil, aleve, naproxen, goody powder, ibuprofen) as these can be very hard on your GI tract, causing inflammation, ulcers and damage to the lining of your GI tract.

## 2021-05-15 ENCOUNTER — Encounter (INDEPENDENT_AMBULATORY_CARE_PROVIDER_SITE_OTHER): Payer: Self-pay

## 2021-05-15 DIAGNOSIS — D2271 Melanocytic nevi of right lower limb, including hip: Secondary | ICD-10-CM | POA: Diagnosis not present

## 2021-05-15 DIAGNOSIS — L570 Actinic keratosis: Secondary | ICD-10-CM | POA: Diagnosis not present

## 2021-05-15 DIAGNOSIS — L814 Other melanin hyperpigmentation: Secondary | ICD-10-CM | POA: Diagnosis not present

## 2021-05-15 DIAGNOSIS — D485 Neoplasm of uncertain behavior of skin: Secondary | ICD-10-CM | POA: Diagnosis not present

## 2021-05-15 DIAGNOSIS — Z85828 Personal history of other malignant neoplasm of skin: Secondary | ICD-10-CM | POA: Diagnosis not present

## 2021-05-15 DIAGNOSIS — D225 Melanocytic nevi of trunk: Secondary | ICD-10-CM | POA: Diagnosis not present

## 2021-05-15 DIAGNOSIS — L578 Other skin changes due to chronic exposure to nonionizing radiation: Secondary | ICD-10-CM | POA: Diagnosis not present

## 2021-05-15 DIAGNOSIS — L821 Other seborrheic keratosis: Secondary | ICD-10-CM | POA: Diagnosis not present

## 2021-05-16 ENCOUNTER — Ambulatory Visit (INDEPENDENT_AMBULATORY_CARE_PROVIDER_SITE_OTHER): Payer: Medicare Other | Admitting: *Deleted

## 2021-05-16 DIAGNOSIS — J309 Allergic rhinitis, unspecified: Secondary | ICD-10-CM

## 2021-05-23 ENCOUNTER — Ambulatory Visit (INDEPENDENT_AMBULATORY_CARE_PROVIDER_SITE_OTHER): Payer: Medicare Other

## 2021-05-23 DIAGNOSIS — J309 Allergic rhinitis, unspecified: Secondary | ICD-10-CM

## 2021-05-24 NOTE — Patient Instructions (Signed)
Dennis Miles  05/24/2021     @PREFPERIOPPHARMACY @   Your procedure is scheduled on  05/30/2021.   Report to [facility] at  [phone] A.M.   Call this number if you have problems the morning of surgery:  [phone]   Remember:  Follow the diet instructions given to you by the office.    Take these medicines the morning of surgery with A SIP OF WATER                  allopurinol, flexeril(if needed), xyzal, protonix.    Do not wear jewelry, make-up or nail polish.  Do not wear lotions, powders, or perfumes, or deodorant.  Do not shave 48 hours prior to surgery.  Men may shave face and neck.  Do not bring valuables to the hospital.  [facility] is not responsible for any belongings or valuables.  Contacts, dentures or bridgework may not be worn into surgery.  Leave your suitcase in the car.  After surgery it may be brought to your room.  For patients admitted to the hospital, discharge time will be determined by your treatment team.  Patients discharged the day of surgery will not be allowed to drive home and must have someone with them for 24 hours.    Special instructions:   DO NOT smoke tobacco or vape for 24 hours before your procedure.  Please read over the following fact sheets that you were given. Anesthesia Post-op Instructions and Care and Recovery After Surgery      Upper Endoscopy, Adult, Care After This sheet gives you information about how to care for yourself after your procedure. Your health care provider may also give you more specific instructions. If you have problems or questions, contact your health care provider. What can I expect after the procedure? After the procedure, it is common to have: A sore throat. Mild stomach pain or discomfort. Bloating. Nausea. Follow these instructions at home:  Follow instructions from your health care provider about what to eat or drink after your procedure. Return to your normal activities as told by  your health care provider. Ask your health care provider what activities are safe for you. Take over-the-counter and prescription medicines only as told by your health care provider. If you were given a sedative during the procedure, it can affect you for several hours. Do not drive or operate machinery until your health care provider says that it is safe. Keep all follow-up visits as told by your health care provider. This is important. Contact a health care provider if you have: A sore throat that lasts longer than one day. Trouble swallowing. Get help right away if: You vomit blood or your vomit looks like coffee grounds. You have: A fever. Bloody, black, or tarry stools. A severe sore throat or you cannot swallow. Difficulty breathing. Severe pain in your chest or abdomen. Summary After the procedure, it is common to have a sore throat, mild stomach discomfort, bloating, and nausea. If you were given a sedative during the procedure, it can affect you for several hours. Do not drive or operate machinery until your health care provider says that it is safe. Follow instructions from your health care provider about what to eat or drink after your procedure. Return to your normal activities as told by your health care provider. This information is not intended to replace advice given to you by your health care provider. Make sure you discuss any  questions you have with your health care provider. Document Revised: 03/26/2019 Document Reviewed: 10/20/2017 Elsevier Patient Education  2022 [place]. Esophageal Dilatation Esophageal dilatation, also called esophageal dilation, is a procedure to widen or open a blocked or narrowed part of the esophagus. The esophagus is the part of the body that moves food and liquid from the mouth to the stomach. You may need this procedure if: You have a buildup of scar tissue in your esophagus that makes it difficult, painful, or impossible to swallow. This  can be caused by gastroesophageal reflux disease (GERD). You have cancer of the esophagus. There is a problem with how food moves through your esophagus. In some cases, you may need this procedure repeated at a later time to dilate the esophagus gradually. Tell a health care provider about: Any allergies you have. All medicines you are taking, including vitamins, herbs, eye drops, creams, and over-the-counter medicines. Any problems you or family members have had with anesthetic medicines. Any blood disorders you have. Any surgeries you have had. Any medical conditions you have. Any antibiotic medicines you are required to take before dental procedures. Whether you are pregnant or may be pregnant. What are the risks? Generally, this is a safe procedure. However, problems may occur, including: Bleeding due to a tear in the lining of the esophagus. A hole, or perforation, in the esophagus. What happens before the procedure? Ask your health care provider about: Changing or stopping your regular medicines. This is especially important if you are taking diabetes medicines or blood thinners. Taking medicines such as aspirin and ibuprofen. These medicines can thin your blood. Do not take these medicines unless your health care provider tells you to take them. Taking over-the-counter medicines, vitamins, herbs, and supplements. Follow instructions from your health care provider about eating or drinking restrictions. Plan to have a responsible adult take you home from the hospital or clinic. Plan to have a responsible adult care for you for the time you are told after you leave the hospital or clinic. This is important. What happens during the procedure? You may be given a medicine to help you relax (sedative). A numbing medicine may be sprayed into the back of your throat, or you may gargle the medicine. Your health care provider may perform the dilatation using various surgical instruments, such  as: Simple dilators. This instrument is carefully placed in the esophagus to stretch it. Guided wire bougies. This involves using an endoscope to insert a wire into the esophagus. A dilator is passed over this wire to enlarge the esophagus. Then the wire is removed. Balloon dilators. An endoscope with a small balloon is inserted into the esophagus. The balloon is inflated to stretch the esophagus and open it up. The procedure may vary among health care providers and hospitals. What can I expect after the procedure? Your blood pressure, heart rate, breathing rate, and blood oxygen level will be monitored until you leave the hospital or clinic. Your throat may feel slightly sore and numb. This will get better over time. You will not be allowed to eat or drink until your throat is no longer numb. When you are able to drink, urinate, and sit on the edge of the bed without nausea or dizziness, you may be able to return home. Follow these instructions at home: Take over-the-counter and prescription medicines only as told by your health care provider. If you were given a sedative during the procedure, it can affect you for several hours. Do not drive  or operate machinery until your health care provider says that it is safe. Plan to have a responsible adult care for you for the time you are told. This is important. Follow instructions from your health care provider about any eating or drinking restrictions. Do not use any products that contain nicotine or tobacco, such as cigarettes, e-cigarettes, and chewing tobacco. If you need help quitting, ask your health care provider. Keep all follow-up visits. This is important. Contact a health care provider if: You have a fever. You have pain that is not relieved by medicine. Get help right away if: You have chest pain. You have trouble breathing. You have trouble swallowing. You vomit blood. You have black, tarry, or bloody stools. These symptoms may  represent a serious problem that is an emergency. Do not wait to see if the symptoms will go away. Get medical help right away. Call your local emergency services (911 in the [place] Do not drive yourself to the hospital. Summary Esophageal dilatation, also called esophageal dilation, is a procedure to widen or open a blocked or narrowed part of the esophagus. Plan to have a responsible adult take you home from the hospital or clinic. For this procedure, a numbing medicine may be sprayed into the back of your throat, or you may gargle the medicine. Do not drive or operate machinery until your health care provider says that it is safe. This information is not intended to replace advice given to you by your health care provider. Make sure you discuss any questions you have with your health care provider. Document Revised: 10/06/2019 Document Reviewed: 10/06/2019 Elsevier Patient Education  [place] After This sheet gives you information about how to care for yourself after your procedure. Your health care provider may also give you more specific instructions. If you have problems or questions, contact your health care provider. What can I expect after the procedure? After the procedure, it is common to have: Tiredness. Forgetfulness about what happened after the procedure. Impaired judgment for important decisions. Nausea or vomiting. Some difficulty with balance. Follow these instructions at home: For the time period you were told by your health care provider:   Rest as needed. Do not participate in activities where you could fall or become injured. Do not drive or use machinery. Do not drink alcohol. Do not take sleeping pills or medicines that cause drowsiness. Do not make important decisions or sign legal documents. Do not take care of children on your own. Eating and drinking Follow the diet that is recommended by your health care  provider. Drink enough fluid to keep your urine pale yellow. If you vomit: Drink water, juice, or soup when you can drink without vomiting. Make sure you have little or no nausea before eating solid foods. General instructions Have a responsible adult stay with you for the time you are told. It is important to have someone help care for you until you are awake and alert. Take over-the-counter and prescription medicines only as told by your health care provider. If you have sleep apnea, surgery and certain medicines can increase your risk for breathing problems. Follow instructions from your health care provider about wearing your sleep device: Anytime you are sleeping, including during daytime naps. While taking prescription pain medicines, sleeping medicines, or medicines that make you drowsy. Avoid smoking. Keep all follow-up visits as told by your health care provider. This is important. Contact a health care provider if: You keep feeling nauseous  or you keep vomiting. You feel light-headed. You are still sleepy or having trouble with balance after 24 hours. You develop a rash. You have a fever. You have redness or swelling around the IV site. Get help right away if: You have trouble breathing. You have new-onset confusion at home. Summary For several hours after your procedure, you may feel tired. You may also be forgetful and have poor judgment. Have a responsible adult stay with you for the time you are told. It is important to have someone help care for you until you are awake and alert. Rest as told. Do not drive or operate machinery. Do not drink alcohol or take sleeping pills. Get help right away if you have trouble breathing, or if you suddenly become confused. This information is not intended to replace advice given to you by your health care provider. Make sure you discuss any questions you have with your health care provider. Document Revised: 02/03/2020 Document Reviewed:  04/22/2019 Elsevier Patient Education  2022 [employer].

## 2021-05-29 ENCOUNTER — Other Ambulatory Visit: Payer: Self-pay

## 2021-05-29 ENCOUNTER — Encounter (HOSPITAL_COMMUNITY)
Admission: RE | Admit: 2021-05-29 | Discharge: 2021-05-29 | Disposition: A | Discharge status: Home / Self Care | Payer: Medicare Other | Source: Ambulatory Visit | Attending: Internal Medicine | Admitting: Internal Medicine

## 2021-05-29 DIAGNOSIS — G8929 Other chronic pain: Secondary | ICD-10-CM

## 2021-05-29 DIAGNOSIS — Z01810 Encounter for preprocedural cardiovascular examination: Secondary | ICD-10-CM | POA: Insufficient documentation

## 2021-05-29 DIAGNOSIS — R1310 Dysphagia, unspecified: Secondary | ICD-10-CM

## 2021-05-30 ENCOUNTER — Ambulatory Visit (HOSPITAL_COMMUNITY)
Admission: RE | Admit: 2021-05-30 | Discharge: 2021-05-30 | Disposition: A | Discharge status: Home / Self Care | Payer: Medicare Other | Source: Ambulatory Visit | Attending: Internal Medicine | Admitting: Internal Medicine

## 2021-05-30 ENCOUNTER — Encounter (HOSPITAL_COMMUNITY)
Admission: RE | Disposition: A | Discharge status: Home / Self Care | Payer: Self-pay | Source: Ambulatory Visit | Attending: Internal Medicine

## 2021-05-30 ENCOUNTER — Ambulatory Visit (HOSPITAL_COMMUNITY): Payer: Medicare Other | Admitting: Anesthesiology

## 2021-05-30 ENCOUNTER — Encounter (HOSPITAL_COMMUNITY): Payer: Self-pay | Admitting: Internal Medicine

## 2021-05-30 DIAGNOSIS — K449 Diaphragmatic hernia without obstruction or gangrene: Secondary | ICD-10-CM | POA: Diagnosis not present

## 2021-05-30 DIAGNOSIS — R1314 Dysphagia, pharyngoesophageal phase: Secondary | ICD-10-CM | POA: Insufficient documentation

## 2021-05-30 DIAGNOSIS — K317 Polyp of stomach and duodenum: Secondary | ICD-10-CM | POA: Insufficient documentation

## 2021-05-30 DIAGNOSIS — K222 Esophageal obstruction: Secondary | ICD-10-CM | POA: Insufficient documentation

## 2021-05-30 DIAGNOSIS — R1310 Dysphagia, unspecified: Secondary | ICD-10-CM | POA: Diagnosis not present

## 2021-05-30 DIAGNOSIS — K219 Gastro-esophageal reflux disease without esophagitis: Secondary | ICD-10-CM | POA: Insufficient documentation

## 2021-05-30 DIAGNOSIS — F419 Anxiety disorder, unspecified: Secondary | ICD-10-CM | POA: Diagnosis not present

## 2021-05-30 DIAGNOSIS — R1013 Epigastric pain: Secondary | ICD-10-CM | POA: Diagnosis not present

## 2021-05-30 DIAGNOSIS — I10 Essential (primary) hypertension: Secondary | ICD-10-CM | POA: Insufficient documentation

## 2021-05-30 DIAGNOSIS — K2970 Gastritis, unspecified, without bleeding: Secondary | ICD-10-CM | POA: Insufficient documentation

## 2021-05-30 DIAGNOSIS — K2289 Other specified disease of esophagus: Secondary | ICD-10-CM | POA: Diagnosis not present

## 2021-05-30 DIAGNOSIS — G8929 Other chronic pain: Secondary | ICD-10-CM

## 2021-05-30 HISTORY — PX: BIOPSY: SHX5522

## 2021-05-30 HISTORY — PX: ESOPHAGEAL DILATION: SHX303

## 2021-05-30 HISTORY — PX: ESOPHAGOGASTRODUODENOSCOPY (EGD) WITH PROPOFOL: SHX5813

## 2021-05-30 SURGERY — ESOPHAGOGASTRODUODENOSCOPY (EGD) WITH PROPOFOL
Anesthesia: General

## 2021-05-30 MED ORDER — LIDOCAINE HCL (CARDIAC) PF 100 MG/5ML IV SOSY
PREFILLED_SYRINGE | INTRAVENOUS | Status: DC | PRN
  Administered 2021-05-30: 40 mg via INTRAVENOUS

## 2021-05-30 MED ORDER — LACTATED RINGERS IV SOLN: INTRAVENOUS | Status: DC

## 2021-05-30 MED ORDER — PROPOFOL 500 MG/50ML IV EMUL
INTRAVENOUS | Status: DC | PRN
  Administered 2021-05-30: 150 ug/kg/min via INTRAVENOUS

## 2021-05-30 NOTE — Discharge Instructions (Signed)
No aspirin or NSAIDs for 3 days. Resume usual medications as before. Mechanical soft diet for 24 hours. No driving for 24 hours. Physician will call with biopsy results.

## 2021-05-30 NOTE — Transfer of Care (Signed)
Immediate Anesthesia Transfer of Care Note  Patient: Dennis Miles  Procedure(s) Performed: ESOPHAGOGASTRODUODENOSCOPY (EGD) WITH PROPOFOL ESOPHAGEAL DILATION BIOPSY  Patient Location: PACU  Anesthesia Type:General  Level of Consciousness: awake, alert , oriented and patient cooperative  Airway & Oxygen Therapy: Patient Spontanous Breathing  Post-op Assessment: Report given to RN, Post -op Vital signs reviewed and stable and Patient moving all extremities X 4  Post vital signs: Reviewed and stable  Last Vitals:  Vitals Value Taken Time  BP 108/94 05/30/21 0801  Temp    Pulse 72 05/30/21 0801  Resp 18 05/30/21 0801  SpO2 100 % 05/30/21 0801    Last Pain:  Vitals:   05/30/21 0801  TempSrc: Oral  PainSc: 0-No pain         Complications: No notable events documented.

## 2021-05-30 NOTE — Anesthesia Preprocedure Evaluation (Addendum)
Anesthesia Evaluation  Patient identified by MRN, date of birth, ID band Patient awake    Reviewed: Allergy & Precautions, H&P , NPO status , Patient's Chart, lab work & pertinent test results, reviewed documented beta blocker date and time   Airway Mallampati: II  TM Distance: >3 FB Neck ROM: full    Dental no notable dental hx.    Pulmonary neg pulmonary ROS,    Pulmonary exam normal breath sounds clear to auscultation       Cardiovascular Exercise Tolerance: Good hypertension, negative cardio ROS   Rhythm:regular Rate:Normal     Neuro/Psych  Headaches, PSYCHIATRIC DISORDERS Anxiety    GI/Hepatic Neg liver ROS, hiatal hernia, GERD  Medicated,  Endo/Other  negative endocrine ROS  Renal/GU negative Renal ROS  negative genitourinary   Musculoskeletal   Abdominal   Peds  Hematology negative hematology ROS (+)   Anesthesia Other Findings   Reproductive/Obstetrics negative OB ROS                             Anesthesia Physical Anesthesia Plan  ASA: 2  Anesthesia Plan: General   Post-op Pain Management:    Induction:   PONV Risk Score and Plan: Propofol infusion  Airway Management Planned:   Additional Equipment:   Intra-op Plan:   Post-operative Plan:   Informed Consent: I have reviewed the patients History and Physical, chart, labs and discussed the procedure including the risks, benefits and alternatives for the proposed anesthesia with the patient or authorized representative who has indicated his/her understanding and acceptance.     Dental Advisory Given  Plan Discussed with: CRNA  Anesthesia Plan Comments:         Anesthesia Quick Evaluation

## 2021-05-30 NOTE — Op Note (Signed)
[facility] Patient Name: Dennis Miles Procedure Date: 05/30/2021 7:22 AM MRN: [id number] Date of Birth: 1951/05/04 Attending MD: Hildred Laser , MD CSN: [id number] Age: 70 Admit Type: Outpatient Procedure:                Upper GI endoscopy Indications:              Epigastric abdominal pain, Esophageal dysphagia Providers:                Hildred Laser, MD, Lambert Mody, Kristine L.                            Risa Grill, Technician Referring MD:             Delphina Cahill,, MD Medicines:                Propofol per Anesthesia Complications:            No immediate complications. Estimated Blood Loss:     Estimated blood loss was minimal. Procedure:                Pre-Anesthesia Assessment:                           - Prior to the procedure, a History and Physical                            was performed, and patient medications and                            allergies were reviewed. The patient's tolerance of                            previous anesthesia was also reviewed. The risks                            and benefits of the procedure and the sedation                            options and risks were discussed with the patient.                            All questions were answered, and informed consent                            was obtained. Prior Anticoagulants: The patient has                            taken no previous anticoagulant or antiplatelet                            agents. ASA Grade Assessment: II - A patient with                            mild systemic disease. After reviewing the risks  and benefits, the patient was deemed in                            satisfactory condition to undergo the procedure.                           After obtaining informed consent, the endoscope was                            passed under direct vision. Throughout the                            procedure, the patient's blood pressure, pulse, and                             oxygen saturations were monitored continuously. The                            GIF-H190 ([id number]) scope was introduced through the                            mouth, and advanced to the second part of duodenum.                            The upper GI endoscopy was accomplished without                            difficulty. The patient tolerated the procedure                            well. Scope In: 7:37:44 AM Scope Out: 7:53:52 AM Total Procedure Duration: 0 hours 16 minutes 8 seconds  Findings:      The hypopharynx was normal.      The proximal esophagus was normal.      Mucosal changes including longitudinal furrows and circumferential folds       were found in the mid esophagus and in the distal esophagus. Biopsies       were taken with a cold forceps for histology. The pathology specimen was       placed into Bottle Number 3.      One benign-appearing, intrinsic stenosis was found 38 cm from the       incisors. The stenosis was initially dilated with the scope and       traversed. A TTS dilator was passed through the scope. Dilation with a       6-7-8 mm balloon dilator was performed to 15 mm and 16.5 mm. The       dilation site was examined and showed moderate mucosal disruption,       moderate improvement in luminal narrowing and no perforation.      A 2 cm hiatal hernia was present.      A few small sessile polyps with no bleeding and no stigmata of recent       bleeding were found in the gastric fundus and in the gastric body.       Biopsies were taken with a cold forceps for histology. The pathology  specimen was placed into Bottle Number 1.      Patchy mild inflammation characterized by congestion (edema) and       erythema was found in the gastric antrum. Biopsies were taken with a       cold forceps for histology. The pathology specimen was placed into       Bottle Number 2.      The duodenal bulb and second portion of the duodenum were  normal. Impression:               - Normal hypopharynx.                           - Normal proximal esophagus.                           - Esophageal mucosal changes suspicious for                            eosinophilic esophagitis. Biopsied.                           - Benign-appearing esophageal stenosis. Chromoscopy                            performed. Dilated.                           - 2 cm hiatal hernia.                           - A few gastric polyps. Biopsied.                           - Gastritis. Biopsied.                           - Normal duodenal bulb and second portion of the                            duodenum. Moderate Sedation:      Per Anesthesia Care Recommendation:           - Patient has a contact number available for                            emergencies. The signs and symptoms of potential                            delayed complications were discussed with the                            patient. Return to normal activities tomorrow.                            Written discharge instructions were provided to the                            patient.                           -  Mechanical soft diet today.                           - Continue present medications.                           - No aspirin, ibuprofen, naproxen, or other                            non-steroidal anti-inflammatory drugs for 3 days.                           - Await pathology results.                           - Await pathology results. Procedure Code(s):        --- Professional ---                           [phone], Esophagogastroduodenoscopy, flexible,                            transoral; with transendoscopic balloon dilation of                            esophagus (less than 30 mm diameter) Diagnosis Code(s):        --- Professional ---                           K22.8, Other specified diseases of esophagus                           K22.2, Esophageal obstruction                            K44.9, Diaphragmatic hernia without obstruction or                            gangrene                           K31.7, Polyp of stomach and duodenum                           K29.70, Gastritis, unspecified, without bleeding                           R10.13, Epigastric pain                           R13.14, Dysphagia, pharyngoesophageal phase CPT copyright 2019 [employer]. All rights reserved. The codes documented in this report are preliminary and upon coder review may  be revised to meet current compliance requirements. Hildred Laser, MD Hildred Laser, MD 05/30/2021 8:09:06 AM This report has been signed electronically. Number of Addenda: 0

## 2021-05-30 NOTE — H&P (Signed)
Dennis Miles is an 70 y.o. male.   Chief Complaint: Patient is here for esophagogastroduodenoscopy and esophageal dilation. HPI: Patient is 70 year old Caucasian male who has a history of GERD who presents with over 6 months history of intermittent epigastric pain described blood pressure worse with meals but not associate with nausea or vomiting.  He was seen in the office 4 weeks ago and PPI dose was increased but he does not feel any better.  He also complains of intermittent dysphagia to solids and to pills.  He may occasionally regurgitated pills but he has never had food impaction.  He denies melena or rectal bleeding.  His appetite is normal and he has not lost any weight.  He does not take NSAIDs anymore.  He does not smoke cigarettes and drinks alcohol socially.  He may have couple of drinks per month.  Past Medical History:  Diagnosis Date   Anxiety    GERD (gastroesophageal reflux disease)    Gout    Headache(784.0)    Hiatal hernia    High cholesterol    Hypertension    Skin cancer     Past Surgical History:  Procedure Laterality Date   APPENDECTOMY     COLONOSCOPY N/A 10/23/2012   Procedure: COLONOSCOPY;  Surgeon: Rogene Houston, MD;  Location: [facility];  Service: Endoscopy;  Laterality: N/A;  235   ESOPHAGOGASTRODUODENOSCOPY  04/17/2011   Procedure: ESOPHAGOGASTRODUODENOSCOPY (EGD);  Surgeon: Rogene Houston, MD;  Location: [facility];  Service: Endoscopy;  Laterality: N/A;  8:30   HAND SURGERY  05/28/2016   INGUINAL HERNIA REPAIR Left 02/17/2013   Procedure: HERNIA REPAIR INGUINAL ADULT;  Surgeon: Jamesetta So, MD;  Location: [facility];  Service: General;  Laterality: Left;   INSERTION OF MESH Left 02/17/2013   Procedure: INSERTION OF MESH;  Surgeon: Jamesetta So, MD;  Location: [facility];  Service: General;  Laterality: Left;   SINOSCOPY     sinus sugery     SKIN CANCER EXCISION     x4    Family History  Problem Relation Age of Onset   Aneurysm Father     Asthma Child    Colon cancer Neg Hx    Social History:  reports that he has never smoked. He quit smokeless tobacco use about 8 years ago.  His smokeless tobacco use included chew. He reports current alcohol use of about 1.0 standard drink per week. He reports that he does not use drugs.  Allergies:  Allergies  Allergen Reactions   Codeine Nausea Only    Medications Prior to Admission  Medication Sig Dispense Refill   allopurinol (ZYLOPRIM) 100 MG tablet Take 2 tablets (200 mg total) by mouth daily. (Patient taking differently: Take 100 mg by mouth daily.) 180 tablet 0   cyclobenzaprine (FLEXERIL) 5 MG tablet Take 5 mg by mouth daily as needed for muscle spasms (pinched nerve).     EPINEPHrine 0.3 mg/0.3 mL IJ SOAJ injection Inject 0.3 mg into the muscle as needed for anaphylaxis.     ezetimibe (ZETIA) 10 MG tablet Take 10 mg by mouth daily.     fluticasone (FLONASE) 50 MCG/ACT nasal spray Place 2 sprays into both nostrils daily as needed for allergies.     levocetirizine (XYZAL) 5 MG tablet Take 5 mg by mouth daily.     lisinopril (PRINIVIL,ZESTRIL) 10 MG tablet Take 10 mg by mouth daily.     pantoprazole (PROTONIX) 40 MG tablet Take 1 tablet (40 mg  total) by mouth daily. 30 tablet 2   PRESCRIPTION MEDICATION Allergy injections     zolpidem (AMBIEN) 10 MG tablet Take 10 mg by mouth at bedtime.      No results found for this or any previous visit (from the past 48 hour(s)). No results found.  Review of Systems  Blood pressure 132/86, pulse 75, temperature 97.9 F (36.6 C), temperature source Oral, resp. rate 13, SpO2 100 %. Physical Exam HENT:     Mouth/Throat:     Mouth: Mucous membranes are moist.     Pharynx: Oropharynx is clear.  Eyes:     General: No scleral icterus.    Conjunctiva/sclera: Conjunctivae normal.  Cardiovascular:     Rate and Rhythm: Normal rate and regular rhythm.     Heart sounds: Normal heart sounds.  Pulmonary:     Effort: Pulmonary effort is normal.      Breath sounds: Normal breath sounds.  Abdominal:     General: There is no distension.     Palpations: Abdomen is soft. There is no mass.     Tenderness: There is no abdominal tenderness.  Musculoskeletal:     Cervical back: Neck supple.  Lymphadenopathy:     Cervical: No cervical adenopathy.  Neurological:     Mental Status: He is alert.     Assessment/Plan  Epigastric pain not respond to therapy. Esophageal dysphagia. Esophagogastroduodenoscopy with esophageal dilation under monitored anesthesia care.  Hildred Laser, MD 05/30/2021, 7:20 AM

## 2021-05-30 NOTE — Anesthesia Postprocedure Evaluation (Signed)
Anesthesia Post Note  Patient: Dennis Miles  Procedure(s) Performed: ESOPHAGOGASTRODUODENOSCOPY (EGD) WITH PROPOFOL ESOPHAGEAL DILATION BIOPSY  Patient location during evaluation: Phase II Anesthesia Type: General Level of consciousness: awake Pain management: pain level controlled Vital Signs Assessment: post-procedure vital signs reviewed and stable Respiratory status: spontaneous breathing and respiratory function stable Cardiovascular status: blood pressure returned to baseline and stable Postop Assessment: no headache and no apparent nausea or vomiting Anesthetic complications: no Comments: Late entry   No notable events documented.   Last Vitals:  Vitals:   05/30/21 0703 05/30/21 0801  BP: 132/86 (!) 108/94  Pulse: 75 72  Resp: 13 18  Temp: 36.6 C   SpO2: 100% 100%    Last Pain:  Vitals:   05/30/21 0801  TempSrc: Oral  PainSc: 0-No pain                 Louann Sjogren

## 2021-06-01 LAB — SURGICAL PATHOLOGY

## 2021-06-03 HISTORY — PX: SQUAMOUS CELL CARCINOMA EXCISION: SHX2433

## 2021-06-04 ENCOUNTER — Other Ambulatory Visit (INDEPENDENT_AMBULATORY_CARE_PROVIDER_SITE_OTHER): Payer: Self-pay | Admitting: Internal Medicine

## 2021-06-04 MED ORDER — FLUTICASONE PROPIONATE HFA 220 MCG/ACT IN AERO
4.0000 | INHALATION_SPRAY | Freq: Two times a day (BID) | RESPIRATORY_TRACT | 0 refills | Status: DC
Start: 2021-06-04 — End: 2021-06-13

## 2021-06-05 ENCOUNTER — Encounter (HOSPITAL_COMMUNITY): Payer: Self-pay | Admitting: Internal Medicine

## 2021-06-06 ENCOUNTER — Telehealth: Payer: Self-pay

## 2021-06-06 ENCOUNTER — Ambulatory Visit (INDEPENDENT_AMBULATORY_CARE_PROVIDER_SITE_OTHER): Payer: Medicare Other

## 2021-06-06 DIAGNOSIS — J309 Allergic rhinitis, unspecified: Secondary | ICD-10-CM | POA: Diagnosis not present

## 2021-06-06 NOTE — Telephone Encounter (Signed)
Records have been faxed to [facility]

## 2021-06-08 ENCOUNTER — Ambulatory Visit (INDEPENDENT_AMBULATORY_CARE_PROVIDER_SITE_OTHER): Payer: Medicare Other

## 2021-06-08 DIAGNOSIS — J309 Allergic rhinitis, unspecified: Secondary | ICD-10-CM

## 2021-06-11 DIAGNOSIS — C44629 Squamous cell carcinoma of skin of left upper limb, including shoulder: Secondary | ICD-10-CM | POA: Diagnosis not present

## 2021-06-11 DIAGNOSIS — D485 Neoplasm of uncertain behavior of skin: Secondary | ICD-10-CM | POA: Diagnosis not present

## 2021-06-13 ENCOUNTER — Ambulatory Visit (INDEPENDENT_AMBULATORY_CARE_PROVIDER_SITE_OTHER): Payer: Medicare Other

## 2021-06-13 ENCOUNTER — Other Ambulatory Visit (INDEPENDENT_AMBULATORY_CARE_PROVIDER_SITE_OTHER): Payer: Self-pay | Admitting: *Deleted

## 2021-06-13 DIAGNOSIS — J309 Allergic rhinitis, unspecified: Secondary | ICD-10-CM

## 2021-06-13 MED ORDER — FLUTICASONE PROPIONATE HFA 220 MCG/ACT IN AERO: 4.0000 | INHALATION_SPRAY | Freq: Two times a day (BID) | RESPIRATORY_TRACT | 0 refills | Status: DC

## 2021-06-15 ENCOUNTER — Ambulatory Visit (INDEPENDENT_AMBULATORY_CARE_PROVIDER_SITE_OTHER): Payer: Medicare Other

## 2021-06-15 DIAGNOSIS — E782 Mixed hyperlipidemia: Secondary | ICD-10-CM | POA: Diagnosis not present

## 2021-06-15 DIAGNOSIS — J309 Allergic rhinitis, unspecified: Secondary | ICD-10-CM

## 2021-06-15 DIAGNOSIS — R7301 Impaired fasting glucose: Secondary | ICD-10-CM | POA: Diagnosis not present

## 2021-06-15 DIAGNOSIS — E790 Hyperuricemia without signs of inflammatory arthritis and tophaceous disease: Secondary | ICD-10-CM | POA: Diagnosis not present

## 2021-06-17 NOTE — Progress Notes (Signed)
Cardiology Office Note:    Date:  06/20/2021   ID:  Dennis Miles, DOB [birth year], MRN [id number]  PCP:  Celene Squibb, MD  Cardiologist:  None  Electrophysiologist:  None   Referring MD: Celene Squibb, MD   Chief Complaint  Patient presents with   Chest Pain    History of Present Illness:    Dennis Miles is a 71 y.o. male with a hx of hypertension, hypercholesterol who is referred by Dr. Nevada Crane for evaluation of chest pain and dyspnea.  He reports he has been having chest pain x6 months.  Describes pressure in center chest.  Underwent endoscopy and found to have possible eosinophilic esophagitis.  He was started on steroid inhaler and reports significant improvement.  Does report though that he has been having dyspnea with exertion.  States that he notices it when he goes for long walks.  He tries to walk each day for 15 minutes.  He denies any lightheadedness, syncope, lower extremity edema, or palpitations.  No smoking history.  Family history includes mother had CABG in 73s.  Brother had MI in 33s and died of brain aneurysm in 63s.  Father also died of brain aneurysm in 47s.   Past Medical History:  Diagnosis Date   Anxiety    GERD (gastroesophageal reflux disease)    Gout    Headache(784.0)    Hiatal hernia    High cholesterol    Hypertension    Skin cancer     Past Surgical History:  Procedure Laterality Date   APPENDECTOMY     BIOPSY  05/30/2021   Procedure: BIOPSY;  Surgeon: Rogene Houston, MD;  [facility];  Service: Endoscopy;;   COLONOSCOPY N/A 10/23/2012   Procedure: COLONOSCOPY;  Surgeon: Rogene Houston, MD;  [facility];  Service: Endoscopy;  Laterality: N/A;  235   ESOPHAGEAL DILATION N/A 05/30/2021   Procedure: ESOPHAGEAL DILATION;  Surgeon: Rogene Houston, MD;  [facility];  Service: Endoscopy;  Laterality: N/A;   ESOPHAGOGASTRODUODENOSCOPY  04/17/2011   Procedure: ESOPHAGOGASTRODUODENOSCOPY (EGD);  Surgeon: Rogene Houston, MD;  [facility];  Service: Endoscopy;  Laterality: N/A;  8:30   ESOPHAGOGASTRODUODENOSCOPY (EGD) WITH PROPOFOL N/A 05/30/2021   Procedure: ESOPHAGOGASTRODUODENOSCOPY (EGD) WITH PROPOFOL;  Surgeon: Rogene Houston, MD;  [facility];  Service: Endoscopy;  Laterality: N/A;  7:30   HAND SURGERY  05/28/2016   INGUINAL HERNIA REPAIR Left 02/17/2013   Procedure: HERNIA REPAIR INGUINAL ADULT;  Surgeon: Jamesetta So, MD;  Location: [facility];  Service: General;  Laterality: Left;   INSERTION OF MESH Left 02/17/2013   Procedure: INSERTION OF MESH;  Surgeon: Jamesetta So, MD;  Location: [facility];  Service: General;  Laterality: Left;   SINOSCOPY     sinus sugery     SKIN CANCER EXCISION     x4    Current Medications: Current Meds  Medication Sig   allopurinol (ZYLOPRIM) 100 MG tablet Take 2 tablets (200 mg total) by mouth daily. (Patient taking differently: Take 100 mg by mouth daily.)   cyclobenzaprine (FLEXERIL) 5 MG tablet Take 5 mg by mouth daily as needed for muscle spasms (pinched nerve).   EPINEPHrine 0.3 mg/0.3 mL IJ SOAJ injection Inject 0.3 mg into the muscle as needed for anaphylaxis.   ezetimibe (ZETIA) 10 MG tablet Take 10 mg by mouth daily.   fluticasone (FLONASE) 50 MCG/ACT nasal spray Place 2 sprays into both  nostrils daily as needed for allergies.   fluticasone (FLOVENT HFA) 220 MCG/ACT inhaler Inhale 4 puffs into the lungs 2 (two) times daily.   levocetirizine (XYZAL) 5 MG tablet Take 5 mg by mouth daily.   lisinopril (PRINIVIL,ZESTRIL) 10 MG tablet Take 10 mg by mouth daily.   metoprolol tartrate (LOPRESSOR) 100 MG tablet Take 1 tablet two (2) hours prior to CT Scan   pantoprazole (PROTONIX) 40 MG tablet Take 1 tablet (40 mg total) by mouth daily.   PRESCRIPTION MEDICATION Allergy injections   zolpidem (AMBIEN) 10 MG tablet Take 10 mg by mouth at bedtime.     Allergies:   Codeine   Social History   Socioeconomic History   Marital status:  Divorced    Spouse name: Not on file   Number of children: Not on file   Years of education: Not on file   Highest education level: Not on file  Occupational History   Not on file  Tobacco Use   Smoking status: Never   Smokeless tobacco: Former    Types: Chew    Quit date: 06/03/2012  Vaping Use   Vaping Use: Never used  Substance and Sexual Activity   Alcohol use: Yes    Alcohol/week: 1.0 standard drink    Types: 1 Cans of beer per week    Comment: occ   Drug use: Never   Sexual activity: Not on file  Other Topics Concern   Not on file  Social History Narrative   Lives at home with daughter.  Retired.  Education 12th grade.  Children 2.     Social Determinants of Health   Financial Resource Strain: Not on file  Food Insecurity: Not on file  Transportation Needs: Not on file  Physical Activity: Not on file  Stress: Not on file  Social Connections: Not on file     Family History: The patient's family history includes Aneurysm in his father; Asthma in his child. There is no history of Colon cancer.  ROS:   Please see the history of present illness.     All other systems reviewed and are negative.  EKGs/Labs/Other Studies Reviewed:    The following studies were reviewed today:   EKG:   05/29/21: Normal sinus rhythm, rate 79, Q-wave in III, no ST abnormalities  Recent Labs: 01/09/2021: ALT 22; Hemoglobin 13.9; Platelets 192 06/20/2021: BUN 10; Creatinine, Ser 1.02; Potassium 4.1; Sodium 136  Recent Lipid Panel No results found for: CHOL, TRIG, HDL, CHOLHDL, VLDL, LDLCALC, LDLDIRECT  Physical Exam:    VS:  BP 114/78    Pulse 74    Ht 5\' 11"  (1.803 m)    Wt 164 lb 6.4 oz (74.6 kg)    SpO2 99%    BMI 22.93 kg/m     Wt Readings from Last 3 Encounters:  06/20/21 164 lb 6.4 oz (74.6 kg)  05/29/21 165 lb 4.8 oz (75 kg)  05/14/21 165 lb 4.8 oz (75 kg)     GEN:  Well nourished, well developed in no acute distress HEENT: Normal NECK: No JVD; No carotid  bruits LYMPHATICS: No lymphadenopathy CARDIAC: RRR, no murmurs, rubs, gallops RESPIRATORY:  Clear to auscultation without rales, wheezing or rhonchi  ABDOMEN: Soft, non-tender, non-distended MUSCULOSKELETAL:  No edema; No deformity  SKIN: Warm and dry NEUROLOGIC:  Alert and oriented x 3 PSYCHIATRIC:  Normal affect   ASSESSMENT:    1. Family history of brain aneurysm   2. Chest pain of uncertain etiology   3.  Chest pain, unspecified type   4. Precordial pain   5. Essential hypertension   6. Hyperlipidemia, unspecified hyperlipidemia type    PLAN:    Chest pain/DOE: Having atypical chest pain but does have significant CAD risk factors (hypertension, hyperlipidemia, family history).  Suspect chest pain may be due to eosinophilic esophagitis, is improving with treatment.  However is also having dyspnea with exertion that could represent anginal equivalent.  Recommend coronary CTA to rule out obstructive CAD.  We will give Lopressor 100 mg prior to study  Hypertension: On lisinopril 10 mg daily. Appears controlled  Hyperlipidemia: On Zetia 10 mg daily.  Unable to tolerate statins due to myalgias.  LDL 139 on 02/08/21.  We will follow-up results of coronary CTA to guide how aggressive to be in lowering cholesterol  Family history of intracranial aneurysms: Reports both his brother and father died of intracranial aneurysm rupture.  Recommend screening with CTA head to evaluate for aneurysm   RTC in 6 months   Medication Adjustments/Labs and Tests Ordered: Current medicines are reviewed at length with the patient today.  Concerns regarding medicines are outlined above.  Orders Placed This Encounter  Procedures   CT ANGIO HEAD W OR WO CONTRAST   CT CORONARY MORPH W/CTA COR W/SCORE W/CA W/CM &/OR WO/CM   Basic metabolic panel   Meds ordered this encounter  Medications   metoprolol tartrate (LOPRESSOR) 100 MG tablet    Sig: Take 1 tablet two (2) hours prior to CT Scan    Dispense:  1  tablet    Refill:  0    Patient Instructions  Medication Instructions:  Your physician recommends that you continue on your current medications as directed. Please refer to the Current Medication list given to you today.  *If you need a refill on your cardiac medications before your next appointment, please call your pharmacy*   Lab Work: BMET If you have labs (blood work) drawn today and your tests are completely normal, you will receive your results only by: [place] (if you have MyChart) OR A paper copy in the mail If you have any lab test that is abnormal or we need to change your treatment, we will call you to review the results.   Testing/Procedures: Coronary CT Scan CT Head    Follow-Up: At [facility], you and your health needs are our priority.  As part of our continuing mission to provide you with exceptional heart care, we have created designated Provider Care Teams.  These Care Teams include your primary Cardiologist (physician) and Advanced Practice Providers (APPs -  Physician Assistants and Nurse Practitioners) who all work together to provide you with the care you need, when you need it.  We recommend signing up for the patient portal called "MyChart".  Sign up information is provided on this After Visit Summary.  MyChart is used to connect with patients for Virtual Visits (Telemedicine).  Patients are able to view lab/test results, encounter notes, upcoming appointments, etc.  Non-urgent messages can be sent to your provider as well.   To learn more about what you can do with MyChart, go to [email or web address].    Your next appointment:   6 month(s)  The format for your next appointment:   In Person  Provider:   Oswaldo Milian, MD     Other Instructions   Your cardiac CT will be scheduled at one of the below locations:   [facility] [address] [place], [place] [address] [phone]  [place] [address] [place], [place] [address] [phone]  If scheduled at [facility], please arrive at the [facility] main entrance (entrance A) of [facility] 30 minutes prior to test start time. You can use the FREE valet parking offered at the main entrance (encouraged to control the heart rate for the test) Proceed to the [facility] Radiology Department (first floor) to check-in and test prep.  If scheduled at [facility], please arrive 15 mins early for check-in and test prep.  Please follow these instructions carefully (unless otherwise directed):  TAKE LOPRESSOR 100 mg tablet TWO HOURS PRIOR TO CT  Hold all erectile dysfunction medications at least 3 days (72 hrs) prior to test.  On the Night Before the Test: Be sure to Drink plenty of water. Do not consume any caffeinated/decaffeinated beverages or chocolate 12 hours prior to your test. Do not take any antihistamines 12 hours prior to your test.  On the Day of the Test: Drink plenty of water until 1 hour prior to the test. Do not eat any food 4 hours prior to the test. You may take your regular medications prior to the test.  Take metoprolol (Lopressor) two hours prior to test. HOLD Furosemide/Hydrochlorothiazide morning of the test.  After the Test: Drink plenty of water. After receiving IV contrast, you may experience a mild flushed feeling. This is normal. On occasion, you may experience a mild rash up to 24 hours after the test. This is not dangerous. If this occurs, you can take Benadryl 25 mg and increase your fluid intake. If you experience trouble breathing, this can be serious. If it is severe call 911 IMMEDIATELY. If it is mild, please call our office. If you take any of these medications: Glipizide/Metformin, Avandament, Glucavance, please do not take 48 hours after completing test unless otherwise instructed.  We will call to schedule  your test 2-4 weeks out understanding that some insurance companies will need an authorization prior to the service being performed.   For non-scheduling related questions, please contact the cardiac imaging nurse navigator should you have any questions/concerns: Marchia Bond, Cardiac Imaging Nurse Navigator Gordy Clement, Cardiac Imaging Nurse Navigator [facility] Direct Office Dial: [phone]   For scheduling needs, including cancellations and rescheduling, please call [place], [phone].       Signed, Donato Heinz, MD  06/20/2021 1:50 PM    [facility]

## 2021-06-20 ENCOUNTER — Other Ambulatory Visit: Payer: Self-pay

## 2021-06-20 ENCOUNTER — Encounter: Payer: Self-pay | Admitting: Cardiology

## 2021-06-20 ENCOUNTER — Ambulatory Visit (INDEPENDENT_AMBULATORY_CARE_PROVIDER_SITE_OTHER): Payer: Medicare Other

## 2021-06-20 ENCOUNTER — Encounter: Payer: Self-pay | Admitting: *Deleted

## 2021-06-20 ENCOUNTER — Other Ambulatory Visit (HOSPITAL_COMMUNITY)
Admission: RE | Admit: 2021-06-20 | Discharge: 2021-06-20 | Disposition: A | Discharge status: Home / Self Care | Payer: Medicare Other | Source: Ambulatory Visit | Attending: Cardiology | Admitting: Cardiology

## 2021-06-20 ENCOUNTER — Ambulatory Visit (INDEPENDENT_AMBULATORY_CARE_PROVIDER_SITE_OTHER): Payer: Medicare Other | Admitting: Cardiology

## 2021-06-20 VITALS — BP 114/78 | HR 74 | Ht 71.0 in | Wt 164.4 lb

## 2021-06-20 DIAGNOSIS — R079 Chest pain, unspecified: Secondary | ICD-10-CM | POA: Insufficient documentation

## 2021-06-20 DIAGNOSIS — G248 Other dystonia: Secondary | ICD-10-CM | POA: Diagnosis not present

## 2021-06-20 DIAGNOSIS — E790 Hyperuricemia without signs of inflammatory arthritis and tophaceous disease: Secondary | ICD-10-CM | POA: Diagnosis not present

## 2021-06-20 DIAGNOSIS — K200 Eosinophilic esophagitis: Secondary | ICD-10-CM | POA: Diagnosis not present

## 2021-06-20 DIAGNOSIS — R072 Precordial pain: Secondary | ICD-10-CM | POA: Diagnosis not present

## 2021-06-20 DIAGNOSIS — I10 Essential (primary) hypertension: Secondary | ICD-10-CM | POA: Diagnosis not present

## 2021-06-20 DIAGNOSIS — E785 Hyperlipidemia, unspecified: Secondary | ICD-10-CM

## 2021-06-20 DIAGNOSIS — Z8249 Family history of ischemic heart disease and other diseases of the circulatory system: Secondary | ICD-10-CM

## 2021-06-20 DIAGNOSIS — R7301 Impaired fasting glucose: Secondary | ICD-10-CM | POA: Diagnosis not present

## 2021-06-20 DIAGNOSIS — G5600 Carpal tunnel syndrome, unspecified upper limb: Secondary | ICD-10-CM | POA: Diagnosis not present

## 2021-06-20 DIAGNOSIS — C4492 Squamous cell carcinoma of skin, unspecified: Secondary | ICD-10-CM | POA: Diagnosis not present

## 2021-06-20 DIAGNOSIS — J309 Allergic rhinitis, unspecified: Secondary | ICD-10-CM

## 2021-06-20 DIAGNOSIS — E875 Hyperkalemia: Secondary | ICD-10-CM | POA: Diagnosis not present

## 2021-06-20 DIAGNOSIS — K219 Gastro-esophageal reflux disease without esophagitis: Secondary | ICD-10-CM | POA: Diagnosis not present

## 2021-06-20 DIAGNOSIS — E782 Mixed hyperlipidemia: Secondary | ICD-10-CM | POA: Diagnosis not present

## 2021-06-20 DIAGNOSIS — G4700 Insomnia, unspecified: Secondary | ICD-10-CM | POA: Diagnosis not present

## 2021-06-20 LAB — BASIC METABOLIC PANEL
Anion gap: 10 (ref 5–15)
BUN: 10 mg/dL (ref 8–23)
CO2: 26 mmol/L (ref 22–32)
Calcium: 8.8 mg/dL — ABNORMAL LOW (ref 8.9–10.3)
Chloride: 100 mmol/L (ref 98–111)
Creatinine, Ser: 1.02 mg/dL (ref 0.61–1.24)
GFR, Estimated: >60 mL/min (ref >60)
Glucose, Bld: 103 mg/dL — ABNORMAL HIGH (ref 70–99)
Potassium: 4.1 mmol/L (ref 3.5–5.1)
Sodium: 136 mmol/L (ref 135–145)

## 2021-06-20 MED ORDER — METOPROLOL TARTRATE 100 MG PO TABS: ORAL_TABLET | ORAL | 0 refills | Status: DC

## 2021-06-20 NOTE — Patient Instructions (Signed)
Medication Instructions:  Your physician recommends that you continue on your current medications as directed. Please refer to the Current Medication list given to you today.  *If you need a refill on your cardiac medications before your next appointment, please call your pharmacy*   Lab Work: BMET If you have labs (blood work) drawn today and your tests are completely normal, you will receive your results only by: [place] (if you have MyChart) OR A paper copy in the mail If you have any lab test that is abnormal or we need to change your treatment, we will call you to review the results.   Testing/Procedures: Coronary CT Scan CT Head    Follow-Up: At [facility], you and your health needs are our priority.  As part of our continuing mission to provide you with exceptional heart care, we have created designated Provider Care Teams.  These Care Teams include your primary Cardiologist (physician) and Advanced Practice Providers (APPs -  Physician Assistants and Nurse Practitioners) who all work together to provide you with the care you need, when you need it.  We recommend signing up for the patient portal called "MyChart".  Sign up information is provided on this After Visit Summary.  MyChart is used to connect with patients for Virtual Visits (Telemedicine).  Patients are able to view lab/test results, encounter notes, upcoming appointments, etc.  Non-urgent messages can be sent to your provider as well.   To learn more about what you can do with MyChart, go to [email or web address].    Your next appointment:   6 month(s)  The format for your next appointment:   In Person  Provider:   Oswaldo Milian, MD     Other Instructions   Your cardiac CT will be scheduled at one of the below locations:   [facility] [address] [place], [place] [address] [phone]  [place] [address] [place], [place] [address] [phone]  If scheduled at [facility], please arrive at the [facility] main entrance (entrance A) of [facility] 30 minutes prior to test start time. You can use the FREE valet parking offered at the main entrance (encouraged to control the heart rate for the test) Proceed to the [facility] Radiology Department (first floor) to check-in and test prep.  If scheduled at [facility], please arrive 15 mins early for check-in and test prep.  Please follow these instructions carefully (unless otherwise directed):  TAKE LOPRESSOR 100 mg tablet TWO HOURS PRIOR TO CT  Hold all erectile dysfunction medications at least 3 days (72 hrs) prior to test.  On the Night Before the Test: Be sure to Drink plenty of water. Do not consume any caffeinated/decaffeinated beverages or chocolate 12 hours prior to your test. Do not take any antihistamines 12 hours prior to your test.  On the Day of the Test: Drink plenty of water until 1 hour prior to the test. Do not eat any food 4 hours prior to the test. You may take your regular medications prior to the test.  Take metoprolol (Lopressor) two hours prior to test. HOLD Furosemide/Hydrochlorothiazide morning of the test.  After the Test: Drink plenty of water. After receiving IV contrast, you may experience a mild flushed feeling. This is normal. On occasion, you may experience a mild rash up to 24 hours after the test. This is not dangerous. If this occurs, you can take Benadryl 25  mg and increase your fluid intake. If you experience trouble breathing, this can be serious. If it is severe call 911 IMMEDIATELY. If it is mild, please call our office. If you take any of these medications: Glipizide/Metformin, Avandament, Glucavance, please do not take 48 hours after completing test unless otherwise instructed.  We will call to schedule your test 2-4 weeks out understanding  that some insurance companies will need an authorization prior to the service being performed.   For non-scheduling related questions, please contact the cardiac imaging nurse navigator should you have any questions/concerns: Marchia Bond, Cardiac Imaging Nurse Navigator Gordy Clement, Cardiac Imaging Nurse Navigator [place] [facility] Direct Office Dial: [phone]   For scheduling needs, including cancellations and rescheduling, please call [place], [phone].

## 2021-06-22 ENCOUNTER — Ambulatory Visit (INDEPENDENT_AMBULATORY_CARE_PROVIDER_SITE_OTHER): Payer: Medicare Other

## 2021-06-22 DIAGNOSIS — J309 Allergic rhinitis, unspecified: Secondary | ICD-10-CM

## 2021-06-25 ENCOUNTER — Telehealth (HOSPITAL_COMMUNITY): Payer: Self-pay | Admitting: Emergency Medicine

## 2021-06-25 ENCOUNTER — Telehealth: Payer: Self-pay | Admitting: *Deleted

## 2021-06-25 NOTE — Telephone Encounter (Signed)
Labs received from:Dr. Wende Neighbors  Drawn on:06/15/2021  Reviewed by:Hazel Sams, PA-C  Labs drawn:Uric Acid, Hgb A1C, Albumin/Creatinine Ratio, Lipid Panel, CMPCBC  Results:RBC 3.95  MCV 99  MCH 34.4  Cholesterol 231  Triglycerides 294  VLDL  Cholesterol Cal 52  LDL Chol AClc 125  BUN 7  BUN/Creat. Ratio 6

## 2021-06-25 NOTE — Telephone Encounter (Signed)
Reaching out to patient to offer assistance regarding upcoming cardiac imaging study; pt verbalizes understanding of appt date/time, parking situation and where to check in, pre-test NPO status and medications ordered, and verified current allergies; name and call back number provided for further questions should they arise Marchia Bond RN Navigator Cardiac Imaging [facility] [phone] office [phone] cell  Denies iv issues Arrival 1230 CCTA + CTA head 100mg  metoprolol tartrate

## 2021-06-26 ENCOUNTER — Ambulatory Visit (HOSPITAL_COMMUNITY)
Admission: RE | Admit: 2021-06-26 | Discharge: 2021-06-26 | Disposition: A | Discharge status: Home / Self Care | Payer: Medicare Other | Source: Ambulatory Visit | Attending: Cardiology | Admitting: Cardiology

## 2021-06-26 ENCOUNTER — Ambulatory Visit (HOSPITAL_COMMUNITY): Payer: Medicare Other

## 2021-06-26 ENCOUNTER — Other Ambulatory Visit: Payer: Self-pay

## 2021-06-26 DIAGNOSIS — I700 Atherosclerosis of aorta: Secondary | ICD-10-CM | POA: Insufficient documentation

## 2021-06-26 DIAGNOSIS — R072 Precordial pain: Secondary | ICD-10-CM

## 2021-06-26 DIAGNOSIS — I2510 Atherosclerotic heart disease of native coronary artery without angina pectoris: Secondary | ICD-10-CM | POA: Diagnosis not present

## 2021-06-26 DIAGNOSIS — Z8249 Family history of ischemic heart disease and other diseases of the circulatory system: Secondary | ICD-10-CM | POA: Diagnosis not present

## 2021-06-26 DIAGNOSIS — Z9889 Other specified postprocedural states: Secondary | ICD-10-CM | POA: Diagnosis not present

## 2021-06-26 DIAGNOSIS — I6523 Occlusion and stenosis of bilateral carotid arteries: Secondary | ICD-10-CM | POA: Diagnosis not present

## 2021-06-26 MED ORDER — NITROGLYCERIN 0.4 MG SL SUBL
SUBLINGUAL_TABLET | SUBLINGUAL | Status: AC
  Filled 2021-06-26: qty 2

## 2021-06-26 MED ORDER — NITROGLYCERIN 0.4 MG SL SUBL: 0.8000 mg | SUBLINGUAL_TABLET | Freq: Once | SUBLINGUAL | Status: DC

## 2021-06-26 MED ORDER — IOHEXOL 350 MG/ML SOLN
145.0000 mL | Freq: Once | INTRAVENOUS | Status: AC | PRN
  Administered 2021-06-26: 13:00:00 145 mL via INTRAVENOUS

## 2021-06-26 NOTE — Progress Notes (Signed)
Stood up, walked in room no dizziness

## 2021-06-27 DIAGNOSIS — C44629 Squamous cell carcinoma of skin of left upper limb, including shoulder: Secondary | ICD-10-CM | POA: Diagnosis not present

## 2021-06-28 NOTE — Progress Notes (Signed)
Office Visit Note  Patient: Dennis Miles             Date of Birth: Nov 04, 1950           MRN: [id number]             PCP: Celene Squibb, MD Referring: Celene Squibb, MD Visit Date: 07/12/2021 Occupation: @GUAROCC @  Subjective:  Right knee joint pain   History of Present Illness: Dennis Miles is a 71 y.o. male with history of gout, osteoarthritis, and DDD.  Patient is taking allopurinol 200 mg daily and Mitigare 0.6 mg daily as needed during flares.  He denies any recent gout flares.  He states he has been experiencing increased pain in the right knee joint for the past several months.  He states that he was standing on flat ground and thinks he may have twisted his right knee.  He has not been experiencing any buckling or mechanical symptoms.  His right knee joint has been swelling intermittently.  He states the pain does not feel like a gout flare.  He states he has not had a gout flare in over 7 years.  He has not needed to take colchicine.  He has not tried ice or any over-the-counter products for pain relief.  Overall his pain has been manageable.    Activities of Daily Living:  Patient reports morning stiffness for 30 minutes.   Patient Denies nocturnal pain.  Difficulty dressing/grooming: Denies Difficulty climbing stairs: Denies Difficulty getting out of chair: Denies Difficulty using hands for taps, buttons, cutlery, and/or writing: Denies  Review of Systems  Constitutional:  Positive for fatigue.  HENT:  Negative for mouth sores, mouth dryness and nose dryness.   Eyes:  Negative for pain, itching and dryness.  Respiratory:  Negative for shortness of breath and difficulty breathing.   Cardiovascular:  Negative for chest pain and palpitations.  Gastrointestinal:  Negative for blood in stool, constipation and diarrhea.  Endocrine: Negative for increased urination.  Genitourinary:  Negative for difficulty urinating.  Musculoskeletal:  Positive for joint pain, joint pain,  joint swelling, myalgias, morning stiffness, muscle tenderness and myalgias.  Skin:  Negative for color change, rash and redness.  Allergic/Immunologic: Negative for susceptible to infections.  Neurological:  Positive for memory loss and weakness. Negative for dizziness, numbness and headaches.  Hematological:  Negative for bruising/bleeding tendency.  Psychiatric/Behavioral:  Negative for confusion.    PMFS History:  Patient Active Problem List   Diagnosis Date Noted   Esophageal dysphagia 05/14/2021   Abdominal pain, epigastric 05/14/2021   IBS (irritable bowel syndrome) 11/13/2020   Primary insomnia 09/26/2016   DJD (degenerative joint disease), cervical 09/26/2016   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   Abdominal pain, left lower quadrant 10/19/2012   GERD (gastroesophageal reflux disease) 04/15/2012   Gout 04/01/2011   Hypertension 04/01/2011   Elevated cholesterol with high triglycerides 04/01/2011    Past Medical History:  Diagnosis Date   Anxiety    GERD (gastroesophageal reflux disease)    Gout    Headache(784.0)    Hiatal hernia    High cholesterol    Hypertension    Skin cancer     Family History  Problem Relation Age of Onset   Aneurysm Father    Asthma Child    Colon cancer Neg Hx    Past Surgical History:  Procedure Laterality Date   APPENDECTOMY     BIOPSY  05/30/2021   Procedure: BIOPSY;  Surgeon: Rogene Houston, MD;  [facility]: [facility];  Service: Endoscopy;;   COLONOSCOPY N/A 10/23/2012   Procedure: COLONOSCOPY;  Surgeon: Rogene Houston, MD;  [facility]: [facility];  Service: Endoscopy;  Laterality: N/A;  235   ESOPHAGEAL DILATION N/A 05/30/2021   Procedure: ESOPHAGEAL DILATION;  Surgeon: Rogene Houston, MD;  [facility]: [facility];  Service: Endoscopy;  Laterality: N/A;   ESOPHAGOGASTRODUODENOSCOPY  04/17/2011   Procedure: ESOPHAGOGASTRODUODENOSCOPY (EGD);  Surgeon: Rogene Houston,  MD;  [facility]: [facility];  Service: Endoscopy;  Laterality: N/A;  8:30   ESOPHAGOGASTRODUODENOSCOPY (EGD) WITH PROPOFOL N/A 05/30/2021   Procedure: ESOPHAGOGASTRODUODENOSCOPY (EGD) WITH PROPOFOL;  Surgeon: Rogene Houston, MD;  [facility]: [facility];  Service: Endoscopy;  Laterality: N/A;  7:30   HAND SURGERY  05/28/2016   INGUINAL HERNIA REPAIR Left 02/17/2013   Procedure: HERNIA REPAIR INGUINAL ADULT;  Surgeon: Jamesetta So, MD;  Location: [facility];  Service: General;  Laterality: Left;   INSERTION OF MESH Left 02/17/2013   Procedure: INSERTION OF MESH;  Surgeon: Jamesetta So, MD;  Location: [facility];  Service: General;  Laterality: Left;   SINOSCOPY     sinus sugery     SKIN CANCER EXCISION     x4   SQUAMOUS CELL CARCINOMA EXCISION Left 2023   forearm   Social History   Social History Narrative   Lives at home with daughter.  Retired.  Education 12th grade.  Children 2.     Immunization History  Administered Date(s) Administered   PFIZER(Purple Top)SARS-COV-2 Vaccination 08/02/2019, 08/23/2019, 06/22/2020     Objective: Vital Signs: BP 135/81 (BP Location: Right Arm, Patient Position: Sitting, Cuff Size: Normal)    Pulse 69    Ht 5\' 10"  (1.778 m)    Wt 165 lb 12.8 oz (75.2 kg)    BMI 23.79 kg/m    Physical Exam Vitals and nursing note reviewed.  Constitutional:      Appearance: He is well-developed.  HENT:     Head: Normocephalic and atraumatic.  Eyes:     Conjunctiva/sclera: Conjunctivae normal.     Pupils: Pupils are equal, round, and reactive to light.  Cardiovascular:     Rate and Rhythm: Normal rate and regular rhythm.     Heart sounds: Normal heart sounds.  Pulmonary:     Effort: Pulmonary effort is normal.     Breath sounds: Normal breath sounds.  Abdominal:     General: Bowel sounds are normal.     Palpations: Abdomen is soft.  Musculoskeletal:     Cervical back: Normal range of motion and neck supple.  Skin:    General: Skin is warm and  dry.     Capillary Refill: Capillary refill takes less than 2 seconds.  Neurological:     Mental Status: He is alert and oriented to person, place, and time.  Psychiatric:        Behavior: Behavior normal.     Musculoskeletal Exam: C-spine, thoracic spine, lumbar spine have good range of motion.  Shoulder joints, elbow joints, wrist joints, MCPs, PIPs, DIPs have good range of motion with no synovitis.  She has PIP and DIP thickening consistent with osteoarthritis of both hands.  Hip joints have good range of motion with no groin pain.  He has warmth and swelling in the right knee joint.  Left knee has good range of motion with no discomfort.  Ankle joints have good range of motion with no tenderness or joint  swelling.  CDAI Exam: CDAI Score: -- Patient Global: --; Provider Global: -- Swollen: --; Tender: -- Joint Exam 07/12/2021   No joint exam has been documented for this visit   There is currently no information documented on the homunculus. Go to the Rheumatology activity and complete the homunculus joint exam.  Investigation: No additional findings.  Imaging: CT ANGIO HEAD W OR WO CONTRAST  Result Date: 06/27/2021 CLINICAL DATA:  Family history of brain aneurysm EXAM: CT ANGIOGRAPHY HEAD TECHNIQUE: Multidetector CT imaging of the head was performed using the standard protocol during bolus administration of intravenous contrast. Multiplanar CT image reconstructions and MIPs were obtained to evaluate the vascular anatomy. RADIATION DOSE REDUCTION: This exam was performed according to the departmental dose-optimization program which includes automated exposure control, adjustment of the mA and/or kV according to patient size and/or use of iterative reconstruction technique. CONTRAST:  118mL OMNIPAQUE IOHEXOL 350 MG/ML SOLN COMPARISON:  CT head 11/24/2019 FINDINGS: CT HEAD Brain: There is no evidence of acute intracranial hemorrhage, extra-axial fluid collection, or acute infarct.  Parenchymal volume is normal. The ventricles are normal in size. There is no significant burden of white matter microangiopathic change. There is no mass lesion. There is no midline shift. Vascular: There is mild calcification of the intracranial ICAs. Skull: Normal. Negative for fracture or focal lesion. Sinuses: Postsurgical changes are noted in the right maxillary sinus. There is mild mucosal thickening with associated hyperostosis consistent with sequela of chronic sinusitis. Other: The globes and orbits are unremarkable. CTA HEAD Anterior circulation: There is minimal calcified atherosclerotic plaque of the intracranial ICAs without hemodynamically significant stenosis or occlusion. The bilateral MCAs are patent. The bilateral ACAs are patent. The anterior communicating artery is normal. There is no aneurysm or AVM. Posterior circulation: The bilateral V4 segments are patent. PICA is identified bilaterally. The basilar artery is patent. The bilateral PCAs are patent. There is no aneurysm or AVM. Venous sinuses: Not well evaluated due to bolus timing. Anatomic variants: None. Review of the MIP images confirms the above findings. IMPRESSION: Unremarkable CT/CTA of the head.  No aneurysm identified. Electronically Signed   By: Valetta Mole M.D.   On: 06/27/2021 13:08   CT CORONARY MORPH W/CTA COR W/SCORE W/CA W/CM &/OR WO/CM  Addendum Date: 06/26/2021   ADDENDUM REPORT: 06/26/2021 13:33 CLINICAL DATA:  71 yo male with chest pain EXAM: Cardiac/Coronary CTA TECHNIQUE: A non-contrast, gated CT scan was obtained with axial slices of 3 mm through the heart for calcium scoring. Calcium scoring was performed using the Agatston method. A 120 kV prospective, gated, contrast cardiac scan was obtained. Gantry rotation speed was 250 msecs and collimation was 0.6 mm. Two sublingual nitroglycerin tablets (0.8 mg) were given. The 3D data set was reconstructed in 5% intervals of the 35-75% of the R-R cycle. Diastolic phases  were analyzed on a dedicated workstation using MPR, MIP, and VRT modes. The patient received 95 cc of contrast. FINDINGS: Image quality: Excellent. Noise artifact is: Limited. Coronary Arteries:  Normal coronary origin.  Right dominance. Left main: No LM noted; the LAD and Lcx arise from separate ostia. Left anterior descending artery: The LAD has mild (25-49; low end) calcified plaque in the proximal vessel followed by minimal (0-24) plaque in the mid vessel. The LAD gives off 2 patent diagonal branches. Left circumflex artery: The LCX is non-dominant and patent with no evidence of plaque or stenosis. The LCX gives off 3 patent obtuse marginal branches and terminates in smaller posterolateral. Right coronary artery:  The RCA is dominant with normal take off from the right coronary cusp. There is no evidence of plaque or stenosis. The RCA terminates as a PDA and right posterolateral branch without evidence of plaque or stenosis. Right Atrium: Right atrial size is within normal limits. Right Ventricle: The right ventricular cavity is within normal limits. Left Atrium: Left atrial size is normal in size with no left atrial appendage filling defect. Left Ventricle: The ventricular cavity size is within normal limits. There are no stigmata of prior infarction. There is no abnormal filling defect. Pulmonary arteries: Normal in size without proximal filling defect. Pulmonary veins: Normal pulmonary venous drainage. Pericardium: Normal thickness with no significant effusion or calcium present. Cardiac valves: The aortic valve is trileaflet without significant calcification. The mitral valve is normal structure without significant calcification. Aorta: Normal caliber with aortic atherosclerosis. Extra-cardiac findings: See attached radiology report for non-cardiac structures. IMPRESSION: 1. Coronary calcium score of 100. This was 40 percentile for age-, sex, and race-matched controls. 2. Normal coronary origin with right  dominance. 3. 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 4. Aortic atherosclerosis. RECOMMENDATIONS: CAD-RADS 2: Mild non-obstructive CAD (25-49%). Consider non-atherosclerotic causes of chest pain. Consider preventive therapy and risk factor modification. Kirk Ruths, MD Electronically Signed   By: Kirk Ruths M.D.   On: 06/26/2021 13:33   Result Date: 06/26/2021 EXAM: OVER-READ INTERPRETATION  CT CHEST The following report is an over-read performed by radiologist Dr. Markus Daft of [facility], [place] on 06/26/2021. This over-read does not include interpretation of cardiac or coronary anatomy or pathology. The coronary calcium score/coronary CTA interpretation by the cardiologist is attached. COMPARISON:  Chest CT 11/24/2019 FINDINGS: Vascular: Normal caliber of the visualized thoracic aorta. Normal appearance of the main pulmonary arteries. Mediastinum/Nodes: Visualized mediastinal structures are normal. Lungs/Pleura: Visualized lungs are clear. No large pleural effusions. Upper Abdomen: Images of the upper abdomen are unremarkable. Musculoskeletal: No acute abnormality. IMPRESSION: Negative over-read exam.  No acute extracardiac findings. Electronically Signed: By: Markus Daft M.D. On: 06/26/2021 12:45    Recent Labs: Lab Results  Component Value Date   WBC 6.1 01/09/2021   HGB 13.9 01/09/2021   PLT 192 01/09/2021   NA 136 06/20/2021   K 4.1 06/20/2021   CL 100 06/20/2021   CO2 26 06/20/2021   GLUCOSE 103 (H) 06/20/2021   BUN 10 06/20/2021   CREATININE 1.02 06/20/2021   BILITOT 0.4 01/09/2021   ALKPHOS 71 09/26/2016   AST 26 01/09/2021   ALT 22 01/09/2021   PROT 6.7 01/09/2021   ALBUMIN 4.2 09/26/2016   CALCIUM 8.8 (L) 06/20/2021   GFRAA 75 07/11/2020    Speciality Comments: No specialty comments available.  Procedures:  Large Joint Inj: R knee on 07/12/2021 9:11 AM Indications: pain Details: 22 G 1.5 in needle, medial approach  Arthrogram:  No  Medications: 3 mL lidocaine 1 %; 40 mg triamcinolone acetonide 40 MG/ML Aspirate: 10 mL clear; sent for lab analysis Outcome: tolerated well, no immediate complications Procedure, treatment alternatives, risks and benefits explained, specific risks discussed. Consent was given by the patient. Immediately prior to procedure a time out was called to verify the correct patient, procedure, equipment, support staff and site/side marked as required. Patient was prepped and draped in the usual sterile fashion.    Allergies: Codeine   Assessment / Plan:     Visit Diagnoses: Idiopathic chronic gout of multiple sites with tophus: He has not had any signs or symptoms of  a gout flare.  He reports his last gout flare was over 7 years ago.  He is currently taking allopurinol 200 mg daily.  Several months ago he reduced the dose of allopurinol on his own 200 mg daily but with his most recent lab work Dr. Nevada Crane advised him to increase back to 200 mg daily which she has been taking for the past 1 month.  He has not needed to take colchicine for many years.  Uric acid was 6.0 on 01/09/2021.  Uric acid was rechecked on 06/15/2021 ordered by Dr. Nevada Crane. We will call to obtain these results.   He presented today with pain and effusion in the right knee joint.  His symptoms started 2 to 3 months ago when he was standing on flat ground but twisted his right knee.  His symptoms have not been typical of a gout flare.  10 mL of synovial fluid was aspirated and was sent for synovial analysis.  The right knee joint was injected with cortisone and the procedure note was completed above.  He will be notified of the synovial analysis results once complete. He will remain on the current dose of allopurinol.  He will follow-up in the office in 6 months or sooner if necessary.  Medication management -He is taking Allopurinol 200 mg by mouth daily.  He has not needed to take mitigare 0.6 mg for many years.  CBC and CMP were updated on  06/15/2021.  We will call to obtain these records from Dr. Juel Burrow office.  Chronic left shoulder pain: Resolved.  He has good ROM of the left shoulder joint with no discomfort.   Primary osteoarthritis of both feet: He is not experiencing any discomfort in his feet at this time.  He is wearing proper fitting shoes.  Chronic pain of right knee -He presents today with increased pain in the right knee which started 2-3 months ago.  According to the patient he was standing on flat ground but twisted his right knee and has had intermittent discomfort since then.  He is also noticed intermittent swelling in the right knee.  His discomfort is most severe when kneeling.  He has had increased pain behind his right knee.  He is not currently experiencing any mechanical symptoms.  He has good range of motion of the right knee joint on examination today with warmth and swelling.  Different treatment options were discussed.  X-rays of the right knee were performed.  A right knee joint aspiration was attempted today in the office and 10 ml of fluid was sent for synovial analysis.  The right knee joint was injected with cortisone as requested.  He tolerated the procedure well.  Aftercare was discussed.  He was advised to notify us if his symptoms persist or worsen.  Plan: XR KNEE 3 VIEW RIGHT  DDD (degenerative disc disease), cervical: He has slightly limited ROM of the C-spine with crepitus.  No symptoms of radiculopathy at this time.   Other medical conditions are listed as follows:   Primary insomnia  History of gastroesophageal reflux (GERD)  History of hypertension  History of hyperlipidemia  Orders: Orders Placed This Encounter  Procedures   Large Joint Inj: R knee   XR KNEE 3 VIEW RIGHT   Synovial Fluid Analysis, Complete   No orders of the defined types were placed in this encounter.    Follow-Up Instructions: Return in about 6 months (around 01/09/2022) for Gout, Osteoarthritis,  DDD.   Ofilia Neas, PA-C  Note - This record has been created using [employer].  Chart creation errors have been sought, but may not always  have been located. Such creation errors do not reflect on  the standard of medical care.

## 2021-07-04 ENCOUNTER — Ambulatory Visit (INDEPENDENT_AMBULATORY_CARE_PROVIDER_SITE_OTHER): Payer: Medicare Other

## 2021-07-04 DIAGNOSIS — J309 Allergic rhinitis, unspecified: Secondary | ICD-10-CM | POA: Diagnosis not present

## 2021-07-06 ENCOUNTER — Ambulatory Visit (INDEPENDENT_AMBULATORY_CARE_PROVIDER_SITE_OTHER): Payer: Medicare Other

## 2021-07-06 DIAGNOSIS — J309 Allergic rhinitis, unspecified: Secondary | ICD-10-CM

## 2021-07-12 ENCOUNTER — Encounter: Payer: Self-pay | Admitting: Physician Assistant

## 2021-07-12 ENCOUNTER — Other Ambulatory Visit: Payer: Self-pay

## 2021-07-12 ENCOUNTER — Ambulatory Visit (INDEPENDENT_AMBULATORY_CARE_PROVIDER_SITE_OTHER): Payer: Medicare Other | Admitting: Physician Assistant

## 2021-07-12 ENCOUNTER — Ambulatory Visit: Payer: Self-pay

## 2021-07-12 VITALS — BP 135/81 | HR 69 | Ht 70.0 in | Wt 165.8 lb

## 2021-07-12 DIAGNOSIS — M25561 Pain in right knee: Secondary | ICD-10-CM | POA: Diagnosis not present

## 2021-07-12 DIAGNOSIS — Z8719 Personal history of other diseases of the digestive system: Secondary | ICD-10-CM

## 2021-07-12 DIAGNOSIS — Z8679 Personal history of other diseases of the circulatory system: Secondary | ICD-10-CM

## 2021-07-12 DIAGNOSIS — M19072 Primary osteoarthritis, left ankle and foot: Secondary | ICD-10-CM

## 2021-07-12 DIAGNOSIS — Z79899 Other long term (current) drug therapy: Secondary | ICD-10-CM | POA: Diagnosis not present

## 2021-07-12 DIAGNOSIS — M5030 Other cervical disc degeneration, unspecified cervical region: Secondary | ICD-10-CM

## 2021-07-12 DIAGNOSIS — Z8639 Personal history of other endocrine, nutritional and metabolic disease: Secondary | ICD-10-CM | POA: Diagnosis not present

## 2021-07-12 DIAGNOSIS — G8929 Other chronic pain: Secondary | ICD-10-CM

## 2021-07-12 DIAGNOSIS — M19071 Primary osteoarthritis, right ankle and foot: Secondary | ICD-10-CM

## 2021-07-12 DIAGNOSIS — M1A09X1 Idiopathic chronic gout, multiple sites, with tophus (tophi): Secondary | ICD-10-CM

## 2021-07-12 DIAGNOSIS — F5101 Primary insomnia: Secondary | ICD-10-CM

## 2021-07-12 DIAGNOSIS — M25461 Effusion, right knee: Secondary | ICD-10-CM | POA: Diagnosis not present

## 2021-07-12 DIAGNOSIS — M25512 Pain in left shoulder: Secondary | ICD-10-CM | POA: Diagnosis not present

## 2021-07-12 LAB — SYNOVIAL FLUID ANALYSIS, COMPLETE
Basophils, %: 0 %
Eosinophils-Synovial: 0 % (ref 0–2)
Lymphocytes-Synovial Fld: 84 % — ABNORMAL HIGH (ref 0–74)
Monocyte/Macrophage: 13 % (ref 0–69)
Neutrophil, Synovial: 2 % (ref 0–24)
Synoviocytes, %: 1 % (ref 0–15)
WBC, Synovial: 136 cells/uL (ref <150)

## 2021-07-12 MED ORDER — TRIAMCINOLONE ACETONIDE 40 MG/ML IJ SUSP
40.0000 mg | INTRAMUSCULAR | Status: AC | PRN
  Administered 2021-07-12: 40 mg via INTRA_ARTICULAR

## 2021-07-12 MED ORDER — LIDOCAINE HCL 1 % IJ SOLN
3.0000 mL | INTRAMUSCULAR | Status: AC | PRN
  Administered 2021-07-12: 3 mL

## 2021-07-12 NOTE — Progress Notes (Signed)
No crystals seen.  Lymphocytes are borderline elevated consistent with inflammation.

## 2021-07-13 ENCOUNTER — Ambulatory Visit (INDEPENDENT_AMBULATORY_CARE_PROVIDER_SITE_OTHER): Payer: Medicare Other | Admitting: *Deleted

## 2021-07-13 DIAGNOSIS — J309 Allergic rhinitis, unspecified: Secondary | ICD-10-CM | POA: Diagnosis not present

## 2021-07-17 ENCOUNTER — Ambulatory Visit (INDEPENDENT_AMBULATORY_CARE_PROVIDER_SITE_OTHER): Payer: Medicare Other | Admitting: Internal Medicine

## 2021-07-17 ENCOUNTER — Other Ambulatory Visit: Payer: Self-pay

## 2021-07-17 ENCOUNTER — Encounter (INDEPENDENT_AMBULATORY_CARE_PROVIDER_SITE_OTHER): Payer: Self-pay | Admitting: Internal Medicine

## 2021-07-17 VITALS — BP 138/81 | HR 64 | Temp 97.4°F | Ht 70.0 in | Wt 164.9 lb

## 2021-07-17 DIAGNOSIS — K219 Gastro-esophageal reflux disease without esophagitis: Secondary | ICD-10-CM | POA: Diagnosis not present

## 2021-07-17 DIAGNOSIS — K200 Eosinophilic esophagitis: Secondary | ICD-10-CM

## 2021-07-17 MED ORDER — PANTOPRAZOLE SODIUM 40 MG PO TBEC: 40.0000 mg | DELAYED_RELEASE_TABLET | Freq: Every day | ORAL | 3 refills | Status: DC

## 2021-07-17 NOTE — Patient Instructions (Signed)
Can try pantoprazole every other day.  If it does not work he can go back to taking it daily. Please call office with progress report in 3 months. Notify of dysphagia recurs.

## 2021-07-17 NOTE — Progress Notes (Signed)
Presenting complaint;  Follow-up for eosinophilic esophagitis and GERD.  Database and Subjective:  Patient is 71 year old Caucasian male who is here for scheduled visit.  He has a history of chronic GERD and distal esophageal stricture.  His first EGD dilation was in May 2014.  Esophageal biopsy was negative for eosinophilic esophagitis. He presented back in November for epigastric pain and intermittent dysphagia to solids and pills.  He underwent EGD on 05/30/2021.  He had typical mucosal changes of EOE confirmed on biopsy.  Distal esophageal stricture was dilated with balloon to 16.5 mm.  He also had gastric polyps.  Formal biopsy done revealed fundic gland polyps without esophageal metaplasia.  Antral biopsy was negative for H. pylori infection.  Patient was treated with fluticasone for about 6 weeks.  Patient says he has not had any swallowing difficulty for the last 4 weeks.  He also rarely experiences heartburn.  He feels pantoprazole is working.  He is slowly and chews his food thoroughly.  He eats his supper at least 3 to 4 hours before he goes to bed.  He is not having any more abdominal pain.  He denies nausea vomiting melena or rectal bleeding.  His bowels generally move daily. He says he had head CT because of family history of brain aneurysm and was negative.  He also had coronary CT which revealed score of 100.  He has follow-up visit with Dr. Oswaldo Milian of cardiology. He also tells me that he had fluid removed from his right knee by Dr. Estanislado Pandy  5 days ago and fluid was negative for urate crystals.  He states he has not had attack of gout 6 years.  He is on allopurinol.  He has felt to have fluid due to osteoarthrosis.   His last screening colonoscopy was in May 2014 with removal of 3 mm tubular adenoma.  He wanted to wait 10 years.  He will be due for an exam in May next year.  Current Medications: Outpatient Encounter Medications as of 07/17/2021  Medication Sig    allopurinol (ZYLOPRIM) 100 MG tablet Take 2 tablets (200 mg total) by mouth daily.   cyclobenzaprine (FLEXERIL) 5 MG tablet Take 5 mg by mouth daily as needed for muscle spasms (pinched nerve).   EPINEPHrine 0.3 mg/0.3 mL IJ SOAJ injection Inject 0.3 mg into the muscle as needed for anaphylaxis.   ezetimibe (ZETIA) 10 MG tablet Take 10 mg by mouth daily.   fluticasone (FLONASE) 50 MCG/ACT nasal spray Place 2 sprays into both nostrils daily as needed for allergies.   levocetirizine (XYZAL) 5 MG tablet Take 5 mg by mouth daily.   lisinopril (PRINIVIL,ZESTRIL) 10 MG tablet Take 10 mg by mouth daily.   pantoprazole (PROTONIX) 40 MG tablet Take 1 tablet (40 mg total) by mouth daily.   PRESCRIPTION MEDICATION Allergy injections at the allergist office   zolpidem (AMBIEN) 10 MG tablet Take 10 mg by mouth at bedtime.   fluticasone (FLOVENT HFA) 220 MCG/ACT inhaler Inhale 4 puffs into the lungs 2 (two) times daily. (Patient not taking: Reported on 07/17/2021)   [DISCONTINUED] metoprolol tartrate (LOPRESSOR) 100 MG tablet Take 1 tablet two (2) hours prior to CT Scan (Patient not taking: Reported on 07/12/2021)   No facility-administered encounter medications on file as of 07/17/2021.     Objective: Blood pressure 138/81, pulse 64, temperature (!) 97.4 F (36.3 C), temperature source Oral, height 5\' 10"  (1.778 m), weight 164 lb 14.4 oz (74.8 kg). Patient is alert and in no  acute distress. Conjunctiva is pink. Sclera is nonicteric Oropharyngeal mucosa is normal. Dentition in satisfactory condition. No neck masses or thyromegaly noted. Cardiac exam with regular rhythm normal S1 and S2. No murmur or gallop noted. Lungs are clear to auscultation. Abdomen is soft and nontender with organomegaly or masses. No LE edema or clubbing noted.  Labs/studies Results:  Lab data from 06/15/2021(Dr. [place] office)  WBC 6.3, H&H 13.6 and 39.0, platelet count 205K.  Glucose 94, BUN 7, creatinine 1.08 Serum  sodium 140, potassium 4.8, chloride 102, CO2 24 Serum calcium 9.4 Bilirubin 0.6, AP 74, AST 28, ALT 21, total protein 6.8 and albumin four-point.  Uric acid 6.8 Hemoglobin A1c 5.4.   Assessment:  #1.  Chronic GERD.  He has had symptoms for several years.  Heartburn is well controlled with dietary measures and PPI.  Therefore we will trial him on every other day PPI and see how it works before prescription changed.  #2.  Eosinophilic esophagitis.  He has been treated with pantoprazole for about 6 weeks and dysphagia has resolved.  Distal esophageal stricture was only dilated to 16.5 mm.  If dysphagia recurs within the next few months will consider barium study prior to redilation and if dysphagia recurs after several months or a year will retreat him before considering EGD.  #3.  Patient will be due for colonoscopy next year.  Last exam was in May 2014 with removal of 3 mm tubular adenoma and was decided to wait 10 years instead of 7.   Plan:  Continue antireflux measures as before. Pantoprazole prescription sent to patient's pharmacy for 90 doses with 3 refills. However he will try it every other day and see if it works. He will call with progress report in 3 months or earlier if he has dysphagia. Office visit in 1 year.

## 2021-07-18 ENCOUNTER — Ambulatory Visit (INDEPENDENT_AMBULATORY_CARE_PROVIDER_SITE_OTHER): Payer: Medicare Other | Admitting: *Deleted

## 2021-07-18 DIAGNOSIS — J309 Allergic rhinitis, unspecified: Secondary | ICD-10-CM

## 2021-07-20 ENCOUNTER — Encounter (INDEPENDENT_AMBULATORY_CARE_PROVIDER_SITE_OTHER): Payer: Self-pay

## 2021-07-25 ENCOUNTER — Ambulatory Visit (INDEPENDENT_AMBULATORY_CARE_PROVIDER_SITE_OTHER): Payer: Medicare Other

## 2021-07-25 DIAGNOSIS — J309 Allergic rhinitis, unspecified: Secondary | ICD-10-CM | POA: Diagnosis not present

## 2021-07-26 ENCOUNTER — Ambulatory Visit (INDEPENDENT_AMBULATORY_CARE_PROVIDER_SITE_OTHER): Payer: Medicare Other | Admitting: Pharmacist Clinician (PhC)/ Clinical Pharmacy Specialist

## 2021-07-26 ENCOUNTER — Encounter: Payer: Self-pay | Admitting: Pharmacist Clinician (PhC)/ Clinical Pharmacy Specialist

## 2021-07-26 ENCOUNTER — Other Ambulatory Visit: Payer: Self-pay

## 2021-07-26 DIAGNOSIS — E782 Mixed hyperlipidemia: Secondary | ICD-10-CM

## 2021-07-26 MED ORDER — ROSUVASTATIN CALCIUM 10 MG PO TABS: ORAL_TABLET | ORAL | 3 refills | Status: DC

## 2021-07-26 NOTE — Assessment & Plan Note (Signed)
ASCVD 10-year risk of 16.9%. Last LDL 125, TG 294. Discussed treatment options including statin retrial and PCSK9s along with potential side effects. Patient was amenable to trying modified dosage of Rosuvastatin initially. Reviewed triglycerides and offered dietary recommendations. Will continue to monitor.   Start CoQ 10 (200-300 mcg) daily   Start Rosuvastatin 10 mg on M, W, F (start after 3-4 weeks of CoQ10)   Recommend DASH diet and reduced alcohol intake  Encouraged to call if experiencing complications with Rosuvastatin  Plan to revisit PCSK9 at future appointments

## 2021-07-26 NOTE — Progress Notes (Signed)
07/26/2021 Dennis Miles [birth year] [id number]   TB is a 71 YOM patient of Dr. Gardiner Rhyme coming to the clinic today for hyperlipidemia management. He is currently only on Zetia 47m daily and his LDL has dropped from 139 in September 2022 to 125 last month (06/17/21). His calculated 10-year ASCVD risk score is 16.9% using BP average from his last 4 visits. No history of statin use on file, but expressed intolerance due to myalgias at his visit with Dr. SGardiner Rhymeon 06/20/21. Reports history of myalgia with Lipitor and Crestor.      Labs (06/17/21): LDL 125, TG 294, HDL 54, TC 231, SCr 1.08, eGFR 75, AST/ALT 28/21    Coronary calcium score of 100 (49 percentile) per CT 06/26/21    Relevant Past Medical History:   HTN  Lisinopril   HDL  Ezetimibe   GERD  Pantoprazole   Gout  Allopurinol   IBS  -     The 10-year ASCVD risk score is: 16.9%  Age: 89 Sex: Male  Diabetic: No  Smoker: No  Systolic Blood Pressure: 1762(averaged)  Is BP treated: Yes   HDL Cholesterol: 54  Total Cholesterol: 231    FH: aneurysm in father, asthma in child    SH: does not drink, uses chewing tobacco    Current agents: Ezetimibe 112mdaily    Patient-reported dietary habits: denies eating a lot of fast food, starchy vegetables, pasta, beef; eats salads, fried and broiled fish, white bread sandwiches and vegetable; snacks on coca-cola, nabs    Patient-reported exercise habits: pretty active, on his feet outside all day if it's not raining     Current Outpatient Medications  Medication Sig Dispense Refill   allopurinol (ZYLOPRIM) 100 MG tablet Take 2 tablets (200 mg total) by mouth daily. 180 tablet 0   cyclobenzaprine (FLEXERIL) 5 MG tablet Take 5 mg by mouth daily as needed for muscle spasms (pinched nerve).     EPINEPHrine 0.3 mg/0.3 mL IJ SOAJ injection Inject 0.3 mg into the muscle as needed for anaphylaxis.     ezetimibe (ZETIA) 10 MG tablet Take 10 mg by mouth daily.     fluticasone (FLONASE) 50  MCG/ACT nasal spray Place 2 sprays into both nostrils daily as needed for allergies.     levocetirizine (XYZAL) 5 MG tablet Take 5 mg by mouth daily.     lisinopril (PRINIVIL,ZESTRIL) 10 MG tablet Take 10 mg by mouth daily.     pantoprazole (PROTONIX) 40 MG tablet Take 1 tablet (40 mg total) by mouth daily before breakfast. 90 tablet 3   PRESCRIPTION MEDICATION Allergy injections at the allergist office     rosuvastatin (CRESTOR) 10 MG tablet Take 1 tablet by mouth three times weekly. 36 tablet 3   zolpidem (AMBIEN) 10 MG tablet Take 10 mg by mouth at bedtime.     No current facility-administered medications for this visit.    Allergies  Allergen Reactions   Crestor [Rosuvastatin]     myalgias   Lipitor [Atorvastatin]     Myalgias    Zetia [Ezetimibe]     myalgias   Codeine Nausea Only    Past Medical History:  Diagnosis Date   Anxiety    GERD (gastroesophageal reflux disease)    Gout    Headache(784.0)    Hiatal hernia    High cholesterol    Hypertension    Skin cancer     Blood pressure (!) 144/92, pulse 61, resp. rate 14, height 5' 10.5" (1.791  m), weight 160 lb 3.2 oz (72.7 kg), SpO2 93 %.   Elevated cholesterol with high triglycerides ASCVD 10-year risk of 16.9%. Last LDL 125, TG 294. Discussed treatment options including statin retrial and PCSK9s along with potential side effects. Patient was amenable to trying modified dosage of Rosuvastatin initially. Reviewed triglycerides and offered dietary recommendations. Will continue to monitor.   Start CoQ 10 (200-300 mcg) daily   Start Rosuvastatin 10 mg on M, W, F (start after 3-4 weeks of CoQ10)   Recommend DASH diet and reduced alcohol intake  Encouraged to call if experiencing complications with Rosuvastatin  Plan to revisit PCSK9 at future appointments   [employer] 2023  I was with student and patient for entire appointment and agree with above assessment and  plan.  Tommy Medal PharmD CPP [facility] [address] [place], [place] [address] [phone]

## 2021-07-26 NOTE — Patient Instructions (Addendum)
Your Results:             Your most recent labs Goal  Total Cholesterol 231 < 200  Triglycerides 294 < 150  HDL (happy/good cholesterol) 54 > 40  LDL (lousy/bad cholesterol 125 < 70    Medication changes:  Start CoQ10 (200-300 mcg) daily (we prefer you use [employer] brand if possible).  After 3-4 weeks then start rosuvastatin 10 mg three times weekly.  If you can tolerate this for 2-3 months, then we will have you repeat cholesterol labs.     Thank you for choosing [facility]   High Triglycerides Eating Plan Triglycerides are a type of fat in the blood. High levels of triglycerides can increase your risk of heart disease and stroke. If your triglyceride levels are high, choosing the right foods can help lower your triglycerides and keep your heart healthy. Work with your health care provider or a dietitian to develop an eating plan that is right for you. What are tips for following this plan? General guidelines  Lose weight, if you are overweight. For most people, losing 5-10 lb (2-5 kg) helps lower triglyceride levels. A weight-loss plan may include: 30 minutes of exercise at least 5 days a week. Reducing the amount of calories, sugar, and fat you eat. Eat a wide variety of fresh fruits, vegetables, and whole grains. These foods are high in fiber. Eat foods that contain healthy fats, such as fatty fish, nuts, seeds, and olive oil. Avoid foods that are high in added sugar, added salt (sodium), and saturated fat. Avoid low-fiber, refined carbohydrates such as white bread, crackers, noodles, and white rice. Avoid foods with trans fats or partially hydrogenated oils, such as fried foods or stick margarine. If you drink alcohol: Limit how much you have to: 0-1 drink a day for women who are not pregnant. 0-2 drinks a day for men. Your health care provider may recommend that you drink less than these amounts depending on your overall health. Know how much alcohol is in a drink. In the  [place] one drink equals one 12 oz bottle of beer (355 mL), one 5 oz glass of wine (148 mL), or one 1 oz glass of hard liquor (44 mL). Reading food labels Check food labels for: The amount of saturated fat. Choose foods with no or very little saturated fat (less than 2 g). The amount of trans fat. Choose foods with no transfat. The amount of cholesterol. Choose foods that are low in cholesterol. The amount of sodium. Choose foods with less than 140 milligrams (mg) per serving. Shopping Buy dairy products labeled as nonfat (skim) or low-fat (1%). Avoid buying processed or prepackaged foods. These are often high in added sugar, sodium, and fat. Cooking Choose healthy fats when cooking, such as olive oil, avocado oil, or canola oil. Cook foods using lower fat methods, such as baking, broiling, boiling, or grilling. Make your own sauces, dressings, and marinades when possible, instead of buying them. Store-bought sauces, dressings, and marinades are often high in sodium and sugar. Meal planning Eat more home-cooked food and less restaurant, buffet, and fast food. Eat fatty fish at least 2 times each week. Examples of fatty fish include salmon, trout, sardines, mackerel, tuna, and herring. If you eat whole eggs, do not eat more than 4 egg yolks per week. What foods should I eat? Fruits All fresh, canned (in natural juice), or frozen fruits. Vegetables Fresh or frozen vegetables. Low-sodium canned vegetables. Grains Whole wheat or whole  grain breads, crackers, cereals, and pasta. Unsweetened oatmeal. Bulgur. Barley. Quinoa. Brown rice. Whole wheat flour tortillas. Meats and other proteins Skinless chicken or [place]. Ground chicken or [place]. Lean cuts of pork, trimmed of fat. Fish and seafood, especially salmon, trout, and herring. Egg whites. Dried beans, peas, or lentils. Unsalted nuts or seeds. Unsalted canned beans. Natural peanut or almond butter or other nut butters. Dairy Low-fat dairy  products. Skim or low-fat (1%) milk. Reduced fat (2%) and low-sodium cheese. Low-fat ricotta cheese. Low-fat cottage cheese. Plain, low-fat yogurt. Fats and oils Tub margarine without trans fats. Light or reduced-fat mayonnaise. Light or reduced-fat salad dressings. Avocado. Safflower, olive, sunflower, soybean, and canola oils. The items listed above may not be a complete list of recommended foods and beverages. Talk with your dietitian about what dietary choices are best for you. What foods should I avoid? Fruits Sweetened dried fruit. Canned fruit in syrup. Fruit juice. Vegetables Creamed or fried vegetables. Vegetables in a cheese sauce. Grains White bread. White (regular) pasta. White rice. Cornbread. Bagels. Pastries. Crackers that contain trans fat. Meats and other proteins Fatty cuts of meat. Ribs. Chicken wings. Berniece Salines. Sausage. Bologna. Salami. Chitterlings. Fatback. Hot dogs. Bratwurst. Packaged lunch meats. Dairy Whole or reduced-fat (2%) milk. Half-and-half. Cream cheese. Full-fat or sweetened yogurt. Full-fat cheese. Nondairy creamers. Whipped toppings. Processed cheese or cheese spreads. Cheese curds. Fats and oils Butter. Stick margarine. Lard. Shortening. Ghee. Bacon fat. Tropical oils, such as coconut, palm kernel, or palm oils. Beverages Alcohol. Sweetened drinks, such as soda, lemonade, fruit drinks, or punches. Sweets and desserts Corn syrup. Sugars. Honey. Molasses. Candy. Jam and jelly. Syrup. Sweetened cereals. Cookies. Pies. Cakes. Donuts. Muffins. Ice cream. Condiments Store-bought sauces, dressings, and marinades that are high in sugar, such as ketchup and barbecue sauce. The items listed above may not be a complete list of foods and beverages you should avoid. Talk with your dietitian about what dietary choices are best for you. Summary High levels of triglycerides can increase the risk of heart disease and stroke. Choosing the right foods can help lower your  triglycerides. Eat plenty of fresh fruits, vegetables, and whole grains. Choose low-fat dairy and lean meats. Eat fatty fish at least twice a week. Avoid processed and prepackaged foods with added sugar, sodium, saturated fat, and trans fat. If you need suggestions or have questions about what types of food are good for you, talk with your health care provider or a dietitian. This information is not intended to replace advice given to you by your health care provider. Make sure you discuss any questions you have with your health care provider. Document Revised: 09/29/2020 Document Reviewed: 09/29/2020 Elsevier Patient Education  [place].

## 2021-08-01 ENCOUNTER — Ambulatory Visit (INDEPENDENT_AMBULATORY_CARE_PROVIDER_SITE_OTHER): Payer: Medicare Other

## 2021-08-01 DIAGNOSIS — J309 Allergic rhinitis, unspecified: Secondary | ICD-10-CM

## 2021-08-03 ENCOUNTER — Ambulatory Visit (INDEPENDENT_AMBULATORY_CARE_PROVIDER_SITE_OTHER): Payer: Medicare Other

## 2021-08-03 DIAGNOSIS — J309 Allergic rhinitis, unspecified: Secondary | ICD-10-CM | POA: Diagnosis not present

## 2021-08-08 ENCOUNTER — Ambulatory Visit (INDEPENDENT_AMBULATORY_CARE_PROVIDER_SITE_OTHER): Payer: Medicare Other

## 2021-08-08 DIAGNOSIS — J309 Allergic rhinitis, unspecified: Secondary | ICD-10-CM | POA: Diagnosis not present

## 2021-08-10 ENCOUNTER — Ambulatory Visit (INDEPENDENT_AMBULATORY_CARE_PROVIDER_SITE_OTHER): Payer: Medicare Other | Admitting: *Deleted

## 2021-08-10 DIAGNOSIS — J309 Allergic rhinitis, unspecified: Secondary | ICD-10-CM | POA: Diagnosis not present

## 2021-08-17 ENCOUNTER — Ambulatory Visit (INDEPENDENT_AMBULATORY_CARE_PROVIDER_SITE_OTHER): Payer: Medicare Other

## 2021-08-17 DIAGNOSIS — J309 Allergic rhinitis, unspecified: Secondary | ICD-10-CM

## 2021-08-22 ENCOUNTER — Ambulatory Visit (INDEPENDENT_AMBULATORY_CARE_PROVIDER_SITE_OTHER): Payer: Medicare Other

## 2021-08-22 DIAGNOSIS — J309 Allergic rhinitis, unspecified: Secondary | ICD-10-CM | POA: Diagnosis not present

## 2021-08-31 ENCOUNTER — Ambulatory Visit (INDEPENDENT_AMBULATORY_CARE_PROVIDER_SITE_OTHER): Payer: Medicare Other

## 2021-08-31 DIAGNOSIS — J309 Allergic rhinitis, unspecified: Secondary | ICD-10-CM | POA: Diagnosis not present

## 2021-09-05 ENCOUNTER — Ambulatory Visit (INDEPENDENT_AMBULATORY_CARE_PROVIDER_SITE_OTHER): Payer: Medicare Other

## 2021-09-05 DIAGNOSIS — J309 Allergic rhinitis, unspecified: Secondary | ICD-10-CM

## 2021-09-12 ENCOUNTER — Ambulatory Visit (INDEPENDENT_AMBULATORY_CARE_PROVIDER_SITE_OTHER): Payer: Medicare Other

## 2021-09-12 DIAGNOSIS — J309 Allergic rhinitis, unspecified: Secondary | ICD-10-CM | POA: Diagnosis not present

## 2021-09-19 ENCOUNTER — Ambulatory Visit (INDEPENDENT_AMBULATORY_CARE_PROVIDER_SITE_OTHER): Payer: Medicare Other

## 2021-09-19 DIAGNOSIS — J309 Allergic rhinitis, unspecified: Secondary | ICD-10-CM | POA: Diagnosis not present

## 2021-09-28 ENCOUNTER — Ambulatory Visit (INDEPENDENT_AMBULATORY_CARE_PROVIDER_SITE_OTHER): Payer: Medicare Other

## 2021-09-28 DIAGNOSIS — J309 Allergic rhinitis, unspecified: Secondary | ICD-10-CM | POA: Diagnosis not present

## 2021-10-05 ENCOUNTER — Ambulatory Visit (INDEPENDENT_AMBULATORY_CARE_PROVIDER_SITE_OTHER): Payer: Medicare Other

## 2021-10-05 DIAGNOSIS — J309 Allergic rhinitis, unspecified: Secondary | ICD-10-CM

## 2021-10-10 ENCOUNTER — Ambulatory Visit (INDEPENDENT_AMBULATORY_CARE_PROVIDER_SITE_OTHER): Payer: Medicare Other

## 2021-10-10 DIAGNOSIS — J309 Allergic rhinitis, unspecified: Secondary | ICD-10-CM | POA: Diagnosis not present

## 2021-10-17 ENCOUNTER — Ambulatory Visit (INDEPENDENT_AMBULATORY_CARE_PROVIDER_SITE_OTHER): Payer: Medicare Other

## 2021-10-17 DIAGNOSIS — J309 Allergic rhinitis, unspecified: Secondary | ICD-10-CM | POA: Diagnosis not present

## 2021-10-18 DIAGNOSIS — J3081 Allergic rhinitis due to animal (cat) (dog) hair and dander: Secondary | ICD-10-CM | POA: Diagnosis not present

## 2021-10-18 NOTE — Progress Notes (Signed)
VIALS EXP 10-19-22

## 2021-10-19 DIAGNOSIS — J302 Other seasonal allergic rhinitis: Secondary | ICD-10-CM | POA: Diagnosis not present

## 2021-10-23 DIAGNOSIS — D485 Neoplasm of uncertain behavior of skin: Secondary | ICD-10-CM | POA: Diagnosis not present

## 2021-10-23 DIAGNOSIS — C44629 Squamous cell carcinoma of skin of left upper limb, including shoulder: Secondary | ICD-10-CM | POA: Diagnosis not present

## 2021-10-23 DIAGNOSIS — Z85828 Personal history of other malignant neoplasm of skin: Secondary | ICD-10-CM | POA: Diagnosis not present

## 2021-10-23 DIAGNOSIS — D2271 Melanocytic nevi of right lower limb, including hip: Secondary | ICD-10-CM | POA: Diagnosis not present

## 2021-10-23 DIAGNOSIS — D225 Melanocytic nevi of trunk: Secondary | ICD-10-CM | POA: Diagnosis not present

## 2021-10-23 DIAGNOSIS — L821 Other seborrheic keratosis: Secondary | ICD-10-CM | POA: Diagnosis not present

## 2021-10-23 DIAGNOSIS — L578 Other skin changes due to chronic exposure to nonionizing radiation: Secondary | ICD-10-CM | POA: Diagnosis not present

## 2021-10-23 DIAGNOSIS — L814 Other melanin hyperpigmentation: Secondary | ICD-10-CM | POA: Diagnosis not present

## 2021-10-23 DIAGNOSIS — L570 Actinic keratosis: Secondary | ICD-10-CM | POA: Diagnosis not present

## 2021-10-26 ENCOUNTER — Ambulatory Visit (INDEPENDENT_AMBULATORY_CARE_PROVIDER_SITE_OTHER): Payer: Medicare Other | Admitting: Family Medicine

## 2021-10-26 ENCOUNTER — Encounter: Payer: Self-pay | Admitting: Family Medicine

## 2021-10-26 ENCOUNTER — Ambulatory Visit: Payer: Self-pay

## 2021-10-26 VITALS — BP 122/90 | HR 72 | Temp 98.5°F | Resp 16 | Ht 68.11 in | Wt 164.0 lb

## 2021-10-26 DIAGNOSIS — J302 Other seasonal allergic rhinitis: Secondary | ICD-10-CM | POA: Diagnosis not present

## 2021-10-26 DIAGNOSIS — J309 Allergic rhinitis, unspecified: Secondary | ICD-10-CM

## 2021-10-26 DIAGNOSIS — K200 Eosinophilic esophagitis: Secondary | ICD-10-CM

## 2021-10-26 DIAGNOSIS — K219 Gastro-esophageal reflux disease without esophagitis: Secondary | ICD-10-CM

## 2021-10-26 MED ORDER — LEVOCETIRIZINE DIHYDROCHLORIDE 5 MG PO TABS: 5.0000 mg | ORAL_TABLET | Freq: Every day | ORAL | 5 refills | Status: DC | PRN

## 2021-10-26 MED ORDER — EPINEPHRINE 0.3 MG/0.3ML IJ SOAJ: 0.3000 mg | INTRAMUSCULAR | 1 refills | Status: DC | PRN

## 2021-10-26 MED ORDER — FLUTICASONE PROPIONATE 50 MCG/ACT NA SUSP: 2.0000 | Freq: Every day | NASAL | 5 refills | Status: DC | PRN

## 2021-10-26 NOTE — Progress Notes (Signed)
[place], [address] [place] [address] Dept: [phone]  FOLLOW UP NOTE  Patient ID: Dennis Miles, male    DOB: 12/01/1950  Age: 71 y.o. MRN: [id number] Date of Office Visit: 10/26/2021  Assessment  Chief Complaint: Allergic Rhinitis  (Says they are well. Still doing immunotherapy. Seldomly has sneezing fits but overall he is good. )  HPI Dennis Miles is a 71 year old male who presents the clinic for follow-up visit.  He was last seen in this clinic on 08/04/2020 by Dr. Ernst Bowler for evaluation of allergic rhinitis and allergic conjunctivitis.  At today's visit, he reports his allergic rhinitis has been much more well controlled while continuing on allergen immunotherapy.  He reports symptoms including nasal congestion which has improved, and intermittent sneezing.  He reports a resolution of sore throat and has not needed an antibiotic since his last visit to this clinic.  He continues Xyzal 5 mg once a day and uses Flonase as needed.  He is not currently using a nasal saline rinse.  He continues allergen immunotherapy with no larger local reactions.  He reports a significant decrease in his symptoms of allergic rhinitis while continuing on allergen immunotherapy.  He does have 1 dog that lives in his home and sleeps in his room.  Allergic conjunctivitis is reported as well controlled with occasional use of Nalfon he does report a chest discomfort that occurs intermittently and lasts for several days at a time.  His last EGD was on 05/30/2021 results are listed below. He continues to follow up with Dr. Hildred Laser at [facility] for [place]. His current medications are listed int the chart.  EGD 05/30/2021 - Normal hypopharynx. - Normal proximal esophagus. - Esophageal mucosal changes suspicious for eosinophilic esophagitis. Biopsied. - Benign-appearing esophageal stenosis. Chromoscopy performed. Dilated. - 2 cm hiatal hernia. - A few gastric polyps. Biopsied. -  Gastritis. Biopsied. - Normal duodenal bulb and second portion of the duodenum.  Drug Allergies:  Allergies  Allergen Reactions   Codeine Nausea Only   Crestor [Rosuvastatin] Other (See Comments)    myalgias   Lipitor [Atorvastatin] Other (See Comments)    Myalgias    Zetia [Ezetimibe] Other (See Comments)    myalgias    Physical Exam: BP 122/90   Pulse 72   Temp 98.5 F (36.9 C) (Temporal)   Resp 16   Ht 5' 8.11" (1.73 m)   Wt 164 lb (74.4 kg)   SpO2 97%   BMI 24.86 kg/m    Physical Exam Vitals reviewed.  Constitutional:      Appearance: Normal appearance.  HENT:     Head: Normocephalic and atraumatic.     Right Ear: Tympanic membrane normal.     Left Ear: Tympanic membrane normal.     Nose:     Comments: Bilateral nares slightly erythematous with clear nasal drainage. Pharynx slightly erythematous with no exudate noted. Ears normal. Eyes normal. Eyes:     Conjunctiva/sclera: Conjunctivae normal.  Cardiovascular:     Rate and Rhythm: Normal rate and regular rhythm.     Heart sounds: Normal heart sounds. No murmur heard. Pulmonary:     Effort: Pulmonary effort is normal.     Breath sounds: Normal breath sounds.     Comments: Lungs clear to auscultation Musculoskeletal:        General: Normal range of motion.     Cervical back: Normal range of motion and neck supple.  Skin:    General: Skin is warm  and dry.  Neurological:     Mental Status: He is alert and oriented to person, place, and time.  Psychiatric:        Mood and Affect: Mood normal.        Behavior: Behavior normal.        Thought Content: Thought content normal.        Judgment: Judgment normal.    Assessment and Plan: 1. Seasonal and perennial allergic rhinitis   2. Gastroesophageal reflux disease, unspecified whether esophagitis present   3. Eosinophilic esophagitis     Meds ordered this encounter  Medications   EPINEPHRINE 0.3 mg/0.3 mL IJ SOAJ injection    Sig: Inject 0.3 mg into the  muscle as needed for anaphylaxis.    Dispense:  2 each    Refill:  1   fluticasone (FLONASE) 50 MCG/ACT nasal spray    Sig: Place 2 sprays into both nostrils daily as needed for allergies.    Dispense:  16 g    Refill:  5   levocetirizine (XYZAL) 5 MG tablet    Sig: Take 1 tablet (5 mg total) by mouth daily as needed for allergies.    Dispense:  60 tablet    Refill:  5    Patient Instructions  Allergic rhinitis Continue allergen avoidance measures directed toward grass pollen, weed pollen, ragweed pollen, tree pollen, cat, dog, and mold as listed below Continue allergen immunotherapy per protocol and have access to an epinephrine autoinjector set Continue Xyzal 5 mg once a day as needed for runny nose or itch Continue Flonase 2 sprays in each nostril once a day as needed for a stuffy nose.  In the right nostril, point the applicator out toward the right ear. In the left nostril, point the applicator out toward the left ear Consider saline nasal rinses as needed for nasal symptoms. Use this before any medicated nasal sprays for best result  Allergic conjunctivitis Some over the counter eye drops include Pataday one drop in each eye once a day as needed for red, itchy eyes OR Zaditor one drop in each eye twice a day as needed for red itchy eyes.  Chest discomfort/ EOE/ Reflux Continue pantoprazole as previously prescribed Continue to follow-up with your gastrointestinal specialist If your symptoms re-occur, begin a journal of events that occurred for up to 6 hours before your symptoms began including foods and beverages consumed, soaps or perfumes you had contact with, and medications.   Call the clinic if this treatment plan is not working well for you.  Follow up in 1 year or sooner if needed.  Return in about 1 year (around 10/27/2022), or if symptoms worsen or fail to improve.    Thank you for the opportunity to care for this patient.  Please do not hesitate to contact me with  questions.  Gareth Morgan, FNP [facility]

## 2021-10-26 NOTE — Patient Instructions (Addendum)
Allergic rhinitis Continue allergen avoidance measures directed toward grass pollen, weed pollen, ragweed pollen, tree pollen, cat, dog, and mold as listed below Continue allergen immunotherapy per protocol and have access to an epinephrine autoinjector set Continue Xyzal 5 mg once a day as needed for runny nose or itch Continue Flonase 2 sprays in each nostril once a day as needed for a stuffy nose.  In the right nostril, point the applicator out toward the right ear. In the left nostril, point the applicator out toward the left ear Consider saline nasal rinses as needed for nasal symptoms. Use this before any medicated nasal sprays for best result  Allergic conjunctivitis Some over the counter eye drops include Pataday one drop in each eye once a day as needed for red, itchy eyes OR Zaditor one drop in each eye twice a day as needed for red itchy eyes.  Chest discomfort/ EOE/ Reflux Continue pantoprazole as previously prescribed Continue to follow-up with your gastrointestinal specialist If your symptoms re-occur, begin a journal of events that occurred for up to 6 hours before your symptoms began including foods and beverages consumed, soaps or perfumes you had contact with, and medications.   Call the clinic if this treatment plan is not working well for you.  Follow up in 1 year or sooner if needed.

## 2021-10-30 ENCOUNTER — Ambulatory Visit (INDEPENDENT_AMBULATORY_CARE_PROVIDER_SITE_OTHER): Payer: Medicare Other | Admitting: Rheumatology

## 2021-10-30 ENCOUNTER — Encounter: Payer: Self-pay | Admitting: Rheumatology

## 2021-10-30 ENCOUNTER — Telehealth: Payer: Self-pay | Admitting: Rheumatology

## 2021-10-30 VITALS — BP 113/72 | HR 89 | Ht 68.0 in | Wt 164.0 lb

## 2021-10-30 DIAGNOSIS — G8929 Other chronic pain: Secondary | ICD-10-CM

## 2021-10-30 DIAGNOSIS — Z8719 Personal history of other diseases of the digestive system: Secondary | ICD-10-CM | POA: Diagnosis not present

## 2021-10-30 DIAGNOSIS — F5101 Primary insomnia: Secondary | ICD-10-CM | POA: Diagnosis not present

## 2021-10-30 DIAGNOSIS — M25561 Pain in right knee: Secondary | ICD-10-CM | POA: Diagnosis not present

## 2021-10-30 DIAGNOSIS — M25461 Effusion, right knee: Secondary | ICD-10-CM | POA: Diagnosis not present

## 2021-10-30 DIAGNOSIS — M5030 Other cervical disc degeneration, unspecified cervical region: Secondary | ICD-10-CM | POA: Diagnosis not present

## 2021-10-30 DIAGNOSIS — M19072 Primary osteoarthritis, left ankle and foot: Secondary | ICD-10-CM

## 2021-10-30 DIAGNOSIS — Z8639 Personal history of other endocrine, nutritional and metabolic disease: Secondary | ICD-10-CM | POA: Diagnosis not present

## 2021-10-30 DIAGNOSIS — M19071 Primary osteoarthritis, right ankle and foot: Secondary | ICD-10-CM

## 2021-10-30 DIAGNOSIS — M1A09X1 Idiopathic chronic gout, multiple sites, with tophus (tophi): Secondary | ICD-10-CM | POA: Diagnosis not present

## 2021-10-30 DIAGNOSIS — Z8679 Personal history of other diseases of the circulatory system: Secondary | ICD-10-CM

## 2021-10-30 DIAGNOSIS — Z79899 Other long term (current) drug therapy: Secondary | ICD-10-CM

## 2021-10-30 LAB — SYNOVIAL FLUID ANALYSIS, COMPLETE
Basophils, %: 0 %
Eosinophils-Synovial: 0 % (ref 0–2)
Lymphocytes-Synovial Fld: 43 % (ref 0–74)
Monocyte/Macrophage: 46 % (ref 0–69)
Neutrophil, Synovial: 11 % (ref 0–24)
Synoviocytes, %: 0 % (ref 0–15)
WBC, Synovial: 231 cells/uL — ABNORMAL HIGH (ref <150)

## 2021-10-30 MED ORDER — TRIAMCINOLONE ACETONIDE 40 MG/ML IJ SUSP
60.0000 mg | INTRAMUSCULAR | Status: AC | PRN
  Administered 2021-10-30: 60 mg via INTRA_ARTICULAR

## 2021-10-30 MED ORDER — LIDOCAINE HCL 1 % IJ SOLN
4.0000 mL | INTRAMUSCULAR | Status: AC | PRN
  Administered 2021-10-30: 4 mL

## 2021-10-30 NOTE — Telephone Encounter (Signed)
Patient called the office stating he had fluid drained off his right knee a month ago by Dr. Estanislado Pandy. Patient states he would like that done again soon if possible. Patient would like to know when he can get that done again. Patient states he also cant get into see Dr. Wynelle Link until July and would like Dr. Estanislado Pandy to recommend somewhere he can be seen sooner for his knees.

## 2021-10-30 NOTE — Telephone Encounter (Signed)
Patient scheduled to be seen 10/30/2021 at 2:45 pm

## 2021-10-30 NOTE — Progress Notes (Addendum)
Office Visit Note  Patient: Dennis Miles             Date of Birth: [birth year]           MRN: [id number]             PCP: Celene Squibb, MD Referring: Celene Squibb, MD Visit Date: 10/30/2021 Occupation: '@GUAROCC'$ @  Subjective:  Right knee joint pain and swelling  History of Present Illness: [name] is a 71 y.o. male with history of gout and osteoarthritis.  He had right knee joint aspiration and injection in February.  He states his symptoms resolved for a while and then the pain and swelling gradually came back.  He has been having pain and swelling in his right knee joint.  He has an appointment coming up with Dr. Maureen Ralphs at the end of July.  None of the other joints are painful.  He has been taking allopurinol 200 mg a day and has not had a gout flare.  Activities of Daily Living:  Patient reports morning stiffness for 0 minutes.   Patient Denies nocturnal pain.  Difficulty dressing/grooming: Denies Difficulty climbing stairs: Reports Difficulty getting out of chair: Reports Difficulty using hands for taps, buttons, cutlery, and/or writing: Denies  Review of Systems  Constitutional:  Negative for fatigue.  HENT:  Positive for mouth dryness. Negative for mouth sores and nose dryness.   Eyes:  Positive for itching. Negative for pain and dryness.  Respiratory:  Negative for shortness of breath and difficulty breathing.   Cardiovascular:  Negative for chest pain and palpitations.  Gastrointestinal:  Negative for blood in stool, constipation and diarrhea.  Endocrine: Negative for increased urination.  Genitourinary:  Negative for difficulty urinating.  Musculoskeletal:  Positive for joint pain, joint pain, joint swelling, myalgias, muscle tenderness and myalgias. Negative for morning stiffness.  Skin:  Negative for color change, rash and redness.  Allergic/Immunologic: Negative for susceptible to infections.  Neurological:  Negative for dizziness, numbness, headaches,  memory loss and weakness.  Hematological:  Negative for bruising/bleeding tendency.  Psychiatric/Behavioral:  Negative for confusion.    PMFS History:  Patient Active Problem List   Diagnosis Date Noted   Seasonal and perennial allergic rhinitis 27/74/1287   Eosinophilic esophagitis 86/76/7209   Esophageal dysphagia 05/14/2021   Abdominal pain, epigastric 05/14/2021   IBS (irritable bowel syndrome) 11/13/2020   Primary insomnia 09/26/2016   DJD (degenerative joint disease), cervical 09/26/2016   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   Abdominal pain, left lower quadrant 10/19/2012   Gastroesophageal reflux disease 04/15/2012   Gout 04/01/2011   Hypertension 04/01/2011   Elevated cholesterol with high triglycerides 04/01/2011    Past Medical History:  Diagnosis Date   Anxiety    GERD (gastroesophageal reflux disease)    Gout    Headache(784.0)    Hiatal hernia    High cholesterol    Hypertension    Skin cancer     Family History  Problem Relation Age of Onset   Aneurysm Father    Asthma Child    Colon cancer Neg Hx    Past Surgical History:  Procedure Laterality Date   APPENDECTOMY     BIOPSY  05/30/2021   Procedure: BIOPSY;  Surgeon: Rogene Houston, MD;  [facility];  Service: Endoscopy;;   COLONOSCOPY N/A 10/23/2012   Procedure: COLONOSCOPY;  Surgeon: Rogene Houston, MD;  [facility];  Service: Endoscopy;  Laterality: N/A;  Nadine N/A 05/30/2021   Procedure: ESOPHAGEAL DILATION;  Surgeon: Rogene Houston, MD;  [facility];  Service: Endoscopy;  Laterality: N/A;   ESOPHAGOGASTRODUODENOSCOPY  04/17/2011   Procedure: ESOPHAGOGASTRODUODENOSCOPY (EGD);  Surgeon: Rogene Houston, MD;  [facility];  Service: Endoscopy;  Laterality: N/A;  8:30   ESOPHAGOGASTRODUODENOSCOPY (EGD) WITH PROPOFOL N/A 05/30/2021   Procedure: ESOPHAGOGASTRODUODENOSCOPY (EGD) WITH PROPOFOL;  Surgeon:  Rogene Houston, MD;  [facility];  Service: Endoscopy;  Laterality: N/A;  7:30   HAND SURGERY  05/28/2016   INGUINAL HERNIA REPAIR Left 02/17/2013   Procedure: HERNIA REPAIR INGUINAL ADULT;  Surgeon: Jamesetta So, MD;  Location: [facility];  Service: General;  Laterality: Left;   INSERTION OF MESH Left 02/17/2013   Procedure: INSERTION OF MESH;  Surgeon: Jamesetta So, MD;  Location: [facility];  Service: General;  Laterality: Left;   SINOSCOPY     sinus sugery     SKIN CANCER EXCISION     x4   SQUAMOUS CELL CARCINOMA EXCISION Left 2023   forearm   Social History   Social History Narrative   Lives at home with daughter.  Retired.  Education 12th grade.  Children 2.     Immunization History  Administered Date(s) Administered   PFIZER(Purple Top)SARS-COV-2 Vaccination 08/02/2019, 08/23/2019, 06/22/2020     Objective: Vital Signs: BP 113/72 (BP Location: Left Arm, Patient Position: Sitting, Cuff Size: Normal)   Pulse 89   Ht '5\' 8"'$  (1.727 m)   Wt 164 lb (74.4 kg)   BMI 24.94 kg/m    Physical Exam Vitals and nursing note reviewed.  Constitutional:      Appearance: He is well-developed.  HENT:     Head: Normocephalic and atraumatic.  Eyes:     Conjunctiva/sclera: Conjunctivae normal.     Pupils: Pupils are equal, round, and reactive to light.  Cardiovascular:     Rate and Rhythm: Normal rate and regular rhythm.     Heart sounds: Normal heart sounds.  Pulmonary:     Effort: Pulmonary effort is normal.     Breath sounds: Normal breath sounds.  Abdominal:     General: Bowel sounds are normal.     Palpations: Abdomen is soft.  Musculoskeletal:     Cervical back: Normal range of motion and neck supple.  Skin:    General: Skin is warm and dry.     Capillary Refill: Capillary refill takes less than 2 seconds.  Neurological:     Mental Status: He is alert and oriented to person, place, and time.  Psychiatric:        Behavior: Behavior normal.     Musculoskeletal  Exam: C-spine was in good range of motion with some discomfort on left lateral rotation.  Shoulder joints, elbow joints, wrist joints with good range of motion.  He had bilateral PIP and DIP thickening with no synovitis.  Hip joints with good range of motion.  He had warmth swelling and effusion in his right knee joint.  There was no tenderness over ankles or MTPs.  CDAI Exam: CDAI Score: -- Patient Global: --; Provider Global: -- Swollen: --; Tender: -- Joint Exam 10/30/2021   No joint exam has been documented for this visit   There is currently no information documented on the homunculus. Go to the Rheumatology activity and complete the homunculus joint exam.  Investigation: No additional findings.  Imaging: No results found.  Recent Labs: Lab Results  Component Value Date   WBC 6.1 01/09/2021   HGB 13.9 01/09/2021   PLT 192 01/09/2021   NA 136 06/20/2021   K 4.1 06/20/2021   CL 100 06/20/2021   CO2 26 06/20/2021   GLUCOSE 103 (H) 06/20/2021   BUN 10 06/20/2021   CREATININE 1.02 06/20/2021   BILITOT 0.4 01/09/2021   ALKPHOS 71 09/26/2016   AST 26 01/09/2021   ALT 22 01/09/2021   PROT 6.7 01/09/2021   ALBUMIN 4.2 09/26/2016   CALCIUM 8.8 (L) 06/20/2021   GFRAA 75 07/11/2020    July 12, 2021 synovial fluid WBC count 136, crystals negative September 26, 2016 ANA negative, RF negative, anti-CCP negative, uric acid 3.1,  sed rate 1  Speciality Comments: No specialty comments available.  Procedures:  Large Joint Inj: R knee on 10/30/2021 3:43 PM Indications: pain Details: 27 G 1.5 in needle, medial approach  Arthrogram: No  Medications: 4 mL lidocaine 1 %; 60 mg triamcinolone acetonide 40 MG/ML Aspirate: 18 mL clear; sent for lab analysis Outcome: tolerated well, no immediate complications Procedure, treatment alternatives, risks and benefits explained, specific risks discussed. Consent was given by the patient. Immediately prior to procedure a time out was called  to verify the correct patient, procedure, equipment, support staff and site/side marked as required. Patient was prepped and draped in the usual sterile fashion.    Allergies: Codeine, Crestor [rosuvastatin], Lipitor [atorvastatin], and Zetia [ezetimibe]   Assessment / Plan:     Visit Diagnoses: Idiopathic chronic gout of multiple sites with tophus - uric acid: 06/15/2021 6.8 done by Dr. Nevada Crane.  He has been taking allopurinol 200 mg p.o. daily without interruption.Patient denies having a gout flare in a long time.  Plan: Uric acid  Medication management - Allopurinol 200 mg by mouth daily.  - Plan: CBC with Differential/Platelet, COMPLETE METABOLIC PANEL WITH GFR  Effusion, right knee -right knee joint was aspirated and injected on July 12, 2021.  He had a good response to cortisone injection although with time the pain and swelling returned.  Synovial fluid analysis was not consistent with inflammatory arthritis.  Crystals were negative.Moderate effusion was noted on the exam today. As per patient's request , after informed consent was obtained the synovial fluid was aspirated as documented above and knee joint was injected with lidocaine and Kenalog. Patient tolerated the procedure well. Post-procedure instructions were given. The synovial fluid was sent for analysis today.  All autoimmune work-up has been negative in the past.  We will recheck labs today.  Plan: Rheumatoid factor, Cyclic citrul peptide antibody, IgG, Sedimentation rate, Synovial Fluid Analysis, Complete.  I will obtain MRI of the knee joint to look for internal derangement.  Patient has an appointment coming up with the orthopedic surgeon towards the end of July.  Chronic pain of right knee-x-rays obtained at the last visit showed mild osteoarthritis and mild chondromalacia patella.  Primary osteoarthritis of both feet-he has intermittent discomfort in his feet.  DDD (degenerative disc disease), cervical-he has ongoing discomfort  in his cervical spine.  Primary insomnia  History of hyperlipidemia  History of hypertension  History of gastroesophageal reflux (GERD)  Orders: Orders Placed This Encounter  Procedures   Large Joint Inj: R knee   MR KNEE RIGHT WO CONTRAST   CBC with Differential/Platelet   COMPLETE METABOLIC PANEL WITH GFR   Uric acid   Rheumatoid factor   Cyclic citrul peptide antibody, IgG   Sedimentation rate   Synovial Fluid Analysis, Complete   No  orders of the defined types were placed in this encounter.    Follow-Up Instructions: Return in about 3 months (around 01/30/2022) for Gout, Osteoarthritis.   Bo Merino, MD  Note - This record has been created using Editor, commissioning.  Chart creation errors have been sought, but may not always  have been located. Such creation errors do not reflect on  the standard of medical care.

## 2021-10-31 ENCOUNTER — Encounter: Payer: Self-pay | Admitting: Family Medicine

## 2021-10-31 NOTE — Progress Notes (Signed)
Synovial fluid shows mild inflammation.  Crystals were negative.  Uric acid is in desirable range.  CBC normal, CMP showed mildly elevated glucose, probably not a fasting sample.  Rheumatoid factor negative, sedimentation rate normal.

## 2021-11-01 HISTORY — PX: KNEE ARTHROSCOPY W/ MENISCAL REPAIR: SHX1877

## 2021-11-01 LAB — CBC WITH DIFFERENTIAL/PLATELET
Absolute Monocytes: 707 cells/uL (ref 200–950)
Basophils Absolute: 37 cells/uL (ref 0–200)
Basophils Relative: 0.6 %
Eosinophils Absolute: 329 cells/uL (ref 15–500)
Eosinophils Relative: 5.3 %
HCT: 40.3 % (ref 38.5–50.0)
Hemoglobin: 13.8 g/dL (ref 13.2–17.1)
Lymphs Abs: 1538 cells/uL (ref 850–3900)
MCH: 34.7 pg — ABNORMAL HIGH (ref 27.0–33.0)
MCHC: 34.2 g/dL (ref 32.0–36.0)
MCV: 101.3 fL — ABNORMAL HIGH (ref 80.0–100.0)
MPV: 10.9 fL (ref 7.5–12.5)
Monocytes Relative: 11.4 %
Neutro Abs: 3590 cells/uL (ref 1500–7800)
Neutrophils Relative %: 57.9 %
Platelets: 211 10*3/uL (ref 140–400)
RBC: 3.98 10*6/uL — ABNORMAL LOW (ref 4.20–5.80)
RDW: 12.3 % (ref 11.0–15.0)
Total Lymphocyte: 24.8 %
WBC: 6.2 10*3/uL (ref 3.8–10.8)

## 2021-11-01 LAB — COMPLETE METABOLIC PANEL WITH GFR
AG Ratio: 1.6 (calc) (ref 1.0–2.5)
ALT: 18 U/L (ref 9–46)
AST: 24 U/L (ref 10–35)
Albumin: 4.1 g/dL (ref 3.6–5.1)
Alkaline phosphatase (APISO): 65 U/L (ref 35–144)
BUN: 7 mg/dL (ref 7–25)
CO2: 28 mmol/L (ref 20–32)
Calcium: 9.1 mg/dL (ref 8.6–10.3)
Chloride: 100 mmol/L (ref 98–110)
Creat: 1 mg/dL (ref 0.70–1.35)
Globulin: 2.5 g/dL (calc) (ref 1.9–3.7)
Glucose, Bld: 121 mg/dL — ABNORMAL HIGH (ref 65–99)
Potassium: 4.4 mmol/L (ref 3.5–5.3)
Sodium: 139 mmol/L (ref 135–146)
Total Bilirubin: 0.5 mg/dL (ref 0.2–1.2)
Total Protein: 6.6 g/dL (ref 6.1–8.1)
eGFR: 82 mL/min/{1.73_m2} (ref >=60)

## 2021-11-01 LAB — SEDIMENTATION RATE: Sed Rate: 6 mm/h (ref 0–20)

## 2021-11-01 LAB — URIC ACID: Uric Acid, Serum: 5.8 mg/dL (ref 4.0–8.0)

## 2021-11-01 LAB — RHEUMATOID FACTOR: Rheumatoid fact SerPl-aCnc: <14 IU/mL (ref <14)

## 2021-11-01 LAB — CYCLIC CITRUL PEPTIDE ANTIBODY, IGG: Cyclic Citrullin Peptide Ab: <16 UNITS

## 2021-11-01 NOTE — Progress Notes (Signed)
CBC, CMP are normal.  Glucose is mildly elevated probably not a fasting sample.  Uric acid is in desirable range at 5.8.  Rheumatoid factor and anti-CCP are negative.  Sed rate is normal.

## 2021-11-02 ENCOUNTER — Ambulatory Visit (INDEPENDENT_AMBULATORY_CARE_PROVIDER_SITE_OTHER): Payer: Medicare Other

## 2021-11-02 DIAGNOSIS — J309 Allergic rhinitis, unspecified: Secondary | ICD-10-CM

## 2021-11-07 ENCOUNTER — Ambulatory Visit (INDEPENDENT_AMBULATORY_CARE_PROVIDER_SITE_OTHER): Payer: Medicare Other

## 2021-11-07 DIAGNOSIS — J309 Allergic rhinitis, unspecified: Secondary | ICD-10-CM

## 2021-11-11 ENCOUNTER — Ambulatory Visit (HOSPITAL_COMMUNITY)
Admission: RE | Admit: 2021-11-11 | Discharge: 2021-11-11 | Disposition: A | Discharge status: Home / Self Care | Payer: Medicare Other | Source: Ambulatory Visit | Attending: Rheumatology | Admitting: Rheumatology

## 2021-11-11 DIAGNOSIS — M25461 Effusion, right knee: Secondary | ICD-10-CM

## 2021-11-11 DIAGNOSIS — G8929 Other chronic pain: Secondary | ICD-10-CM | POA: Diagnosis not present

## 2021-11-11 DIAGNOSIS — M25561 Pain in right knee: Secondary | ICD-10-CM | POA: Diagnosis not present

## 2021-11-13 ENCOUNTER — Telehealth: Payer: Self-pay | Admitting: Rheumatology

## 2021-11-13 DIAGNOSIS — G8929 Other chronic pain: Secondary | ICD-10-CM

## 2021-11-13 NOTE — Progress Notes (Signed)
MRI of the right knee joint showed meniscal tear.  Patient will need evaluation by an orthopedic surgeon.  Please refer patient to anorthopedic surgeon of his choice.

## 2021-11-13 NOTE — Telephone Encounter (Signed)
Patient called the office stating he had missed a call from the office but is unsure from who or what it may be about. Patient requests a return call.

## 2021-11-13 NOTE — Telephone Encounter (Signed)
See imaging note for details. Placed referral to [facility].

## 2021-11-16 ENCOUNTER — Ambulatory Visit (INDEPENDENT_AMBULATORY_CARE_PROVIDER_SITE_OTHER): Payer: Medicare Other

## 2021-11-16 DIAGNOSIS — J309 Allergic rhinitis, unspecified: Secondary | ICD-10-CM

## 2021-11-21 ENCOUNTER — Ambulatory Visit (INDEPENDENT_AMBULATORY_CARE_PROVIDER_SITE_OTHER): Payer: Medicare Other | Admitting: Orthopaedic Surgery

## 2021-11-21 ENCOUNTER — Ambulatory Visit (INDEPENDENT_AMBULATORY_CARE_PROVIDER_SITE_OTHER): Payer: Medicare Other

## 2021-11-21 ENCOUNTER — Encounter: Payer: Self-pay | Admitting: Orthopaedic Surgery

## 2021-11-21 VITALS — Ht 68.5 in | Wt 164.0 lb

## 2021-11-21 DIAGNOSIS — S83281A Other tear of lateral meniscus, current injury, right knee, initial encounter: Secondary | ICD-10-CM

## 2021-11-21 DIAGNOSIS — J309 Allergic rhinitis, unspecified: Secondary | ICD-10-CM | POA: Diagnosis not present

## 2021-11-21 NOTE — Progress Notes (Signed)
Office Visit Note   Patient: Dennis Miles           Date of Birth: 04-20-1951           MRN: [id number] Visit Date: 11/21/2021              Requested by: Bo Merino, MD [address] [place],  [place] [address] PCP: Celene Squibb, MD   Assessment & Plan: Visit Diagnoses:  1. Acute lateral meniscus tear of right knee, initial encounter     Plan: Impression is right knee pain with acute lateral meniscus and mild to moderate lateral compartment DJD.  I believe the patient is primarily symptomatic from the lateral meniscus tear.  He has tried cortisone injections in the past with only temporary relief.  We have discussed surgical debridement with arthroscopy for which he is agreeable to.  Risk, benefits and prognosis reviewed.  Rehab and recovery time discussed.  All questions were answered.  Follow-Up Instructions: Return for post-op.   Orders:  No orders of the defined types were placed in this encounter.  No orders of the defined types were placed in this encounter.     Procedures: No procedures performed   Clinical Data: No additional findings.   Subjective: Chief Complaint  Patient presents with   Right Knee - Pain    HPI patient is a very pleasant 71 year old gentleman who comes in today with right knee pain.  This began several months ago likely after twisting his knee and feeling a pop.  Pain he has been having is to the entire knee but occasionally more to the posterior medial aspect.  He notes some discomfort at rest but the majority of his pain occurs when he is pivoting.  He denies any locking or catching but does note some popping.  He has been using an Ace bandage which does seem to help.  His GI doctor has advised him not to take NSAIDs but has taken Tylenol without relief.  He has been seen by Dr. Simona Huh wire where the right knee has been aspirated and injected with cortisone twice.  Joint fluid was negative for crystals or evidence of RA.   He currently sees her for a history of gout.  Of note, recent MRI of the right knee was ordered which showed moderate degenerative changes to the lateral compartment with lateral meniscus tear.  Review of Systems as detailed in HPI.  All others reviewed and are negative.   Objective: Vital Signs: Ht 5' 8.5" (1.74 m)   Wt 164 lb (74.4 kg)   BMI 24.57 kg/m   Physical Exam well-developed well-nourished gentleman in no acute distress.  Alert and oriented x3.  Ortho Exam right knee exam shows no effusion.  Range of motion 0 to 125 degrees.  No joint line tenderness.  Moderate patellofemoral crepitus.  He is neurovascular intact distally.  Specialty Comments:  No specialty comments available.  Imaging: No new imaging   PMFS History: Patient Active Problem List   Diagnosis Date Noted   Acute lateral meniscus tear of right knee 11/21/2021   Seasonal and perennial allergic rhinitis 81/15/7262   Eosinophilic esophagitis 03/55/9741   Esophageal dysphagia 05/14/2021   Abdominal pain, epigastric 05/14/2021   IBS (irritable bowel syndrome) 11/13/2020   Primary insomnia 09/26/2016   DJD (degenerative joint disease), cervical 09/26/2016   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   Abdominal pain, left lower quadrant 10/19/2012   Gastroesophageal reflux disease 04/15/2012  Gout 04/01/2011   Hypertension 04/01/2011   Elevated cholesterol with high triglycerides 04/01/2011   Past Medical History:  Diagnosis Date   Anxiety    GERD (gastroesophageal reflux disease)    Gout    Headache(784.0)    Hiatal hernia    High cholesterol    Hypertension    Skin cancer     Family History  Problem Relation Age of Onset   Aneurysm Father    Asthma Child    Colon cancer Neg Hx     Past Surgical History:  Procedure Laterality Date   APPENDECTOMY     BIOPSY  05/30/2021   Procedure: BIOPSY;  Surgeon: Rogene Houston, MD;  [facility];  Service: Endoscopy;;    COLONOSCOPY N/A 10/23/2012   Procedure: COLONOSCOPY;  Surgeon: Rogene Houston, MD;  [facility];  Service: Endoscopy;  Laterality: N/A;  235   ESOPHAGEAL DILATION N/A 05/30/2021   Procedure: ESOPHAGEAL DILATION;  Surgeon: Rogene Houston, MD;  [facility];  Service: Endoscopy;  Laterality: N/A;   ESOPHAGOGASTRODUODENOSCOPY  04/17/2011   Procedure: ESOPHAGOGASTRODUODENOSCOPY (EGD);  Surgeon: Rogene Houston, MD;  [facility];  Service: Endoscopy;  Laterality: N/A;  8:30   ESOPHAGOGASTRODUODENOSCOPY (EGD) WITH PROPOFOL N/A 05/30/2021   Procedure: ESOPHAGOGASTRODUODENOSCOPY (EGD) WITH PROPOFOL;  Surgeon: Rogene Houston, MD;  [facility];  Service: Endoscopy;  Laterality: N/A;  7:30   HAND SURGERY  05/28/2016   INGUINAL HERNIA REPAIR Left 02/17/2013   Procedure: HERNIA REPAIR INGUINAL ADULT;  Surgeon: Jamesetta So, MD;  Location: [facility];  Service: General;  Laterality: Left;   INSERTION OF MESH Left 02/17/2013   Procedure: INSERTION OF MESH;  Surgeon: Jamesetta So, MD;  [facility];  Service: General;  Laterality: Left;   SINOSCOPY     sinus sugery     SKIN CANCER EXCISION     x4   SQUAMOUS CELL CARCINOMA EXCISION Left 2023   forearm   Social History   Occupational History   Not on file  Tobacco Use   Smoking status: Never    Passive exposure: Never   Smokeless tobacco: Former    Types: Chew    Quit date: 06/03/2012  Vaping Use   Vaping Use: Never used  Substance and Sexual Activity   Alcohol use: Yes    Alcohol/week: 1.0 standard drink of alcohol    Types: 1 Cans of beer per week    Comment: Social   Drug use: Never   Sexual activity: Not on file

## 2021-11-23 ENCOUNTER — Other Ambulatory Visit: Payer: Self-pay | Admitting: Physician Assistant

## 2021-11-23 MED ORDER — HYDROCODONE-ACETAMINOPHEN 5-325 MG PO TABS: 1.0000 | ORAL_TABLET | Freq: Three times a day (TID) | ORAL | 0 refills | Status: DC | PRN

## 2021-11-23 MED ORDER — ONDANSETRON HCL 4 MG PO TABS: 4.0000 mg | ORAL_TABLET | Freq: Three times a day (TID) | ORAL | 0 refills | Status: DC | PRN

## 2021-11-26 DIAGNOSIS — H35372 Puckering of macula, left eye: Secondary | ICD-10-CM | POA: Diagnosis not present

## 2021-11-29 ENCOUNTER — Encounter: Payer: Self-pay | Admitting: Orthopaedic Surgery

## 2021-11-29 DIAGNOSIS — M23231 Derangement of other medial meniscus due to old tear or injury, right knee: Secondary | ICD-10-CM | POA: Diagnosis not present

## 2021-11-29 DIAGNOSIS — M94261 Chondromalacia, right knee: Secondary | ICD-10-CM | POA: Diagnosis not present

## 2021-11-29 DIAGNOSIS — S83271A Complex tear of lateral meniscus, current injury, right knee, initial encounter: Secondary | ICD-10-CM | POA: Diagnosis not present

## 2021-11-29 DIAGNOSIS — M948X6 Other specified disorders of cartilage, lower leg: Secondary | ICD-10-CM | POA: Diagnosis not present

## 2021-11-29 DIAGNOSIS — M659 Synovitis and tenosynovitis, unspecified: Secondary | ICD-10-CM | POA: Diagnosis not present

## 2021-11-29 DIAGNOSIS — Y999 Unspecified external cause status: Secondary | ICD-10-CM | POA: Diagnosis not present

## 2021-11-29 DIAGNOSIS — X58XXXA Exposure to other specified factors, initial encounter: Secondary | ICD-10-CM | POA: Diagnosis not present

## 2021-12-06 ENCOUNTER — Encounter: Payer: Self-pay | Admitting: Orthopaedic Surgery

## 2021-12-06 ENCOUNTER — Ambulatory Visit (INDEPENDENT_AMBULATORY_CARE_PROVIDER_SITE_OTHER): Payer: Medicare Other | Admitting: Orthopaedic Surgery

## 2021-12-06 DIAGNOSIS — Z9889 Other specified postprocedural states: Secondary | ICD-10-CM

## 2021-12-06 NOTE — Progress Notes (Signed)
Post-Op Visit Note   Patient: Dennis Miles           Date of Birth: Dec 19, 1950           MRN: [id number] Visit Date: 12/06/2021 PCP: Celene Squibb, MD   Assessment & Plan:  Chief Complaint:  Chief Complaint  Patient presents with   Right Knee - Follow-up    Right knee arthroscopy 11/29/2021   Visit Diagnoses:  1. S/P arthroscopy of right knee     Plan: Patient is a pleasant 71 year old gentleman who comes in today 1 week status post right knee arthroscopic debridement lateral meniscus and chondroplasty 11/29/2021.  It was noted during operative invention he had grade 3 changes to the lateral and patellofemoral compartment.  He has been doing great.  He initially had soreness to the right calf following surgery but notes that this has improved.  Examination of his right knee reveals fully healed surgical portals with nylon sutures in place.  No evidence of infection or cellulitis.  Calf is soft and nontender.  He is neurovascular intact distally.  Today, sutures were removed and Steri-Strips applied.  Home exercise handout provided.  Follow-up in 5 weeks time for recheck.  Call with concerns or questions.  Follow-Up Instructions: Return in about 5 weeks (around 01/10/2022).   Orders:  No orders of the defined types were placed in this encounter.  No orders of the defined types were placed in this encounter.   Imaging: No new imaging  PMFS History: Patient Active Problem List   Diagnosis Date Noted   Acute lateral meniscus tear of right knee 11/21/2021   Seasonal and perennial allergic rhinitis 46/27/0350   Eosinophilic esophagitis 09/38/1829   Esophageal dysphagia 05/14/2021   Abdominal pain, epigastric 05/14/2021   IBS (irritable bowel syndrome) 11/13/2020   Primary insomnia 09/26/2016   DJD (degenerative joint disease), cervical 09/26/2016   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   Abdominal pain, left lower quadrant 10/19/2012    Gastroesophageal reflux disease 04/15/2012   Gout 04/01/2011   Hypertension 04/01/2011   Elevated cholesterol with high triglycerides 04/01/2011   Past Medical History:  Diagnosis Date   Anxiety    GERD (gastroesophageal reflux disease)    Gout    Headache(784.0)    Hiatal hernia    High cholesterol    Hypertension    Skin cancer     Family History  Problem Relation Age of Onset   Aneurysm Father    Asthma Child    Colon cancer Neg Hx     Past Surgical History:  Procedure Laterality Date   APPENDECTOMY     BIOPSY  05/30/2021   Procedure: BIOPSY;  Surgeon: Rogene Houston, MD;  [facility];  Service: Endoscopy;;   COLONOSCOPY N/A 10/23/2012   Procedure: COLONOSCOPY;  Surgeon: Rogene Houston, MD;  [facility];  Service: Endoscopy;  Laterality: N/A;  235   ESOPHAGEAL DILATION N/A 05/30/2021   Procedure: ESOPHAGEAL DILATION;  Surgeon: Rogene Houston, MD;  [facility];  Service: Endoscopy;  Laterality: N/A;   ESOPHAGOGASTRODUODENOSCOPY  04/17/2011   Procedure: ESOPHAGOGASTRODUODENOSCOPY (EGD);  Surgeon: Rogene Houston, MD;  [facility];  Service: Endoscopy;  Laterality: N/A;  8:30   ESOPHAGOGASTRODUODENOSCOPY (EGD) WITH PROPOFOL N/A 05/30/2021   Procedure: ESOPHAGOGASTRODUODENOSCOPY (EGD) WITH PROPOFOL;  Surgeon: Rogene Houston, MD;  [facility];  Service: Endoscopy;  Laterality: N/A;  7:30   HAND SURGERY  05/28/2016   INGUINAL HERNIA REPAIR Left 02/17/2013   Procedure: HERNIA REPAIR INGUINAL ADULT;  Surgeon: Jamesetta So, MD;  [facility]: [facility];  Service: General;  Laterality: Left;   INSERTION OF MESH Left 02/17/2013   Procedure: INSERTION OF MESH;  Surgeon: Jamesetta So, MD;  Location[facility];  Service: General;  Laterality: Left;   SINOSCOPY     sinus sugery     SKIN CANCER EXCISION     x4   SQUAMOUS CELL CARCINOMA EXCISION Left 2023   forearm   Social History   Occupational History   Not on file   Tobacco Use   Smoking status: Never    Passive exposure: Never   Smokeless tobacco: Former    Types: Chew    Quit date: 06/03/2012  Vaping Use   Vaping Use: Never used  Substance and Sexual Activity   Alcohol use: Yes    Alcohol/week: 1.0 standard drink of alcohol    Types: 1 Cans of beer per week    Comment: Social   Drug use: Never   Sexual activity: Not on file

## 2021-12-07 ENCOUNTER — Ambulatory Visit (INDEPENDENT_AMBULATORY_CARE_PROVIDER_SITE_OTHER): Payer: Medicare Other

## 2021-12-07 DIAGNOSIS — J309 Allergic rhinitis, unspecified: Secondary | ICD-10-CM | POA: Diagnosis not present

## 2021-12-12 ENCOUNTER — Other Ambulatory Visit: Payer: Self-pay

## 2021-12-12 ENCOUNTER — Telehealth: Payer: Self-pay | Admitting: Orthopaedic Surgery

## 2021-12-12 DIAGNOSIS — M79661 Pain in right lower leg: Secondary | ICD-10-CM

## 2021-12-12 DIAGNOSIS — E782 Mixed hyperlipidemia: Secondary | ICD-10-CM | POA: Diagnosis not present

## 2021-12-12 DIAGNOSIS — R7301 Impaired fasting glucose: Secondary | ICD-10-CM | POA: Diagnosis not present

## 2021-12-12 DIAGNOSIS — Z125 Encounter for screening for malignant neoplasm of prostate: Secondary | ICD-10-CM | POA: Diagnosis not present

## 2021-12-12 DIAGNOSIS — E790 Hyperuricemia without signs of inflammatory arthritis and tophaceous disease: Secondary | ICD-10-CM | POA: Diagnosis not present

## 2021-12-12 NOTE — Telephone Encounter (Signed)
Patient called in stating he would like to know what he should do about the swelling and tenderness of his leg he has been icing it and keeping it elevated he mentioned Dennis Miles said something about him getting an ultrasound if it was still swelling he would just like to know is that what he needs to do first or should he just come in for an appt? Please advise

## 2021-12-12 NOTE — Telephone Encounter (Signed)
Yes, lets please get u/s asap to r/o dvt

## 2021-12-13 ENCOUNTER — Ambulatory Visit (HOSPITAL_COMMUNITY)
Admission: RE | Admit: 2021-12-13 | Discharge: 2021-12-13 | Disposition: A | Discharge status: Home / Self Care | Payer: Medicare Other | Source: Ambulatory Visit | Attending: Orthopaedic Surgery | Admitting: Orthopaedic Surgery

## 2021-12-13 DIAGNOSIS — M79661 Pain in right lower leg: Secondary | ICD-10-CM | POA: Diagnosis not present

## 2021-12-13 DIAGNOSIS — M7989 Other specified soft tissue disorders: Secondary | ICD-10-CM

## 2021-12-13 NOTE — Telephone Encounter (Signed)
Contacted donna via teams informing her of the order in workq

## 2021-12-14 ENCOUNTER — Ambulatory Visit (HOSPITAL_COMMUNITY): Admission: RE | Admit: 2021-12-14 | Payer: Medicare Other | Source: Ambulatory Visit

## 2021-12-18 DIAGNOSIS — G5600 Carpal tunnel syndrome, unspecified upper limb: Secondary | ICD-10-CM | POA: Diagnosis not present

## 2021-12-18 DIAGNOSIS — E875 Hyperkalemia: Secondary | ICD-10-CM | POA: Diagnosis not present

## 2021-12-18 DIAGNOSIS — I10 Essential (primary) hypertension: Secondary | ICD-10-CM | POA: Diagnosis not present

## 2021-12-18 DIAGNOSIS — C4492 Squamous cell carcinoma of skin, unspecified: Secondary | ICD-10-CM | POA: Diagnosis not present

## 2021-12-18 DIAGNOSIS — K200 Eosinophilic esophagitis: Secondary | ICD-10-CM | POA: Diagnosis not present

## 2021-12-18 DIAGNOSIS — R7301 Impaired fasting glucose: Secondary | ICD-10-CM | POA: Diagnosis not present

## 2021-12-18 DIAGNOSIS — G4700 Insomnia, unspecified: Secondary | ICD-10-CM | POA: Diagnosis not present

## 2021-12-18 DIAGNOSIS — E790 Hyperuricemia without signs of inflammatory arthritis and tophaceous disease: Secondary | ICD-10-CM | POA: Diagnosis not present

## 2021-12-18 DIAGNOSIS — K219 Gastro-esophageal reflux disease without esophagitis: Secondary | ICD-10-CM | POA: Diagnosis not present

## 2021-12-18 DIAGNOSIS — J309 Allergic rhinitis, unspecified: Secondary | ICD-10-CM | POA: Diagnosis not present

## 2021-12-18 DIAGNOSIS — G248 Other dystonia: Secondary | ICD-10-CM | POA: Diagnosis not present

## 2021-12-18 DIAGNOSIS — E782 Mixed hyperlipidemia: Secondary | ICD-10-CM | POA: Diagnosis not present

## 2021-12-19 ENCOUNTER — Ambulatory Visit (INDEPENDENT_AMBULATORY_CARE_PROVIDER_SITE_OTHER): Payer: Medicare Other

## 2021-12-19 DIAGNOSIS — J309 Allergic rhinitis, unspecified: Secondary | ICD-10-CM

## 2021-12-21 ENCOUNTER — Telehealth: Payer: Self-pay

## 2021-12-21 ENCOUNTER — Telehealth: Payer: Self-pay | Admitting: *Deleted

## 2021-12-21 NOTE — Telephone Encounter (Signed)
Patient scheduled an appointment for right knee

## 2021-12-21 NOTE — Telephone Encounter (Signed)
Labs received from:Dr. Allyn Kenner  Drawn on:12/12/2021  Reviewed by:Hazel Sams, PA-C  Labs drawn:Uric Acid, PSA, Hgb A1C, Albumin/Creatinine, Lipid Panel, CMP, CBC  Results: BUN 6   Bun/Creat. Ratio 7   RBC 3.87   MCV 101   MCH 33.6  Total Cholesterol 222  Triglycerides 447  VLDL Cholesterol Cal 75  LDL Chol. Calc 100  Patient taking Allopurinol 200 mg po daily.

## 2021-12-22 NOTE — Progress Notes (Unsigned)
Cardiology Office Note:    Date:  12/27/2021   ID:  Dennis Miles, DOB Aug 22, 1950, MRN [id number]  PCP:  Celene Squibb, MD  Cardiologist:  None  Electrophysiologist:  None   Referring MD: Celene Squibb, MD   Chief Complaint  Patient presents with   Coronary Artery Disease    History of Present Illness:    Dennis Miles is a 71 y.o. male with a hx of hypertension, hypercholesterol who presents for follow-up.  He was referred by Dr. Nevada Miles for evaluation of chest pain and dyspnea, initially seen on 06/20/2021.  He reports he has been having chest pain x6 months.  Describes pressure in center chest.  Underwent endoscopy and found to have possible eosinophilic esophagitis.  He was started on steroid inhaler and reports significant improvement.  Does report though that he has been having dyspnea with exertion.  States that he notices it when he goes for long walks.  He tries to walk each day for 15 minutes.  He denies any lightheadedness, syncope, lower extremity edema, or palpitations.  No smoking history.  Family history includes mother had CABG in 83s.  Brother had MI in 37s and died of brain aneurysm in 27s.  Father also died of brain aneurysm in 77s.  Coronary CTA on 06/26/2021 showed mild CAD with calcified plaque in the proximal LAD causing 25 to 49% stenosis, calcium score 100 (48th percentile).  Since last clinic visit, he reports that he has been doing okay.  States that his chest pain resolved with treatment of his esophagitis.  Denies any dyspnea or palpitations.  Reports rare lightheadedness, denies any syncope.  He had knee surgery about 1 month ago.  Reports some swelling of his leg since his surgery.    Past Medical History:  Diagnosis Date   Anxiety    GERD (gastroesophageal reflux disease)    Gout    Headache(784.0)    Hiatal hernia    High cholesterol    Hypertension    Skin cancer     Past Surgical History:  Procedure Laterality Date   APPENDECTOMY     BIOPSY   05/30/2021   Procedure: BIOPSY;  Surgeon: Rogene Houston, MD;  [facility];  Service: Endoscopy;;   COLONOSCOPY N/A 10/23/2012   Procedure: COLONOSCOPY;  Surgeon: Rogene Houston, MD;  [facility];  Service: Endoscopy;  Laterality: N/A;  235   ESOPHAGEAL DILATION N/A 05/30/2021   Procedure: ESOPHAGEAL DILATION;  Surgeon: Rogene Houston, MD;  [facility];  Service: Endoscopy;  Laterality: N/A;   ESOPHAGOGASTRODUODENOSCOPY  04/17/2011   Procedure: ESOPHAGOGASTRODUODENOSCOPY (EGD);  Surgeon: Rogene Houston, MD;  [facility];  Service: Endoscopy;  Laterality: N/A;  8:30   ESOPHAGOGASTRODUODENOSCOPY (EGD) WITH PROPOFOL N/A 05/30/2021   Procedure: ESOPHAGOGASTRODUODENOSCOPY (EGD) WITH PROPOFOL;  Surgeon: Rogene Houston, MD;  [facility];  Service: Endoscopy;  Laterality: N/A;  7:30   HAND SURGERY  05/28/2016   INGUINAL HERNIA REPAIR Left 02/17/2013   Procedure: HERNIA REPAIR INGUINAL ADULT;  Surgeon: Jamesetta So, MD;  Location: [facility];  Service: General;  Laterality: Left;   INSERTION OF MESH Left 02/17/2013   Procedure: INSERTION OF MESH;  Surgeon: Jamesetta So, MD;  Location: [facility];  Service: General;  Laterality: Left;   SINOSCOPY     sinus sugery     SKIN CANCER EXCISION     x4   SQUAMOUS CELL CARCINOMA EXCISION Left  2023   forearm    Current Medications: Current Meds  Medication Sig   allopurinol (ZYLOPRIM) 100 MG tablet Take 2 tablets (200 mg total) by mouth daily.   cyclobenzaprine (FLEXERIL) 5 MG tablet Take 5 mg by mouth daily as needed for muscle spasms (pinched nerve).   EPINEPHRINE 0.3 mg/0.3 mL IJ SOAJ injection Inject 0.3 mg into the muscle as needed for anaphylaxis.   ezetimibe (ZETIA) 10 MG tablet Take 10 mg by mouth daily.   fluticasone (FLONASE) 50 MCG/ACT nasal spray Place 2 sprays into both nostrils daily as needed for allergies.   HYDROcodone-acetaminophen (NORCO) 5-325 MG tablet Take 1 tablet by  mouth 3 (three) times daily as needed. To be taken after surgery   levocetirizine (XYZAL) 5 MG tablet Take 1 tablet (5 mg total) by mouth daily as needed for allergies.   lisinopril (PRINIVIL,ZESTRIL) 10 MG tablet Take 10 mg by mouth daily.   ondansetron (ZOFRAN) 4 MG tablet Take 1 tablet (4 mg total) by mouth every 8 (eight) hours as needed for nausea or vomiting.   pantoprazole (PROTONIX) 40 MG tablet Take 1 tablet (40 mg total) by mouth daily before breakfast.   PRESCRIPTION MEDICATION Allergy injections at the allergist office   zolpidem (AMBIEN) 10 MG tablet Take 10 mg by mouth at bedtime.     Allergies:   Codeine, Crestor [rosuvastatin], Lipitor [atorvastatin], and Zetia [ezetimibe]   Social History   Socioeconomic History   Marital status: Divorced    Spouse name: Not on file   Number of children: Not on file   Years of education: Not on file   Highest education level: Not on file  Occupational History   Not on file  Tobacco Use   Smoking status: Never    Passive exposure: Never   Smokeless tobacco: Former    Types: Chew    Quit date: 06/03/2012  Vaping Use   Vaping Use: Never used  Substance and Sexual Activity   Alcohol use: Yes    Alcohol/week: 1.0 standard drink of alcohol    Types: 1 Cans of beer per week    Comment: Social   Drug use: Never   Sexual activity: Not on file  Other Topics Concern   Not on file  Social History Narrative   Lives at home with daughter.  Retired.  Education 12th grade.  Children 2.     Social Determinants of Health   Financial Resource Strain: Not on file  Food Insecurity: Not on file  Transportation Needs: Not on file  Physical Activity: Not on file  Stress: Not on file  Social Connections: Not on file     Family History: The patient's family history includes Aneurysm in his father; Asthma in his child. There is no history of Colon cancer.  ROS:   Please see the history of present illness.     All other systems reviewed and  are negative.  EKGs/Labs/Other Studies Reviewed:    The following studies were reviewed today:   EKG:   05/29/21: Normal sinus rhythm, rate 79, Q-wave in III, no ST abnormalities  Recent Labs: 10/30/2021: ALT 18; BUN 7; Creat 1.00; Hemoglobin 13.8; Platelets 211; Potassium 4.4; Sodium 139  Recent Lipid Panel No results found for: "CHOL", "TRIG", "HDL", "CHOLHDL", "VLDL", "LDLCALC", "LDLDIRECT"  Physical Exam:    VS:  BP 118/66   Pulse 74   Ht '5\' 9"'$  (1.753 m)   Wt 165 lb (74.8 kg)   BMI 24.37 kg/m  Wt Readings from Last 3 Encounters:  12/27/21 165 lb (74.8 kg)  11/21/21 164 lb (74.4 kg)  10/30/21 164 lb (74.4 kg)     GEN:  Well nourished, well developed in no acute distress HEENT: Normal NECK: No JVD; No carotid bruits LYMPHATICS: No lymphadenopathy CARDIAC: RRR, no murmurs, rubs, gallops RESPIRATORY:  Clear to auscultation without rales, wheezing or rhonchi  ABDOMEN: Soft, non-tender, non-distended MUSCULOSKELETAL:  No edema; No deformity  SKIN: Warm and dry NEUROLOGIC:  Alert and oriented x 3 PSYCHIATRIC:  Normal affect   ASSESSMENT:    1. Elevated cholesterol with high triglycerides   2. Coronary artery disease involving native coronary artery of native heart without angina pectoris   3. Chest pain of uncertain etiology   4. Essential hypertension   5. Hyperlipidemia, unspecified hyperlipidemia type     PLAN:    Chest pain/DOE: Having atypical chest pain but does have significant CAD risk factors (hypertension, hyperlipidemia, family history).  Coronary CTA on 06/26/2021 showed mild CAD with calcified plaque in the proximal LAD causing 25 to 49% stenosis, calcium score 100 (48th percentile).  Suspect chest pain due to eosinophilic esophagitis, resolved with treating his esophagitis.  Hypertension: On lisinopril 10 mg daily. Appears controlled  Hyperlipidemia: On Zetia 10 mg daily.  Unable to tolerate statins due to myalgias.  LDL 139 on 02/08/21.  Coronary  CTA results as above.  Referred to pharmacy lipid clinic.  Started rosuvastatin 10 mg 3 times weekly 07/2021.  He reports was unable to tolerate due to myalgias.  Lipid panel 12/12/2021 showed LDL 100, triglycerides 447 -Recommend follow-up in lipid clinic to consider alternatives given inability to tolerate statins  Family history of intracranial aneurysms: Reports both his brother and father died of intracranial aneurysm rupture.  Underwent CTA head to evaluate for aneurysm on 06/26/2021, showed no aneurysm.   RTC in 1 year   Medication Adjustments/Labs and Tests Ordered: Current medicines are reviewed at length with the patient today.  Concerns regarding medicines are outlined above.  Orders Placed This Encounter  Procedures   AMB Referral to [facility]   No orders of the defined types were placed in this encounter.   Patient Instructions  Medication Instructions:  Your physician recommends that you continue on your current medications as directed. Please refer to the Current Medication list given to you today.   Labwork: None today  Testing/Procedures: None today  Follow-Up: 1 year  Any Other Special Instructions Will Be Listed Below (If Applicable).   You have been referred to [facility].   If you need a refill on your cardiac medications before your next appointment, please call your pharmacy.    Signed, Donato Heinz, MD  12/27/2021 1:51 PM    [facility]

## 2021-12-25 ENCOUNTER — Ambulatory Visit (INDEPENDENT_AMBULATORY_CARE_PROVIDER_SITE_OTHER): Payer: Medicare Other | Admitting: Orthopaedic Surgery

## 2021-12-25 DIAGNOSIS — Z9889 Other specified postprocedural states: Secondary | ICD-10-CM | POA: Insufficient documentation

## 2021-12-25 DIAGNOSIS — S83281A Other tear of lateral meniscus, current injury, right knee, initial encounter: Secondary | ICD-10-CM

## 2021-12-25 NOTE — Progress Notes (Signed)
Post-Op Visit Note   Patient: Dennis Miles           Date of Birth: 1951-04-30           MRN: [id number] Visit Date: 12/25/2021 PCP: Celene Squibb, MD   Assessment & Plan:  Chief Complaint:  Chief Complaint  Patient presents with   Right Knee - Miles   Visit Diagnoses:  1. S/P arthroscopy of right knee   2. Acute lateral meniscus tear of right knee, initial encounter     Plan: Dennis Miles is 4 weeks status post right knee scope lateral meniscal debridement.  He comes in with concerns of continued swelling and Baker's cyst.  Denies any signs or symptoms of infection.  Examination of the right knee shows fully healed surgical scars.  He has a moderate effusion and tenderness to palpation of the Baker's cyst.  Range of motion is about 5 to 110 degrees without significant discomfort.  I went over the MRI with Dennis Miles which confirms that he did have a Baker's cyst even preoperatively.  Reassurance was provided that this is a normal finding and as a result of the pathology inside his knee.  Functionally the knee is doing well I recommended that he follow-up in 4 weeks and if he continues to have the effusion we could aspirate it and put some cortisone in it.  For now he is going to continue with RICE.  Follow-Up Instructions: Return in about 4 weeks (around 01/22/2022).   Orders:  No orders of the defined types were placed in this encounter.  No orders of the defined types were placed in this encounter.   Imaging: No results found.  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   Abdominal Miles, left lower quadrant 10/19/2012   Gastroesophageal reflux disease 04/15/2012   Gout 04/01/2011   Hypertension 04/01/2011   Elevated cholesterol with high triglycerides 04/01/2011   Past Medical History:  Diagnosis Date   Anxiety    GERD (gastroesophageal reflux disease)    Gout    Headache(784.0)    Hiatal hernia    High cholesterol    Hypertension    Skin cancer     Family History  Problem Relation Age of Onset   Aneurysm Father    Asthma Child    Colon cancer Neg Hx     Past Surgical History:  Procedure Laterality Date   APPENDECTOMY     BIOPSY  05/30/2021   Procedure: BIOPSY;  Surgeon: Rogene Houston, MD;  [facility];  Service: Endoscopy;;   COLONOSCOPY N/A 10/23/2012   Procedure: COLONOSCOPY;  Surgeon: Rogene Houston, MD;  [facility];  Service: Endoscopy;  Laterality: N/A;  235   ESOPHAGEAL DILATION N/A 05/30/2021   Procedure: ESOPHAGEAL DILATION;  Surgeon: Rogene Houston, MD;  [facility];  Service: Endoscopy;  Laterality: N/A;   ESOPHAGOGASTRODUODENOSCOPY  04/17/2011   Procedure: ESOPHAGOGASTRODUODENOSCOPY (EGD);  Surgeon: Rogene Houston, MD;  [facility];  Service: Endoscopy;  Laterality:  N/A;  8:30   ESOPHAGOGASTRODUODENOSCOPY (EGD) WITH PROPOFOL N/A 05/30/2021   Procedure: ESOPHAGOGASTRODUODENOSCOPY (EGD) WITH PROPOFOL;  Surgeon: Rogene Houston, MD;  [facility];  Service: Endoscopy;  Laterality: N/A;  7:30   HAND SURGERY  05/28/2016   INGUINAL HERNIA REPAIR Left 02/17/2013   Procedure: HERNIA REPAIR INGUINAL ADULT;  Surgeon: Jamesetta So, MD;  Location: [facility];  Service: General;  Laterality: Left;   INSERTION OF MESH Left 02/17/2013   Procedure: INSERTION OF MESH;  Surgeon: Jamesetta So, MD;  Location: [facility];  Service: General;  Laterality: Left;   SINOSCOPY     sinus sugery     SKIN  CANCER EXCISION     x4   SQUAMOUS CELL CARCINOMA EXCISION Left 2023   forearm   Social History   Occupational History   Not on file  Tobacco Use   Smoking status: Never    Passive exposure: Never   Smokeless tobacco: Former    Types: Chew    Quit date: 06/03/2012  Vaping Use   Vaping Use: Never used  Substance and Sexual Activity   Alcohol use: Yes    Alcohol/week: 1.0 standard drink of alcohol    Types: 1 Cans of beer per week    Comment: Social   Drug use: Never   Sexual activity: Not on file

## 2021-12-26 ENCOUNTER — Ambulatory Visit (INDEPENDENT_AMBULATORY_CARE_PROVIDER_SITE_OTHER): Payer: Medicare Other

## 2021-12-26 DIAGNOSIS — J309 Allergic rhinitis, unspecified: Secondary | ICD-10-CM | POA: Diagnosis not present

## 2021-12-26 NOTE — Progress Notes (Signed)
Office Visit Note  Patient: Dennis Miles             Date of Birth: Feb 06, 1951           MRN: [id number]             PCP: Celene Squibb, MD Referring: Celene Squibb, MD Visit Date: 01/09/2022 Occupation: '@GUAROCC'$ @  Subjective:  Right knee joint pain and swelling  History of Present Illness: [name] is a 71 y.o. male with history of osteoarthritis and gouty arthropathy.  He underwent right knee joint arthroscopic surgery for acute lateral meniscal tear by Dr. Erlinda Hong about 6 weeks ago.  He states he had good response initially for couple of weeks and then the pain and swelling came back.  He states has been having a lot of pain and discomfort from Baker's cyst and he is having difficulty bending his knee.  He could not go for physical therapy.  He was evaluated by Dr. Erlinda Hong on July 25 who recommended to hold off on cortisone injection due to recent surgery.  Patient has been experiencing increased swelling in his right ankle joint which is not painful.  He is concerned that the Baker's cyst might have leaked into his right ankle.  None of the other joints are painful.  He has not had any gout flare.  Activities of Daily Living:  Patient reports morning stiffness for a few minutes.   Patient Denies nocturnal pain.  Difficulty dressing/grooming: Denies Difficulty climbing stairs: Denies Difficulty getting out of chair: Reports Difficulty using hands for taps, buttons, cutlery, and/or writing: Denies  Review of Systems  Constitutional:  Positive for fatigue.  HENT:  Positive for mouth dryness. Negative for mouth sores.   Eyes:  Positive for dryness.  Respiratory:  Negative for shortness of breath.   Cardiovascular:  Negative for chest pain and palpitations.  Gastrointestinal:  Negative for blood in stool, constipation and diarrhea.  Endocrine: Negative for increased urination.  Genitourinary:  Negative for involuntary urination.  Musculoskeletal:  Positive for joint pain, joint pain,  joint swelling and morning stiffness. Negative for gait problem, myalgias, muscle weakness, muscle tenderness and myalgias.  Skin:  Negative for color change, rash, hair loss and sensitivity to sunlight.  Allergic/Immunologic: Negative for susceptible to infections.  Neurological:  Negative for dizziness and headaches.  Hematological:  Negative for swollen glands.  Psychiatric/Behavioral:  Negative for depressed mood and sleep disturbance. The patient is not nervous/anxious.     PMFS History:  Patient Active Problem List   Diagnosis Date Noted   S/P arthroscopy of right knee 12/25/2021   Acute lateral meniscus tear of right knee 11/21/2021   Seasonal and perennial allergic rhinitis 25/10/3974   Eosinophilic esophagitis 73/41/9379   Esophageal dysphagia 05/14/2021   Abdominal pain, epigastric 05/14/2021   IBS (irritable bowel syndrome) 11/13/2020   Primary insomnia 09/26/2016   DJD (degenerative joint disease), cervical 09/26/2016   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   Abdominal pain, left lower quadrant 10/19/2012   Gastroesophageal reflux disease 04/15/2012   Gout 04/01/2011   Hypertension 04/01/2011   Elevated cholesterol with high triglycerides 04/01/2011    Past Medical History:  Diagnosis Date   Anxiety    GERD (gastroesophageal reflux disease)    Gout    Headache(784.0)    Hiatal hernia    High cholesterol    Hypertension    Skin cancer     Family History  Problem Relation Age of  Onset   Aneurysm Father    Asthma Child    Colon cancer Neg Hx    Past Surgical History:  Procedure Laterality Date   APPENDECTOMY     BIOPSY  05/30/2021   Procedure: BIOPSY;  Surgeon: Rogene Houston, MD;  [facility];  Service: Endoscopy;;   COLONOSCOPY N/A 10/23/2012   Procedure: COLONOSCOPY;  Surgeon: Rogene Houston, MD;  [facility];  Service: Endoscopy;  Laterality: N/A;  235   ESOPHAGEAL DILATION N/A 05/30/2021    Procedure: ESOPHAGEAL DILATION;  Surgeon: Rogene Houston, MD;  [facility];  Service: Endoscopy;  Laterality: N/A;   ESOPHAGOGASTRODUODENOSCOPY  04/17/2011   Procedure: ESOPHAGOGASTRODUODENOSCOPY (EGD);  Surgeon: Rogene Houston, MD;  [facility];  Service: Endoscopy;  Laterality: N/A;  8:30   ESOPHAGOGASTRODUODENOSCOPY (EGD) WITH PROPOFOL N/A 05/30/2021   Procedure: ESOPHAGOGASTRODUODENOSCOPY (EGD) WITH PROPOFOL;  Surgeon: Rogene Houston, MD;  [facility];  Service: Endoscopy;  Laterality: N/A;  7:30   HAND SURGERY  05/28/2016   INGUINAL HERNIA REPAIR Left 02/17/2013   Procedure: HERNIA REPAIR INGUINAL ADULT;  Surgeon: Jamesetta So, MD;  Location: [facility];  Service: General;  Laterality: Left;   INSERTION OF MESH Left 02/17/2013   Procedure: INSERTION OF MESH;  Surgeon: Jamesetta So, MD;  Location: [facility];  Service: General;  Laterality: Left;   KNEE ARTHROSCOPY W/ MENISCAL REPAIR Right 11/2021   SINOSCOPY     sinus sugery     SKIN CANCER EXCISION     x4   SQUAMOUS CELL CARCINOMA EXCISION Left 2023   forearm   Social History   Social History Narrative   Lives at home with daughter.  Retired.  Education 12th grade.  Children 2.     Immunization History  Administered Date(s) Administered   PFIZER(Purple Top)SARS-COV-2 Vaccination 08/02/2019, 08/23/2019, 06/22/2020     Objective: Vital Signs: BP 121/82 (BP Location: Right Arm, Patient Position: Sitting, Cuff Size: Normal)   Pulse 76   Resp 14   Ht '5\' 8"'$  (1.727 m)   Wt 165 lb (74.8 kg)   BMI 25.09 kg/m    Physical Exam Vitals and nursing note reviewed.  Constitutional:      Appearance: He is well-developed.  HENT:     Head: Normocephalic and atraumatic.  Eyes:     Conjunctiva/sclera: Conjunctivae normal.     Pupils: Pupils are equal, round, and reactive to light.  Cardiovascular:     Rate and Rhythm: Normal rate and regular rhythm.     Heart sounds: Normal heart sounds.   Pulmonary:     Effort: Pulmonary effort is normal.     Breath sounds: Normal breath sounds.  Abdominal:     General: Bowel sounds are normal.     Palpations: Abdomen is soft.  Musculoskeletal:     Cervical back: Normal range of motion and neck supple.  Skin:    General: Skin is warm and dry.     Capillary Refill: Capillary refill takes less than 2 seconds.  Neurological:     Mental Status: He is alert and oriented to person, place, and time.  Psychiatric:        Behavior: Behavior normal.      Musculoskeletal Exam: Cervical spine was in good range of motion.  He has some discomfort with lateral rotation of the cervical spine.  Shoulder joints, elbow joints, wrist joints, MCPs PIPs and DIPs with good range of motion.  PIP  and DIP thickening due to osteoarthritis was noted.  Hip joints in good range of motion.  He had warmth swelling and a large effusion in his right knee joint with Baker's cyst.  He also had pitting edema over his right ankle.  Left knee joint was in good range of motion.  CDAI Exam: CDAI Score: -- Patient Global: --; Provider Global: -- Swollen: --; Tender: -- Joint Exam 01/09/2022   No joint exam has been documented for this visit   There is currently no information documented on the homunculus. Go to the Rheumatology activity and complete the homunculus joint exam.  Investigation: No additional findings.  Imaging: VAS [place] LOWER EXTREMITY VENOUS (DVT)  Result Date: 12/13/2021  Lower Venous DVT Study Patient Name:  BRIER REID  Date of Exam:   12/13/2021 Medical Rec #: [id number]        Accession #:    [id number] Date of Birth: 1950/07/26        Patient Gender: M Patient Age:   24 years Exam Location:  [facility] Procedure:      VAS [place] LOWER EXTREMITY VENOUS (DVT) Referring Phys: Georga Kaufmann XU --------------------------------------------------------------------------------  Indications: Pain and swelling in right calf. S/P right meniscus tear repair on  11/21/21.  Comparison Study: No priors. Performing Technologist: Oda Cogan RDMS, RVT  Examination Guidelines: A complete evaluation includes B-mode imaging, spectral Doppler, color Doppler, and power Doppler as needed of all accessible portions of each vessel. Bilateral testing is considered an integral part of a complete examination. Limited examinations for reoccurring indications may be performed as noted. The reflux portion of the exam is performed with the patient in reverse Trendelenburg.  +---------+---------------+---------+-----------+----------+--------------+ RIGHT    CompressibilityPhasicitySpontaneityPropertiesThrombus Aging +---------+---------------+---------+-----------+----------+--------------+ CFV      Full           Yes      Yes                                 +---------+---------------+---------+-----------+----------+--------------+ SFJ      Full                                                        +---------+---------------+---------+-----------+----------+--------------+ FV Prox  Full                                                        +---------+---------------+---------+-----------+----------+--------------+ FV Mid   Full                                                        +---------+---------------+---------+-----------+----------+--------------+ FV DistalFull                                                        +---------+---------------+---------+-----------+----------+--------------+ PFV  Full                                                        +---------+---------------+---------+-----------+----------+--------------+ POP      Full           Yes      Yes                                 +---------+---------------+---------+-----------+----------+--------------+ PTV      Full                                                        +---------+---------------+---------+-----------+----------+--------------+  PERO     Full                                                        +---------+---------------+---------+-----------+----------+--------------+   +----+---------------+---------+-----------+----------+--------------+ LEFTCompressibilityPhasicitySpontaneityPropertiesThrombus Aging +----+---------------+---------+-----------+----------+--------------+ CFV Full           Yes      Yes                                 +----+---------------+---------+-----------+----------+--------------+ SFJ Full                                                        +----+---------------+---------+-----------+----------+--------------+     Summary: RIGHT: - No evidence of common femoral vein obstruction.  LEFT: - There is no evidence of deep vein thrombosis in the lower extremity.  - A complex, hypoechoic cystic structure is found in the popliteal fossa measuring 4.5 x 2.9 cm.  *See table(s) above for measurements and observations. Electronically signed by Orlie Pollen on 12/13/2021 at 4:47:48 PM.    Final     Recent Labs: Lab Results  Component Value Date   WBC 6.2 10/30/2021   HGB 13.8 10/30/2021   PLT 211 10/30/2021   NA 139 10/30/2021   K 4.4 10/30/2021   CL 100 10/30/2021   CO2 28 10/30/2021   GLUCOSE 121 (H) 10/30/2021   BUN 7 10/30/2021   CREATININE 1.00 10/30/2021   BILITOT 0.5 10/30/2021   ALKPHOS 71 09/26/2016   AST 24 10/30/2021   ALT 18 10/30/2021   PROT 6.6 10/30/2021   ALBUMIN 4.2 09/26/2016   CALCIUM 9.1 10/30/2021   GFRAA 75 07/11/2020     Speciality Comments: No specialty comments available.  Procedures:  No procedures performed Allergies: Codeine, Crestor [rosuvastatin], Lipitor [atorvastatin], and Zetia [ezetimibe]   Assessment / Plan:     Visit Diagnoses: Idiopathic chronic gout of multiple sites with tophus - uric acid: 5.8 on 10/30/2021.  Uric acid was 5.7 on December 12, 2021.  Patient has been taking allopurinol 200 mg p.o. daily without any side  effects.   He has not had any gout flare.  He has been monitoring his diet.  Recent labs obtained by his PCP were reviewed.  CBC and CMP were stable. December 12, 2021 CBC WBC 6.9, hemoglobin 13.0, platelets 215, CMP with GFR normal AST 25 ALT 24, GFR 91, hemoglobin A1c 5.5, PSA normal, uric acid 5.7  Medication management - Allopurinol 200 mg by mouth daily  Effusion, right knee - right knee joint was aspirated and injected on July 12, 2021 and on 10/30/2021.Oct 30, 2021 the synovial fluid WBC 231, crystals negative, uric acid 5.8, RF negative, anti-CCP negative, sed rate 6.  Patient underwent right knee joint meniscal tear repair by Dr. Erlinda Hong about 6 weeks ago.  He continues to have pain and swelling in his right knee joint.  He states the pain is constant and he is having nocturnal pain and difficulty walking.  He also has a Baker's cyst.  He states that Dr. Erlinda Hong acknowledged the Baker's cyst and plans to aspirate his knee joint towards the end of August at the follow-up visit.  Patient is disappointed about the response to the surgery.  I advised him to contact Dr. Erlinda Hong to discuss possible use of prednisone to reduce swelling as he cannot have a cortisone injection.  Chronic pain of right knee-he has ongoing pain and swelling in his right knee joint.  Primary osteoarthritis of both feet-he has off-and-on discomfort in his feet.  Proper fitting shoes were discussed.  DDD (degenerative disc disease), cervical-he continues to have some stiffness in his cervical spine especially with the lateral rotation.  Stretching exercises were discussed.  Primary insomnia-he takes Ambien 10 mg p.o. nightly as needed for insomnia.  History of hyperlipidemia-he had intolerance to statins.  He is taking Zetia.  History of gastroesophageal reflux (GERD)-he is on Protonix.  History of hypertension-blood pressure is well-controlled on lisinopril.  Orders: No orders of the defined types were placed in this encounter.  No orders of  the defined types were placed in this encounter.  Face-to-face time spent patient was 30 minutes.  Greater than 50% time was spent in counseling and coordination of care.  Follow-Up Instructions: Return in about 3 months (around 04/11/2022) for Osteoarthritis, Gout.   Bo Merino, MD  Note - This record has been created using Editor, commissioning.  Chart creation errors have been sought, but may not always  have been located. Such creation errors do not reflect on  the standard of medical care.

## 2021-12-27 ENCOUNTER — Ambulatory Visit (INDEPENDENT_AMBULATORY_CARE_PROVIDER_SITE_OTHER): Payer: Medicare Other | Admitting: Cardiology

## 2021-12-27 ENCOUNTER — Encounter: Payer: Self-pay | Admitting: Cardiology

## 2021-12-27 VITALS — BP 118/66 | HR 74 | Ht 69.0 in | Wt 165.0 lb

## 2021-12-27 DIAGNOSIS — E785 Hyperlipidemia, unspecified: Secondary | ICD-10-CM

## 2021-12-27 DIAGNOSIS — I10 Essential (primary) hypertension: Secondary | ICD-10-CM

## 2021-12-27 DIAGNOSIS — R079 Chest pain, unspecified: Secondary | ICD-10-CM | POA: Diagnosis not present

## 2021-12-27 DIAGNOSIS — I2510 Atherosclerotic heart disease of native coronary artery without angina pectoris: Secondary | ICD-10-CM | POA: Diagnosis not present

## 2021-12-27 DIAGNOSIS — E782 Mixed hyperlipidemia: Secondary | ICD-10-CM

## 2021-12-27 NOTE — Patient Instructions (Signed)
Medication Instructions:  Your physician recommends that you continue on your current medications as directed. Please refer to the Current Medication list given to you today.   Labwork: None today  Testing/Procedures: None today  Follow-Up: 1 year  Any Other Special Instructions Will Be Listed Below (If Applicable).   You have been referred to [facility].   If you need a refill on your cardiac medications before your next appointment, please call your pharmacy.

## 2022-01-02 ENCOUNTER — Ambulatory Visit (INDEPENDENT_AMBULATORY_CARE_PROVIDER_SITE_OTHER): Payer: Medicare Other

## 2022-01-02 DIAGNOSIS — J309 Allergic rhinitis, unspecified: Secondary | ICD-10-CM | POA: Diagnosis not present

## 2022-01-03 ENCOUNTER — Other Ambulatory Visit: Payer: Self-pay | Admitting: *Deleted

## 2022-01-03 NOTE — Patient Outreach (Signed)
  Care Coordination   01/03/2022 Name: Dennis Miles MRN: [id number] DOB: [birth year]   Care Coordination Outreach Attempts:  An unsuccessful telephone outreach was attempted today to offer the patient information about available care coordination services as a benefit of their health plan.   Follow Up Plan:  Additional outreach attempts will be made to offer the patient care coordination information and services.   Encounter Outcome:  No Answer  Care Coordination Interventions Activated:  No   Care Coordination Interventions:  No, not indicated    Emelia Loron RN, BSN [place] [phone] [email or web address]

## 2022-01-09 ENCOUNTER — Ambulatory Visit: Payer: Medicare Other | Attending: Rheumatology | Admitting: Rheumatology

## 2022-01-09 ENCOUNTER — Telehealth: Payer: Self-pay | Admitting: Orthopaedic Surgery

## 2022-01-09 ENCOUNTER — Encounter: Payer: Self-pay | Admitting: Rheumatology

## 2022-01-09 VITALS — BP 121/82 | HR 76 | Resp 14 | Ht 68.0 in | Wt 165.0 lb

## 2022-01-09 DIAGNOSIS — M19071 Primary osteoarthritis, right ankle and foot: Secondary | ICD-10-CM | POA: Diagnosis not present

## 2022-01-09 DIAGNOSIS — M19072 Primary osteoarthritis, left ankle and foot: Secondary | ICD-10-CM | POA: Insufficient documentation

## 2022-01-09 DIAGNOSIS — Z8639 Personal history of other endocrine, nutritional and metabolic disease: Secondary | ICD-10-CM | POA: Insufficient documentation

## 2022-01-09 DIAGNOSIS — F5101 Primary insomnia: Secondary | ICD-10-CM | POA: Diagnosis not present

## 2022-01-09 DIAGNOSIS — M5030 Other cervical disc degeneration, unspecified cervical region: Secondary | ICD-10-CM | POA: Insufficient documentation

## 2022-01-09 DIAGNOSIS — M25461 Effusion, right knee: Secondary | ICD-10-CM | POA: Insufficient documentation

## 2022-01-09 DIAGNOSIS — M25561 Pain in right knee: Secondary | ICD-10-CM | POA: Insufficient documentation

## 2022-01-09 DIAGNOSIS — Z8719 Personal history of other diseases of the digestive system: Secondary | ICD-10-CM | POA: Insufficient documentation

## 2022-01-09 DIAGNOSIS — I2510 Atherosclerotic heart disease of native coronary artery without angina pectoris: Secondary | ICD-10-CM

## 2022-01-09 DIAGNOSIS — G8929 Other chronic pain: Secondary | ICD-10-CM | POA: Diagnosis not present

## 2022-01-09 DIAGNOSIS — Z79899 Other long term (current) drug therapy: Secondary | ICD-10-CM | POA: Diagnosis not present

## 2022-01-09 DIAGNOSIS — Z8679 Personal history of other diseases of the circulatory system: Secondary | ICD-10-CM | POA: Diagnosis not present

## 2022-01-09 DIAGNOSIS — M1A09X1 Idiopathic chronic gout, multiple sites, with tophus (tophi): Secondary | ICD-10-CM | POA: Insufficient documentation

## 2022-01-09 NOTE — Telephone Encounter (Signed)
Patient walked into the office and states that his right knee is swollen and wanted to know if he can take a oral prednisone.    Patient request a call back @ [phone]

## 2022-01-09 NOTE — Telephone Encounter (Signed)
It would be best to aspirate and inject with cortisone

## 2022-01-10 ENCOUNTER — Other Ambulatory Visit: Payer: Self-pay

## 2022-01-10 ENCOUNTER — Encounter: Payer: Medicare Other | Admitting: Orthopaedic Surgery

## 2022-01-10 ENCOUNTER — Ambulatory Visit (INDEPENDENT_AMBULATORY_CARE_PROVIDER_SITE_OTHER): Payer: Medicare Other | Admitting: Orthopaedic Surgery

## 2022-01-10 DIAGNOSIS — M25561 Pain in right knee: Secondary | ICD-10-CM | POA: Diagnosis not present

## 2022-01-10 DIAGNOSIS — Z9889 Other specified postprocedural states: Secondary | ICD-10-CM

## 2022-01-10 MED ORDER — BUPIVACAINE HCL 0.5 % IJ SOLN
2.0000 mL | INTRAMUSCULAR | Status: AC | PRN
  Administered 2022-01-10: 2 mL via INTRA_ARTICULAR

## 2022-01-10 MED ORDER — LIDOCAINE HCL 1 % IJ SOLN
2.0000 mL | INTRAMUSCULAR | Status: AC | PRN
  Administered 2022-01-10: 2 mL

## 2022-01-10 MED ORDER — METHYLPREDNISOLONE ACETATE 40 MG/ML IJ SUSP
40.0000 mg | INTRAMUSCULAR | Status: AC | PRN
  Administered 2022-01-10: 40 mg via INTRA_ARTICULAR

## 2022-01-10 NOTE — Progress Notes (Signed)
Office Visit Note   Patient: Dennis Miles           Date of Birth: Dec 01, 1950           MRN: [id number] Visit Date: 01/10/2022              Requested by: Celene Squibb, MD [place],  [place] [address] PCP: Celene Squibb, MD   Assessment & Plan: Visit Diagnoses:  1. S/P arthroscopy of right knee     Plan: Mr. Parke is status post right knee arthroscopy lateral meniscal debridement in June.  Comes back with large joint effusion.  He has been quite active.  Denies any constitutional symptoms.  The effusion limits him quite a bit.  Examination right knee shows a large joint effusion.  No signs of infection.  Slight limitation range of motion secondary to the effusion.  Impression is large right knee joint effusion status post knee arthroscopy about 2 months ago.  The knee was aspirated and injected with cortisone today.  The fluid was sent to the lab.  We got about 40 cc of synovial fluid.  He will back off on some of his activity until symptoms improve.  Follow-Up Instructions: No follow-ups on file.   Orders:  No orders of the defined types were placed in this encounter.  No orders of the defined types were placed in this encounter.     Procedures: Large Joint Inj: R knee on 01/10/2022 9:02 AM Indications: pain Details: 22 G needle  Arthrogram: No  Medications: 40 mg methylPREDNISolone acetate 40 MG/ML; 2 mL lidocaine 1 %; 2 mL bupivacaine 0.5 % Consent was given by the patient. Patient was prepped and draped in the usual sterile fashion.       Clinical Data: No additional findings.   Subjective: Chief Complaint  Patient presents with   Right Knee - Pain    HPI  Review of Systems   Objective: Vital Signs: There were no vitals taken for this visit.  Physical Exam  Ortho Exam  Specialty Comments:  No specialty comments available.  Imaging: No results found.   PMFS History: Patient Active Problem List   Diagnosis Date Noted    S/P arthroscopy of right knee 12/25/2021   Acute lateral meniscus tear of right knee 11/21/2021   Seasonal and perennial allergic rhinitis 08/65/7846   Eosinophilic esophagitis 96/29/5284   Esophageal dysphagia 05/14/2021   Abdominal pain, epigastric 05/14/2021   IBS (irritable bowel syndrome) 11/13/2020   Primary insomnia 09/26/2016   DJD (degenerative joint disease), cervical 09/26/2016   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   Abdominal pain, left lower quadrant 10/19/2012   Gastroesophageal reflux disease 04/15/2012   Gout 04/01/2011   Hypertension 04/01/2011   Elevated cholesterol with high triglycerides 04/01/2011   Past Medical History:  Diagnosis Date   Anxiety    GERD (gastroesophageal reflux disease)    Gout    Headache(784.0)    Hiatal hernia    High cholesterol    Hypertension    Skin cancer     Family History  Problem Relation Age of Onset   Aneurysm Father    Asthma Child    Colon cancer Neg Hx     Past Surgical History:  Procedure Laterality Date   APPENDECTOMY     BIOPSY  05/30/2021   Procedure: BIOPSY;  Surgeon: Rogene Houston, MD;  [facility];  Service: Endoscopy;;   COLONOSCOPY N/A  10/23/2012   Procedure: COLONOSCOPY;  Surgeon: Rogene Houston, MD;  [facility];  Service: Endoscopy;  Laterality: N/A;  235   ESOPHAGEAL DILATION N/A 05/30/2021   Procedure: ESOPHAGEAL DILATION;  Surgeon: Rogene Houston, MD;  [facility];  Service: Endoscopy;  Laterality: N/A;   ESOPHAGOGASTRODUODENOSCOPY  04/17/2011   Procedure: ESOPHAGOGASTRODUODENOSCOPY (EGD);  Surgeon: Rogene Houston, MD;  [facility];  Service: Endoscopy;  Laterality: N/A;  8:30   ESOPHAGOGASTRODUODENOSCOPY (EGD) WITH PROPOFOL N/A 05/30/2021   Procedure: ESOPHAGOGASTRODUODENOSCOPY (EGD) WITH PROPOFOL;  Surgeon: Rogene Houston, MD;  [facility];  Service: Endoscopy;  Laterality: N/A;  7:30   HAND SURGERY   05/28/2016   INGUINAL HERNIA REPAIR Left 02/17/2013   Procedure: HERNIA REPAIR INGUINAL ADULT;  Surgeon: Jamesetta So, MD;  [facility];  Service: General;  Laterality: Left;   INSERTION OF MESH Left 02/17/2013   Procedure: INSERTION OF MESH;  Surgeon: Jamesetta So, MD;  Location: [facility];  Service: General;  Laterality: Left;   KNEE ARTHROSCOPY W/ MENISCAL REPAIR Right 11/2021   SINOSCOPY     sinus sugery     SKIN CANCER EXCISION     x4   SQUAMOUS CELL CARCINOMA EXCISION Left 2023   forearm   Social History   Occupational History   Not on file  Tobacco Use   Smoking status: Never    Passive exposure: Never   Smokeless tobacco: Former    Types: Chew    Quit date: 06/03/2012  Vaping Use   Vaping Use: Never used  Substance and Sexual Activity   Alcohol use: Yes    Alcohol/week: 1.0 standard drink of alcohol    Types: 1 Cans of beer per week    Comment: Social   Drug use: Never   Sexual activity: Not on file

## 2022-01-10 NOTE — Addendum Note (Signed)
Addended by: Jacklyn Shell on: 01/10/2022 01:16 PM   Modules accepted: Orders

## 2022-01-10 NOTE — Addendum Note (Signed)
Addended by: Jacklyn Shell on: 01/10/2022 01:27 PM   Modules accepted: Orders

## 2022-01-11 LAB — SYNOVIAL FLUID ANALYSIS, COMPLETE
Basophils, %: 0 %
Eosinophils-Synovial: 0 % (ref 0–2)
Lymphocytes-Synovial Fld: 51 % (ref 0–74)
Monocyte/Macrophage: 36 % (ref 0–69)
Neutrophil, Synovial: 13 % (ref 0–24)
Synoviocytes, %: 0 % (ref 0–15)
WBC, Synovial: 227 cells/uL — ABNORMAL HIGH (ref <150)

## 2022-01-11 LAB — TIQ-NTM

## 2022-01-16 ENCOUNTER — Ambulatory Visit (INDEPENDENT_AMBULATORY_CARE_PROVIDER_SITE_OTHER): Payer: Medicare Other

## 2022-01-16 DIAGNOSIS — J309 Allergic rhinitis, unspecified: Secondary | ICD-10-CM | POA: Diagnosis not present

## 2022-01-17 DIAGNOSIS — H35371 Puckering of macula, right eye: Secondary | ICD-10-CM | POA: Diagnosis not present

## 2022-01-17 DIAGNOSIS — H3589 Other specified retinal disorders: Secondary | ICD-10-CM | POA: Diagnosis not present

## 2022-01-17 DIAGNOSIS — H43811 Vitreous degeneration, right eye: Secondary | ICD-10-CM | POA: Diagnosis not present

## 2022-01-17 DIAGNOSIS — H43822 Vitreomacular adhesion, left eye: Secondary | ICD-10-CM | POA: Diagnosis not present

## 2022-01-23 ENCOUNTER — Ambulatory Visit (INDEPENDENT_AMBULATORY_CARE_PROVIDER_SITE_OTHER): Payer: Medicare Other

## 2022-01-23 DIAGNOSIS — J309 Allergic rhinitis, unspecified: Secondary | ICD-10-CM

## 2022-01-30 ENCOUNTER — Ambulatory Visit (INDEPENDENT_AMBULATORY_CARE_PROVIDER_SITE_OTHER): Payer: Medicare Other

## 2022-01-30 ENCOUNTER — Ambulatory Visit: Payer: Medicare Other | Admitting: Physician Assistant

## 2022-01-30 DIAGNOSIS — J309 Allergic rhinitis, unspecified: Secondary | ICD-10-CM

## 2022-01-31 ENCOUNTER — Ambulatory Visit (INDEPENDENT_AMBULATORY_CARE_PROVIDER_SITE_OTHER): Payer: Medicare Other | Admitting: Physician Assistant

## 2022-01-31 ENCOUNTER — Encounter: Payer: Self-pay | Admitting: Orthopaedic Surgery

## 2022-01-31 DIAGNOSIS — L570 Actinic keratosis: Secondary | ICD-10-CM | POA: Diagnosis not present

## 2022-01-31 DIAGNOSIS — L578 Other skin changes due to chronic exposure to nonionizing radiation: Secondary | ICD-10-CM | POA: Diagnosis not present

## 2022-01-31 DIAGNOSIS — Z9889 Other specified postprocedural states: Secondary | ICD-10-CM

## 2022-01-31 DIAGNOSIS — C44222 Squamous cell carcinoma of skin of right ear and external auricular canal: Secondary | ICD-10-CM | POA: Diagnosis not present

## 2022-01-31 DIAGNOSIS — D485 Neoplasm of uncertain behavior of skin: Secondary | ICD-10-CM | POA: Diagnosis not present

## 2022-01-31 NOTE — Progress Notes (Signed)
VIALS EXP 02-01-23

## 2022-01-31 NOTE — Progress Notes (Signed)
Post-Op Visit Note   Patient: Dennis Miles           Date of Birth: 1951-01-07           MRN: [id number] Visit Date: 01/31/2022 PCP: Celene Squibb, MD   Assessment & Plan:  Chief Complaint:  Chief Complaint  Patient presents with   Right Knee - Follow-up   Visit Diagnoses:  1. S/P right knee arthroscopy     Plan: Patient is a pleasant 71 year old gentleman who comes in today little over 2 months out right knee arthroscopic debridement lateral meniscus and chondroplasty, 11/29/21.  It was noted during operative invention he had grade 3 changes to the lateral patellofemoral compartments.  He was seen in our office about 3 weeks ago where he had a fairly large joint effusion.  This was aspirated and then injected with cortisone.  He feels significantly better.  He has been doing a lot of walking without recurrent effusion.  He notes just a very occasional twinge with pivoting.  Overall, doing well.  Examination of his right knee reveals a very small effusion.  Range of motion 0 to 120 degrees.  He is neurovascular intact distally.  At this point, he will continue to advance with activity as tolerated.  We did discuss repeat cortisone injection in the future if needed as well as viscosupplementation injection.  He will follow-up as needed.  Call with concerns or questions.  Follow-Up Instructions: Return if symptoms worsen or fail to improve.   Orders:  No orders of the defined types were placed in this encounter.  No orders of the defined types were placed in this encounter.   Imaging: No new imaging  PMFS History: Patient Active Problem List   Diagnosis Date Noted   S/P arthroscopy of right knee 12/25/2021   Acute lateral meniscus tear of right knee 11/21/2021   Seasonal and perennial allergic rhinitis 20/25/4270   Eosinophilic esophagitis 62/37/6283   Esophageal dysphagia 05/14/2021   Abdominal pain, epigastric 05/14/2021   IBS (irritable bowel syndrome) 11/13/2020    Primary insomnia 09/26/2016   DJD (degenerative joint disease), cervical 09/26/2016   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   Abdominal pain, left lower quadrant 10/19/2012   Gastroesophageal reflux disease 04/15/2012   Gout 04/01/2011   Hypertension 04/01/2011   Elevated cholesterol with high triglycerides 04/01/2011   Past Medical History:  Diagnosis Date   Anxiety    GERD (gastroesophageal reflux disease)    Gout    Headache(784.0)    Hiatal hernia    High cholesterol    Hypertension    Skin cancer     Family History  Problem Relation Age of Onset   Aneurysm Father    Asthma Child    Colon cancer Neg Hx     Past Surgical History:  Procedure Laterality Date   APPENDECTOMY     BIOPSY  05/30/2021   Procedure: BIOPSY;  Surgeon: Rogene Houston, MD;  [facility];  Service: Endoscopy;;   COLONOSCOPY N/A 10/23/2012   Procedure: COLONOSCOPY;  Surgeon: Rogene Houston, MD;  [facility];  Service: Endoscopy;  Laterality: N/A;  235   ESOPHAGEAL DILATION N/A 05/30/2021   Procedure: ESOPHAGEAL DILATION;  Surgeon: Rogene Houston, MD;  [facility];  Service: Endoscopy;  Laterality: N/A;   ESOPHAGOGASTRODUODENOSCOPY  04/17/2011   Procedure: ESOPHAGOGASTRODUODENOSCOPY (EGD);  Surgeon: Rogene Houston, MD;  [facility];  Service: Endoscopy;  Laterality: N/A;  8:30   ESOPHAGOGASTRODUODENOSCOPY (EGD) WITH PROPOFOL N/A 05/30/2021   Procedure: ESOPHAGOGASTRODUODENOSCOPY (EGD) WITH PROPOFOL;  Surgeon: Rogene Houston, MD;  [facility];  Service: Endoscopy;  Laterality: N/A;  7:30   HAND SURGERY  05/28/2016   INGUINAL HERNIA REPAIR Left 02/17/2013   Procedure: HERNIA REPAIR INGUINAL ADULT;  Surgeon: Jamesetta So, MD;  Location: [facility];  Service: General;  Laterality: Left;   INSERTION OF MESH Left 02/17/2013   Procedure: INSERTION OF MESH;  Surgeon: Jamesetta So, MD;  Location: [facility];  Service:  General;  Laterality: Left;   KNEE ARTHROSCOPY W/ MENISCAL REPAIR Right 11/2021   SINOSCOPY     sinus sugery     SKIN CANCER EXCISION     x4   SQUAMOUS CELL CARCINOMA EXCISION Left 2023   forearm   Social History   Occupational History   Not on file  Tobacco Use   Smoking status: Never    Passive exposure: Never   Smokeless tobacco: Former    Types: Chew    Quit date: 06/03/2012  Vaping Use   Vaping Use: Never used  Substance and Sexual Activity   Alcohol use: Yes    Alcohol/week: 1.0 standard drink of alcohol    Types: 1 Cans of beer per week    Comment: Social   Drug use: Never   Sexual activity: Not on file

## 2022-02-01 DIAGNOSIS — J3081 Allergic rhinitis due to animal (cat) (dog) hair and dander: Secondary | ICD-10-CM | POA: Diagnosis not present

## 2022-02-04 DIAGNOSIS — J3089 Other allergic rhinitis: Secondary | ICD-10-CM | POA: Diagnosis not present

## 2022-02-06 ENCOUNTER — Ambulatory Visit (INDEPENDENT_AMBULATORY_CARE_PROVIDER_SITE_OTHER): Payer: Medicare Other

## 2022-02-06 DIAGNOSIS — J309 Allergic rhinitis, unspecified: Secondary | ICD-10-CM | POA: Diagnosis not present

## 2022-02-07 DIAGNOSIS — L988 Other specified disorders of the skin and subcutaneous tissue: Secondary | ICD-10-CM | POA: Diagnosis not present

## 2022-02-07 DIAGNOSIS — C44629 Squamous cell carcinoma of skin of left upper limb, including shoulder: Secondary | ICD-10-CM | POA: Diagnosis not present

## 2022-02-20 ENCOUNTER — Ambulatory Visit (INDEPENDENT_AMBULATORY_CARE_PROVIDER_SITE_OTHER): Payer: Medicare Other

## 2022-02-20 DIAGNOSIS — J309 Allergic rhinitis, unspecified: Secondary | ICD-10-CM | POA: Diagnosis not present

## 2022-02-21 DIAGNOSIS — C44222 Squamous cell carcinoma of skin of right ear and external auricular canal: Secondary | ICD-10-CM | POA: Diagnosis not present

## 2022-03-01 ENCOUNTER — Ambulatory Visit (INDEPENDENT_AMBULATORY_CARE_PROVIDER_SITE_OTHER): Payer: Medicare Other

## 2022-03-01 DIAGNOSIS — J309 Allergic rhinitis, unspecified: Secondary | ICD-10-CM

## 2022-03-07 ENCOUNTER — Other Ambulatory Visit: Payer: Self-pay | Admitting: *Deleted

## 2022-03-07 NOTE — Patient Outreach (Signed)
  Care Coordination   03/07/2022  Name: Dennis Miles MRN: [id number] DOB: 1950/07/29   Care Coordination Outreach Attempts:  A second unsuccessful outreach was attempted today to offer the patient with information about available care coordination services as a benefit of their health plan.   HIPAA compliant messages left on voicemail, providing contact information for CSW, encouraging patient to return CSW's call at his earliest convenience.  Follow Up Plan:  Additional outreach attempts will be made to offer the patient care coordination information and services.   Encounter Outcome:  No Answer.   Care Coordination Interventions Activated:  No.    Care Coordination Interventions:  No, not indicated.    Nat Christen, BSW, MSW, LCSW  Licensed Education officer, [facility]  Mailing [place] [address] [place], [place] [address] Physical Address[address] [place], [place] [address] Toll Free Main # [phone] Fax # [phone] Cell # [phone] [email or web address]

## 2022-03-08 ENCOUNTER — Ambulatory Visit (INDEPENDENT_AMBULATORY_CARE_PROVIDER_SITE_OTHER): Payer: Medicare Other

## 2022-03-08 DIAGNOSIS — J309 Allergic rhinitis, unspecified: Secondary | ICD-10-CM

## 2022-03-15 ENCOUNTER — Other Ambulatory Visit: Payer: Self-pay | Admitting: *Deleted

## 2022-03-15 ENCOUNTER — Ambulatory Visit (INDEPENDENT_AMBULATORY_CARE_PROVIDER_SITE_OTHER): Payer: Medicare Other

## 2022-03-15 DIAGNOSIS — J309 Allergic rhinitis, unspecified: Secondary | ICD-10-CM | POA: Diagnosis not present

## 2022-03-15 NOTE — Patient Outreach (Signed)
  Care Coordination   03/15/2022  Name: Dennis Miles MRN: [id number] DOB: November 15, 1950  Care Coordination Outreach Attempts:  A third unsuccessful outreach was attempted today to offer the patient with information about available care coordination services as a benefit of their health plan. HIPAA compliant messages left on voicemail, providing contact information for CSW, encouraging patient to return CSW's call at his earliest convenience.    Follow Up Plan:  No further outreach attempts will be made at this time. We have been unable to contact the patient to offer or enroll patient in care coordination services.  Encounter Outcome:  No Answer.   Care Coordination Interventions Activated:  No.    Care Coordination Interventions:  No, not indicated.    Nat Christen, BSW, MSW, LCSW  Licensed Education officer, environmental [facility]  Mailing [place] [address] [place], [place] [address] Physical Address[address] [place],  [address] Toll Free Main # [phone] Fax # [phone] Cell # [phone] [email or web address]

## 2022-03-19 DIAGNOSIS — Z23 Encounter for immunization: Secondary | ICD-10-CM | POA: Diagnosis not present

## 2022-03-22 ENCOUNTER — Ambulatory Visit (INDEPENDENT_AMBULATORY_CARE_PROVIDER_SITE_OTHER): Payer: Medicare Other

## 2022-03-22 DIAGNOSIS — J309 Allergic rhinitis, unspecified: Secondary | ICD-10-CM | POA: Diagnosis not present

## 2022-03-29 NOTE — Progress Notes (Unsigned)
Office Visit Note  Patient: Dennis Miles             Date of Birth: Oct 09, 1950           MRN: [id number]             PCP: Celene Squibb, MD Referring: Celene Squibb, MD Visit Date: 04/11/2022 Occupation: '@GUAROCC'$ @  Subjective:  Right knee joint pain and swelling   History of Present Illness: Dennis Miles is a 71 y.o. male with history of gout, osteoarthritis, and DDD.  Patient remains on Allopurinol 200 mg by mouth daily for management of gout.  He continues to tolerate allopurinol without any side effects and has not missed any doses recently.  He has not had any signs or symptoms of a gout flare.  He continues to have ongoing pain and swelling in the left knee joint.  He underwent right knee arthroscopic surgery in May 2023 and has had persistent discomfort since then.  He has been under the care of Dr. Erlinda Hong for management of right knee joint pain and recurrent effusions.  He had an aspiration and cortisone injection on 01/10/22.  He continues to have some difficulty ambulating due to the pain and stiffness.  He will be establishing care with Dr. Wynelle Link next week for a second opinion.  He denies any other joint pain or joint swelling. He has been trying to avoid the use of NSAIDs.     Activities of Daily Living:  Patient reports morning stiffness for 0 minutes.   Patient Reports nocturnal pain.  Difficulty dressing/grooming: Denies Difficulty climbing stairs: Denies Difficulty getting out of chair: Denies Difficulty using hands for taps, buttons, cutlery, and/or writing: Denies  Review of Systems  Constitutional:  Positive for fatigue. Negative for night sweats.  HENT:  Positive for mouth dryness. Negative for mouth sores and nose dryness.   Eyes:  Negative for redness and dryness.  Respiratory:  Negative for shortness of breath and difficulty breathing.   Cardiovascular:  Positive for chest pain. Negative for palpitations, hypertension, irregular heartbeat and swelling in  legs/feet.  Gastrointestinal:  Positive for constipation and diarrhea.  Endocrine: Negative for increased urination.  Genitourinary:  Negative for painful urination.  Musculoskeletal:  Positive for joint pain, joint pain, joint swelling, myalgias, muscle tenderness and myalgias. Negative for muscle weakness and morning stiffness.  Skin:  Negative for color change, rash, hair loss, nodules/bumps, skin tightness, ulcers and sensitivity to sunlight.  Allergic/Immunologic: Negative for susceptible to infections.  Neurological:  Positive for dizziness and headaches. Negative for fainting, memory loss, night sweats and weakness.  Hematological:  Negative for swollen glands.  Psychiatric/Behavioral:  Negative for depressed mood and sleep disturbance. The patient is not nervous/anxious.     PMFS History:  Patient Active Problem List   Diagnosis Date Noted   S/P arthroscopy of right knee 12/25/2021   Acute lateral meniscus tear of right knee 11/21/2021   Seasonal and perennial allergic rhinitis 68/04/5725   Eosinophilic esophagitis 20/35/5974   Esophageal dysphagia 05/14/2021   Abdominal pain, epigastric 05/14/2021   IBS (irritable bowel syndrome) 11/13/2020   Primary insomnia 09/26/2016   DJD (degenerative joint disease), cervical 09/26/2016   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   Abdominal pain, left lower quadrant 10/19/2012   Gastroesophageal reflux disease 04/15/2012   Gout 04/01/2011   Hypertension 04/01/2011   Elevated cholesterol with high triglycerides 04/01/2011    Past Medical History:  Diagnosis Date   Anxiety  GERD (gastroesophageal reflux disease)    Gout    Headache(784.0)    Hiatal hernia    High cholesterol    Hypertension    Skin cancer     Family History  Problem Relation Age of Onset   Aneurysm Father    Asthma Child    Colon cancer Neg Hx    Past Surgical History:  Procedure Laterality Date   APPENDECTOMY     BIOPSY   05/30/2021   Procedure: BIOPSY;  Surgeon: Rogene Houston, MD;  [facility];  Service: Endoscopy;;   COLONOSCOPY N/A 10/23/2012   Procedure: COLONOSCOPY;  Surgeon: Rogene Houston, MD;  [facility];  Service: Endoscopy;  Laterality: N/A;  235   ESOPHAGEAL DILATION N/A 05/30/2021   Procedure: ESOPHAGEAL DILATION;  Surgeon: Rogene Houston, MD;  [facility];  Service: Endoscopy;  Laterality: N/A;   ESOPHAGOGASTRODUODENOSCOPY  04/17/2011   Procedure: ESOPHAGOGASTRODUODENOSCOPY (EGD);  Surgeon: Rogene Houston, MD;  [facility];  Service: Endoscopy;  Laterality: N/A;  8:30   ESOPHAGOGASTRODUODENOSCOPY (EGD) WITH PROPOFOL N/A 05/30/2021   Procedure: ESOPHAGOGASTRODUODENOSCOPY (EGD) WITH PROPOFOL;  Surgeon: Rogene Houston, MD;  [facility];  Service: Endoscopy;  Laterality: N/A;  7:30   HAND SURGERY  05/28/2016   INGUINAL HERNIA REPAIR Left 02/17/2013   Procedure: HERNIA REPAIR INGUINAL ADULT;  Surgeon: Jamesetta So, MD;  [facility]: [facility];  Service: General;  Laterality: Left;   INSERTION OF MESH Left 02/17/2013   Procedure: INSERTION OF MESH;  Surgeon: Jamesetta So, MD;  [facility];  Service: General;  Laterality: Left;   KNEE ARTHROSCOPY W/ MENISCAL REPAIR Right 11/2021   SINOSCOPY     sinus sugery     SKIN CANCER EXCISION     x4   SQUAMOUS CELL CARCINOMA EXCISION Left 2023   forearm   Social History   Social History Narrative   Lives at home with daughter.  Retired.  Education 12th grade.  Children 2.     Immunization History  Administered Date(s) Administered   PFIZER(Purple Top)SARS-COV-2 Vaccination 08/02/2019, 08/23/2019, 06/22/2020     Objective: Vital Signs: BP 135/80 (BP Location: Left Arm, Patient Position: Sitting, Cuff Size: Normal)   Pulse 80   Resp 15   Ht '5\' 9"'$  (1.753 m)   Wt 165 lb 3.2 oz (74.9 kg)   BMI 24.40 kg/m    Physical Exam Vitals and nursing note reviewed.  Constitutional:       Appearance: He is well-developed.  HENT:     Head: Normocephalic and atraumatic.  Eyes:     Conjunctiva/sclera: Conjunctivae normal.     Pupils: Pupils are equal, round, and reactive to light.  Cardiovascular:     Rate and Rhythm: Normal rate and regular rhythm.     Heart sounds: Normal heart sounds.  Pulmonary:     Effort: Pulmonary effort is normal.     Breath sounds: Normal breath sounds.  Abdominal:     General: Bowel sounds are normal.     Palpations: Abdomen is soft.  Musculoskeletal:     Cervical back: Normal range of motion and neck supple.  Skin:    General: Skin is warm and dry.     Capillary Refill: Capillary refill takes less than 2 seconds.  Neurological:     Mental Status: He is alert and oriented to person, place, and time.  Psychiatric:        Behavior: Behavior normal.  Musculoskeletal Exam: C-spine has limited range of motion with lateral rotation.  Shoulder joints and elbow joints have good ROM.  Slightly limited extension of both wrists. MCPs, PIPs, DIPs have good range of motion with no synovitis.  PIP and DIP thickening consistent with osteoarthritis of both hands.  Hip joints have good range of motion with no groin pain.  Moderate right knee joint effusion.  Left knee joint has good range of motion with no warmth or effusion.  Ankle joints have good range of motion with no tenderness or joint swelling.  CDAI Exam: CDAI Score: -- Patient Global: --; Provider Global: -- Swollen: --; Tender: -- Joint Exam 04/11/2022   No joint exam has been documented for this visit   There is currently no information documented on the homunculus. Go to the Rheumatology activity and complete the homunculus joint exam.  Investigation: No additional findings.  Imaging: No results found.  Recent Labs: Lab Results  Component Value Date   WBC 6.2 10/30/2021   HGB 13.8 10/30/2021   PLT 211 10/30/2021   NA 139 10/30/2021   K 4.4 10/30/2021   CL 100 10/30/2021    CO2 28 10/30/2021   GLUCOSE 121 (H) 10/30/2021   BUN 7 10/30/2021   CREATININE 1.00 10/30/2021   BILITOT 0.5 10/30/2021   ALKPHOS 71 09/26/2016   AST 24 10/30/2021   ALT 18 10/30/2021   PROT 6.6 10/30/2021   ALBUMIN 4.2 09/26/2016   CALCIUM 9.1 10/30/2021   GFRAA 75 07/11/2020    Speciality Comments: No specialty comments available.  Procedures:  No procedures performed Allergies: Codeine, Crestor [rosuvastatin], Lipitor [atorvastatin], and Zetia [ezetimibe]       Assessment / Plan:     Visit Diagnoses: Idiopathic chronic gout of multiple sites with tophus - He has not had any signs or symptoms of a gout flare.  He has clinically been doing well taking allopurinol 200 mg daily prescribed by Dr. Nevada Crane.  He has been tolerating allopurinol without any side effects and has not missed any doses recently.Uric acid was 5.7 on 12/12/2021.  Uric acid is within the desirable range.  No medication changes will be made at this time.  He was advised to notify us if he develops signs or symptoms of a gout flare.  He will be having updated lab work in January 2024 with Dr. Nevada Crane and will have results forwarded to our office to review.  He will follow-up in the office in 6 months or sooner if needed. - Plan: CBC with Differential/Platelet, COMPLETE METABOLIC PANEL WITH GFR, Uric acid  Medication management - Allopurinol 200 mg by mouth daily.  CBC and CMP were drawn on 12/12/21.  Orders for CBC and CMP released today.   - Plan: CBC with Differential/Platelet, COMPLETE METABOLIC PANEL WITH GFR, Uric acid  Effusion, right knee: Moderate right knee joint effusion noted on examination today.  He will be establishing care with Dr. Wynelle Link next week for a second opinion.  Chronic pain of right knee: He underwent right knee arthroscopic surgery in May 2023 performed by Dr. Erlinda Hong.  He has had persistent pain and recurrent right knee joint effusions since then.  He had a right knee joint aspiration on 01/10/2022 by  Dr. Erlinda Hong.  No crystals noted on synovial analysis.  He continues to have difficulty with ambulation due to severity of pain and swelling in his right knee.  He will be establishing care with Dr. Wynelle Link for a second opinion next week.  Primary osteoarthritis  of both feet: He experiences occasional discomfort and stiffness in his feet in the evenings especially if he was up on his feet more throughout the day.  He has not noticed any inflammation.  DDD (degenerative disc disease), cervical: C-spine has limited range of motion with lateral rotation.  Other medical conditions are listed as follows:  Primary insomnia -He takes Ambien 10 mg p.o. nightly as needed for insomnia.  History of gastroesophageal reflux (GERD)  History of hyperlipidemia  History of hypertension  Orders: Orders Placed This Encounter  Procedures   CBC with Differential/Platelet   COMPLETE METABOLIC PANEL WITH GFR   Uric acid   No orders of the defined types were placed in this encounter.     Follow-Up Instructions: Return in about 6 months (around 10/10/2022) for Osteoarthritis, Gout, DDD.   Ofilia Neas, PA-C  Note - This record has been created using Dragon software.  Chart creation errors have been sought, but may not always  have been located. Such creation errors do not reflect on  the standard of medical care.

## 2022-03-30 DIAGNOSIS — R051 Acute cough: Secondary | ICD-10-CM | POA: Diagnosis not present

## 2022-03-30 DIAGNOSIS — Z6824 Body mass index (BMI) 24.0-24.9, adult: Secondary | ICD-10-CM | POA: Diagnosis not present

## 2022-03-30 DIAGNOSIS — J069 Acute upper respiratory infection, unspecified: Secondary | ICD-10-CM | POA: Diagnosis not present

## 2022-04-04 DIAGNOSIS — J019 Acute sinusitis, unspecified: Secondary | ICD-10-CM | POA: Diagnosis not present

## 2022-04-04 DIAGNOSIS — R030 Elevated blood-pressure reading, without diagnosis of hypertension: Secondary | ICD-10-CM | POA: Diagnosis not present

## 2022-04-04 DIAGNOSIS — Z6824 Body mass index (BMI) 24.0-24.9, adult: Secondary | ICD-10-CM | POA: Diagnosis not present

## 2022-04-10 DIAGNOSIS — M25561 Pain in right knee: Secondary | ICD-10-CM | POA: Diagnosis not present

## 2022-04-10 DIAGNOSIS — J069 Acute upper respiratory infection, unspecified: Secondary | ICD-10-CM | POA: Diagnosis not present

## 2022-04-10 DIAGNOSIS — E782 Mixed hyperlipidemia: Secondary | ICD-10-CM | POA: Diagnosis not present

## 2022-04-11 ENCOUNTER — Ambulatory Visit: Payer: Medicare Other | Attending: Physician Assistant | Admitting: Physician Assistant

## 2022-04-11 ENCOUNTER — Encounter: Payer: Self-pay | Admitting: Physician Assistant

## 2022-04-11 VITALS — BP 135/80 | HR 80 | Resp 15 | Ht 69.0 in | Wt 165.2 lb

## 2022-04-11 DIAGNOSIS — Z8719 Personal history of other diseases of the digestive system: Secondary | ICD-10-CM | POA: Insufficient documentation

## 2022-04-11 DIAGNOSIS — F5101 Primary insomnia: Secondary | ICD-10-CM | POA: Insufficient documentation

## 2022-04-11 DIAGNOSIS — M1A09X1 Idiopathic chronic gout, multiple sites, with tophus (tophi): Secondary | ICD-10-CM | POA: Diagnosis not present

## 2022-04-11 DIAGNOSIS — M25561 Pain in right knee: Secondary | ICD-10-CM | POA: Diagnosis not present

## 2022-04-11 DIAGNOSIS — M25461 Effusion, right knee: Secondary | ICD-10-CM | POA: Insufficient documentation

## 2022-04-11 DIAGNOSIS — G8929 Other chronic pain: Secondary | ICD-10-CM | POA: Diagnosis not present

## 2022-04-11 DIAGNOSIS — M5030 Other cervical disc degeneration, unspecified cervical region: Secondary | ICD-10-CM | POA: Insufficient documentation

## 2022-04-11 DIAGNOSIS — Z79899 Other long term (current) drug therapy: Secondary | ICD-10-CM | POA: Insufficient documentation

## 2022-04-11 DIAGNOSIS — M19072 Primary osteoarthritis, left ankle and foot: Secondary | ICD-10-CM | POA: Insufficient documentation

## 2022-04-11 DIAGNOSIS — M19071 Primary osteoarthritis, right ankle and foot: Secondary | ICD-10-CM | POA: Diagnosis not present

## 2022-04-11 DIAGNOSIS — Z8639 Personal history of other endocrine, nutritional and metabolic disease: Secondary | ICD-10-CM | POA: Diagnosis not present

## 2022-04-11 DIAGNOSIS — Z8679 Personal history of other diseases of the circulatory system: Secondary | ICD-10-CM | POA: Diagnosis not present

## 2022-04-11 NOTE — Patient Instructions (Signed)

## 2022-04-17 ENCOUNTER — Ambulatory Visit (INDEPENDENT_AMBULATORY_CARE_PROVIDER_SITE_OTHER): Payer: Medicare Other

## 2022-04-17 DIAGNOSIS — J309 Allergic rhinitis, unspecified: Secondary | ICD-10-CM | POA: Diagnosis not present

## 2022-04-18 DIAGNOSIS — D0439 Carcinoma in situ of skin of other parts of face: Secondary | ICD-10-CM | POA: Diagnosis not present

## 2022-04-18 DIAGNOSIS — L814 Other melanin hyperpigmentation: Secondary | ICD-10-CM | POA: Diagnosis not present

## 2022-04-18 DIAGNOSIS — D2271 Melanocytic nevi of right lower limb, including hip: Secondary | ICD-10-CM | POA: Diagnosis not present

## 2022-04-18 DIAGNOSIS — L821 Other seborrheic keratosis: Secondary | ICD-10-CM | POA: Diagnosis not present

## 2022-04-18 DIAGNOSIS — Z85828 Personal history of other malignant neoplasm of skin: Secondary | ICD-10-CM | POA: Diagnosis not present

## 2022-04-18 DIAGNOSIS — D485 Neoplasm of uncertain behavior of skin: Secondary | ICD-10-CM | POA: Diagnosis not present

## 2022-04-18 DIAGNOSIS — L570 Actinic keratosis: Secondary | ICD-10-CM | POA: Diagnosis not present

## 2022-04-18 DIAGNOSIS — M25561 Pain in right knee: Secondary | ICD-10-CM | POA: Diagnosis not present

## 2022-04-18 DIAGNOSIS — D225 Melanocytic nevi of trunk: Secondary | ICD-10-CM | POA: Diagnosis not present

## 2022-04-18 DIAGNOSIS — L578 Other skin changes due to chronic exposure to nonionizing radiation: Secondary | ICD-10-CM | POA: Diagnosis not present

## 2022-04-24 ENCOUNTER — Ambulatory Visit (INDEPENDENT_AMBULATORY_CARE_PROVIDER_SITE_OTHER): Payer: Medicare Other

## 2022-04-24 DIAGNOSIS — J309 Allergic rhinitis, unspecified: Secondary | ICD-10-CM

## 2022-05-01 ENCOUNTER — Ambulatory Visit (INDEPENDENT_AMBULATORY_CARE_PROVIDER_SITE_OTHER): Payer: Medicare Other

## 2022-05-01 DIAGNOSIS — J309 Allergic rhinitis, unspecified: Secondary | ICD-10-CM

## 2022-05-06 DIAGNOSIS — M25561 Pain in right knee: Secondary | ICD-10-CM | POA: Diagnosis not present

## 2022-05-07 DIAGNOSIS — M1711 Unilateral primary osteoarthritis, right knee: Secondary | ICD-10-CM | POA: Diagnosis not present

## 2022-05-07 DIAGNOSIS — M1712 Unilateral primary osteoarthritis, left knee: Secondary | ICD-10-CM | POA: Diagnosis not present

## 2022-05-07 DIAGNOSIS — M170 Bilateral primary osteoarthritis of knee: Secondary | ICD-10-CM | POA: Diagnosis not present

## 2022-05-07 DIAGNOSIS — M25561 Pain in right knee: Secondary | ICD-10-CM | POA: Diagnosis not present

## 2022-05-09 DIAGNOSIS — C44329 Squamous cell carcinoma of skin of other parts of face: Secondary | ICD-10-CM | POA: Diagnosis not present

## 2022-05-15 ENCOUNTER — Ambulatory Visit (INDEPENDENT_AMBULATORY_CARE_PROVIDER_SITE_OTHER): Payer: Medicare Other

## 2022-05-15 DIAGNOSIS — J309 Allergic rhinitis, unspecified: Secondary | ICD-10-CM | POA: Diagnosis not present

## 2022-05-20 DIAGNOSIS — M1711 Unilateral primary osteoarthritis, right knee: Secondary | ICD-10-CM | POA: Diagnosis not present

## 2022-05-20 DIAGNOSIS — M25561 Pain in right knee: Secondary | ICD-10-CM | POA: Diagnosis not present

## 2022-05-24 ENCOUNTER — Ambulatory Visit (INDEPENDENT_AMBULATORY_CARE_PROVIDER_SITE_OTHER): Payer: Medicare Other

## 2022-05-24 DIAGNOSIS — J309 Allergic rhinitis, unspecified: Secondary | ICD-10-CM | POA: Diagnosis not present

## 2022-06-05 ENCOUNTER — Ambulatory Visit (INDEPENDENT_AMBULATORY_CARE_PROVIDER_SITE_OTHER): Payer: Medicare Other

## 2022-06-05 DIAGNOSIS — J309 Allergic rhinitis, unspecified: Secondary | ICD-10-CM

## 2022-06-06 DIAGNOSIS — H35379 Puckering of macula, unspecified eye: Secondary | ICD-10-CM | POA: Diagnosis not present

## 2022-06-06 DIAGNOSIS — M1711 Unilateral primary osteoarthritis, right knee: Secondary | ICD-10-CM | POA: Diagnosis not present

## 2022-06-06 DIAGNOSIS — Z8589 Personal history of malignant neoplasm of other organs and systems: Secondary | ICD-10-CM | POA: Diagnosis not present

## 2022-06-06 DIAGNOSIS — Z5189 Encounter for other specified aftercare: Secondary | ICD-10-CM | POA: Diagnosis not present

## 2022-06-06 DIAGNOSIS — H3589 Other specified retinal disorders: Secondary | ICD-10-CM | POA: Diagnosis not present

## 2022-06-06 DIAGNOSIS — H43811 Vitreous degeneration, right eye: Secondary | ICD-10-CM | POA: Diagnosis not present

## 2022-06-06 DIAGNOSIS — H2513 Age-related nuclear cataract, bilateral: Secondary | ICD-10-CM | POA: Diagnosis not present

## 2022-06-11 DIAGNOSIS — H2513 Age-related nuclear cataract, bilateral: Secondary | ICD-10-CM | POA: Diagnosis not present

## 2022-06-11 DIAGNOSIS — H25043 Posterior subcapsular polar age-related cataract, bilateral: Secondary | ICD-10-CM | POA: Diagnosis not present

## 2022-06-11 DIAGNOSIS — H2511 Age-related nuclear cataract, right eye: Secondary | ICD-10-CM | POA: Diagnosis not present

## 2022-06-11 DIAGNOSIS — H18413 Arcus senilis, bilateral: Secondary | ICD-10-CM | POA: Diagnosis not present

## 2022-06-11 DIAGNOSIS — H35371 Puckering of macula, right eye: Secondary | ICD-10-CM | POA: Diagnosis not present

## 2022-06-12 ENCOUNTER — Ambulatory Visit (INDEPENDENT_AMBULATORY_CARE_PROVIDER_SITE_OTHER): Payer: Medicare Other

## 2022-06-12 DIAGNOSIS — J309 Allergic rhinitis, unspecified: Secondary | ICD-10-CM | POA: Diagnosis not present

## 2022-06-13 DIAGNOSIS — M1711 Unilateral primary osteoarthritis, right knee: Secondary | ICD-10-CM | POA: Diagnosis not present

## 2022-06-13 DIAGNOSIS — M25561 Pain in right knee: Secondary | ICD-10-CM | POA: Diagnosis not present

## 2022-06-18 DIAGNOSIS — E790 Hyperuricemia without signs of inflammatory arthritis and tophaceous disease: Secondary | ICD-10-CM | POA: Diagnosis not present

## 2022-06-18 DIAGNOSIS — Z125 Encounter for screening for malignant neoplasm of prostate: Secondary | ICD-10-CM | POA: Diagnosis not present

## 2022-06-18 DIAGNOSIS — R7301 Impaired fasting glucose: Secondary | ICD-10-CM | POA: Diagnosis not present

## 2022-06-18 DIAGNOSIS — E782 Mixed hyperlipidemia: Secondary | ICD-10-CM | POA: Diagnosis not present

## 2022-06-19 ENCOUNTER — Ambulatory Visit (INDEPENDENT_AMBULATORY_CARE_PROVIDER_SITE_OTHER): Payer: Medicare Other

## 2022-06-19 DIAGNOSIS — J309 Allergic rhinitis, unspecified: Secondary | ICD-10-CM | POA: Diagnosis not present

## 2022-06-20 DIAGNOSIS — M1711 Unilateral primary osteoarthritis, right knee: Secondary | ICD-10-CM | POA: Diagnosis not present

## 2022-06-21 DIAGNOSIS — M1A00X1 Idiopathic chronic gout, unspecified site, with tophus (tophi): Secondary | ICD-10-CM | POA: Diagnosis not present

## 2022-06-21 DIAGNOSIS — M179 Osteoarthritis of knee, unspecified: Secondary | ICD-10-CM | POA: Diagnosis not present

## 2022-06-21 DIAGNOSIS — I10 Essential (primary) hypertension: Secondary | ICD-10-CM | POA: Diagnosis not present

## 2022-06-21 DIAGNOSIS — G720 Drug-induced myopathy: Secondary | ICD-10-CM | POA: Diagnosis not present

## 2022-06-21 DIAGNOSIS — I2510 Atherosclerotic heart disease of native coronary artery without angina pectoris: Secondary | ICD-10-CM | POA: Diagnosis not present

## 2022-06-21 DIAGNOSIS — K219 Gastro-esophageal reflux disease without esophagitis: Secondary | ICD-10-CM | POA: Diagnosis not present

## 2022-06-21 DIAGNOSIS — G4700 Insomnia, unspecified: Secondary | ICD-10-CM | POA: Diagnosis not present

## 2022-06-21 DIAGNOSIS — Z0001 Encounter for general adult medical examination with abnormal findings: Secondary | ICD-10-CM | POA: Diagnosis not present

## 2022-06-21 DIAGNOSIS — E875 Hyperkalemia: Secondary | ICD-10-CM | POA: Diagnosis not present

## 2022-06-21 DIAGNOSIS — G248 Other dystonia: Secondary | ICD-10-CM | POA: Diagnosis not present

## 2022-06-21 DIAGNOSIS — M545 Low back pain, unspecified: Secondary | ICD-10-CM | POA: Diagnosis not present

## 2022-06-21 DIAGNOSIS — E782 Mixed hyperlipidemia: Secondary | ICD-10-CM | POA: Diagnosis not present

## 2022-06-24 ENCOUNTER — Telehealth: Payer: Self-pay | Admitting: *Deleted

## 2022-06-24 NOTE — Telephone Encounter (Signed)
Labs received from:Dr. Wende Neighbors  Drawn on:06/18/2022  Reviewed by:Dr. Bo Merino   Labs drawn:Uric Acid, PSA, Hgb A1C, Albumin/Creatinine Ratio, Lipid Panel, CMP, CBC  Results:Uric Acid 7.3  Cholesterol, Total 237  Triglycerides 230  VLDL Cholesterol CAlc 42  LDL Chol Calc 148  BUN 6  BUN/Creat. Ratio 6  RBC 4.11  MCV 99  MCH 33.8

## 2022-06-25 ENCOUNTER — Encounter (INDEPENDENT_AMBULATORY_CARE_PROVIDER_SITE_OTHER): Payer: Self-pay | Admitting: Gastroenterology

## 2022-06-28 ENCOUNTER — Ambulatory Visit (INDEPENDENT_AMBULATORY_CARE_PROVIDER_SITE_OTHER): Payer: Medicare Other

## 2022-06-28 DIAGNOSIS — J309 Allergic rhinitis, unspecified: Secondary | ICD-10-CM

## 2022-07-03 ENCOUNTER — Ambulatory Visit (INDEPENDENT_AMBULATORY_CARE_PROVIDER_SITE_OTHER): Payer: Medicare Other

## 2022-07-03 DIAGNOSIS — J309 Allergic rhinitis, unspecified: Secondary | ICD-10-CM | POA: Diagnosis not present

## 2022-07-10 ENCOUNTER — Ambulatory Visit (INDEPENDENT_AMBULATORY_CARE_PROVIDER_SITE_OTHER): Payer: Medicare Other

## 2022-07-10 DIAGNOSIS — J309 Allergic rhinitis, unspecified: Secondary | ICD-10-CM | POA: Diagnosis not present

## 2022-07-16 ENCOUNTER — Ambulatory Visit (INDEPENDENT_AMBULATORY_CARE_PROVIDER_SITE_OTHER): Payer: Medicare Other | Admitting: Gastroenterology

## 2022-07-16 ENCOUNTER — Encounter (INDEPENDENT_AMBULATORY_CARE_PROVIDER_SITE_OTHER): Payer: Self-pay | Admitting: Gastroenterology

## 2022-07-17 ENCOUNTER — Ambulatory Visit (INDEPENDENT_AMBULATORY_CARE_PROVIDER_SITE_OTHER): Payer: Medicare Other

## 2022-07-17 DIAGNOSIS — J309 Allergic rhinitis, unspecified: Secondary | ICD-10-CM | POA: Diagnosis not present

## 2022-07-18 ENCOUNTER — Ambulatory Visit (INDEPENDENT_AMBULATORY_CARE_PROVIDER_SITE_OTHER): Payer: Medicare Other | Admitting: Gastroenterology

## 2022-07-19 DIAGNOSIS — H35371 Puckering of macula, right eye: Secondary | ICD-10-CM | POA: Diagnosis not present

## 2022-07-19 DIAGNOSIS — H33311 Horseshoe tear of retina without detachment, right eye: Secondary | ICD-10-CM | POA: Diagnosis not present

## 2022-07-19 DIAGNOSIS — H2511 Age-related nuclear cataract, right eye: Secondary | ICD-10-CM | POA: Diagnosis not present

## 2022-07-22 DIAGNOSIS — J3081 Allergic rhinitis due to animal (cat) (dog) hair and dander: Secondary | ICD-10-CM | POA: Diagnosis not present

## 2022-07-22 NOTE — Progress Notes (Signed)
VIALS EXP 07-23-23

## 2022-07-23 DIAGNOSIS — J302 Other seasonal allergic rhinitis: Secondary | ICD-10-CM | POA: Diagnosis not present

## 2022-07-24 ENCOUNTER — Ambulatory Visit (INDEPENDENT_AMBULATORY_CARE_PROVIDER_SITE_OTHER): Payer: Medicare Other

## 2022-07-24 DIAGNOSIS — J309 Allergic rhinitis, unspecified: Secondary | ICD-10-CM | POA: Diagnosis not present

## 2022-07-26 DIAGNOSIS — H2511 Age-related nuclear cataract, right eye: Secondary | ICD-10-CM | POA: Diagnosis not present

## 2022-07-26 DIAGNOSIS — Z9889 Other specified postprocedural states: Secondary | ICD-10-CM | POA: Diagnosis not present

## 2022-07-26 DIAGNOSIS — H35371 Puckering of macula, right eye: Secondary | ICD-10-CM | POA: Diagnosis not present

## 2022-07-30 ENCOUNTER — Telehealth: Payer: Self-pay | Admitting: *Deleted

## 2022-07-30 DIAGNOSIS — D485 Neoplasm of uncertain behavior of skin: Secondary | ICD-10-CM | POA: Diagnosis not present

## 2022-07-30 DIAGNOSIS — L570 Actinic keratosis: Secondary | ICD-10-CM | POA: Diagnosis not present

## 2022-07-30 NOTE — Telephone Encounter (Signed)
Labs received from PCP-per Dr. Gardiner Rhyme recommend pharmD lipid appointment as patient unable to tolerate statins.   Left message to call back

## 2022-07-31 DIAGNOSIS — M1711 Unilateral primary osteoarthritis, right knee: Secondary | ICD-10-CM | POA: Diagnosis not present

## 2022-08-07 ENCOUNTER — Ambulatory Visit (INDEPENDENT_AMBULATORY_CARE_PROVIDER_SITE_OTHER): Payer: Medicare Other

## 2022-08-07 DIAGNOSIS — J309 Allergic rhinitis, unspecified: Secondary | ICD-10-CM | POA: Diagnosis not present

## 2022-08-14 ENCOUNTER — Ambulatory Visit (INDEPENDENT_AMBULATORY_CARE_PROVIDER_SITE_OTHER): Payer: Medicare Other

## 2022-08-14 DIAGNOSIS — J309 Allergic rhinitis, unspecified: Secondary | ICD-10-CM

## 2022-08-15 ENCOUNTER — Telehealth (INDEPENDENT_AMBULATORY_CARE_PROVIDER_SITE_OTHER): Payer: Self-pay | Admitting: Gastroenterology

## 2022-08-15 ENCOUNTER — Encounter (INDEPENDENT_AMBULATORY_CARE_PROVIDER_SITE_OTHER): Payer: Self-pay | Admitting: Gastroenterology

## 2022-08-15 ENCOUNTER — Ambulatory Visit (INDEPENDENT_AMBULATORY_CARE_PROVIDER_SITE_OTHER): Payer: Medicare Other | Admitting: Gastroenterology

## 2022-08-15 VITALS — BP 133/86 | HR 80 | Temp 97.5°F | Ht 69.0 in | Wt 166.9 lb

## 2022-08-15 DIAGNOSIS — R1319 Other dysphagia: Secondary | ICD-10-CM

## 2022-08-15 DIAGNOSIS — K200 Eosinophilic esophagitis: Secondary | ICD-10-CM | POA: Diagnosis not present

## 2022-08-15 DIAGNOSIS — Z860100 Personal history of colon polyps, unspecified: Secondary | ICD-10-CM

## 2022-08-15 DIAGNOSIS — K2100 Gastro-esophageal reflux disease with esophagitis, without bleeding: Secondary | ICD-10-CM

## 2022-08-15 NOTE — Telephone Encounter (Signed)
Contact pt to scheduled EGD/TCS (ASA 1). Pt states he will need to get with his daughter to see when she could be with him. Pt will call back to schedule.

## 2022-08-15 NOTE — Addendum Note (Signed)
Addended by: Harvel Quale on: 08/15/2022 11:19 AM   Modules accepted: Level of Service

## 2022-08-15 NOTE — Progress Notes (Signed)
Dennis Miles, M.D. Gastroenterology & Hepatology [facility] [address] [place], [place] [address]  Primary Care Physician: Celene Squibb, MD [address] [address]  I will communicate my assessment and recommendations to the referring MD via EMR.  Problems: GERD Eosinophilic esophagitis  History of Present Illness: Dennis Miles is a 72 y.o. male with PMH GERD, EoE,  anxiety, HTN, gout, skin cancer, who presents for follow up of EoE and GERD.  The patient was last seen on 07/17/2021. At that time, the patient was advised to continue with pantoprazole 40 mg every other day for GERD.  Notably, the patient was treated in the past with fluticasone for 6 weeks with excellent response as his dysphagia improved for a few months.  The patient states that he has presented recurrent episodes of pain in his chest. He has presented recurrent discomfort in his chest for the last week, which is present for most of the day, but denies any heartburn. He has felt that he has presented some dysphagia with pills, but no dysphagia to solids or liquids. He also reports that he has has presented some discomfort in his epigastric area, which is not very bothersome. He is currently taking pantoprazole 40 mg every other day as advised by Dr. Laural Golden.  The patient denies having any nausea, vomiting, fever, chills, hematochezia, melena, hematemesis, abdominal distention, abdominal pain, diarrhea, jaundice, pruritus or weight loss.  He has a history of significant seasonal allergies, for which he has been on injection therapy. Takes Xyzal every day.  Last EGD: 05/2021 Presence of longitudinal force and circumferential folds in the mid esophagus and distal esophagus (biopsies from esophagus showed more than 30-40 and sooner feels), stenosis was found at 38 cm which was dilated up to 16.5 mm with moderate mucosal disruption, 2 cm hiatal hernia,  gastric polyps (has a fundic gland polyp), gastritis (normal gastric mucosa negative for H. pylori), normal duodenum.  Last Colonoscopy: May 2014 with removal of 3 mm tubular adenoma. Diverticulosis.  Past Medical History: Past Medical History:  Diagnosis Date   Anxiety    GERD (gastroesophageal reflux disease)    Gout    Headache(784.0)    Hiatal hernia    High cholesterol    Hypertension    Skin cancer     Past Surgical History: Past Surgical History:  Procedure Laterality Date   APPENDECTOMY     BIOPSY  05/30/2021   Procedure: BIOPSY;  Surgeon: Rogene Houston, MD;  [facility];  Service: Endoscopy;;   COLONOSCOPY N/A 10/23/2012   Procedure: COLONOSCOPY;  Surgeon: Rogene Houston, MD;  [facility];  Service: Endoscopy;  Laterality: N/A;  235   ESOPHAGEAL DILATION N/A 05/30/2021   Procedure: ESOPHAGEAL DILATION;  Surgeon: Rogene Houston, MD;  [facility];  Service: Endoscopy;  Laterality: N/A;   ESOPHAGOGASTRODUODENOSCOPY  04/17/2011   Procedure: ESOPHAGOGASTRODUODENOSCOPY (EGD);  Surgeon: Rogene Houston, MD;  [facility];  Service: Endoscopy;  Laterality: N/A;  8:30   ESOPHAGOGASTRODUODENOSCOPY (EGD) WITH PROPOFOL N/A 05/30/2021   Procedure: ESOPHAGOGASTRODUODENOSCOPY (EGD) WITH PROPOFOL;  Surgeon: Rogene Houston, MD;  [facility];  Service: Endoscopy;  Laterality: N/A;  7:30   HAND SURGERY  05/28/2016   INGUINAL HERNIA REPAIR Left 02/17/2013   Procedure: HERNIA REPAIR INGUINAL ADULT;  Surgeon: Jamesetta So, MD;  [facility]: [facility];  Service: General;  Laterality: Left;   INSERTION OF  MESH Left 02/17/2013   Procedure: INSERTION OF MESH;  Surgeon: Jamesetta So, MD;  [facility]: [facility];  Service: General;  Laterality: Left;   KNEE ARTHROSCOPY W/ MENISCAL REPAIR Right 11/2021   SINOSCOPY     sinus sugery     SKIN CANCER EXCISION     x4   SQUAMOUS CELL CARCINOMA EXCISION Left 2023   forearm    Family  History: Family History  Problem Relation Age of Onset   Aneurysm Father    Asthma Child    Colon cancer Neg Hx     Social History: Social History   Tobacco Use  Smoking Status Never   Passive exposure: Never  Smokeless Tobacco Former   Types: Chew   Quit date: 06/03/2012   Social History   Substance and Sexual Activity  Alcohol Use Yes   Alcohol/week: 1.0 standard drink of alcohol   Types: 1 Cans of beer per week   Comment: Social   Social History   Substance and Sexual Activity  Drug Use Never    Allergies: Allergies  Allergen Reactions   Codeine Nausea Only   Crestor [Rosuvastatin] Other (See Comments)    myalgias   Lipitor [Atorvastatin] Other (See Comments)    Myalgias    Zetia [Ezetimibe] Other (See Comments)    myalgias    Medications: Current Outpatient Medications  Medication Sig Dispense Refill   allopurinol (ZYLOPRIM) 100 MG tablet Take 2 tablets (200 mg total) by mouth daily. 180 tablet 0   cyclobenzaprine (FLEXERIL) 5 MG tablet Take 5 mg by mouth daily as needed for muscle spasms (pinched nerve).     EPINEPHRINE 0.3 mg/0.3 mL IJ SOAJ injection Inject 0.3 mg into the muscle as needed for anaphylaxis. 2 each 1   ezetimibe (ZETIA) 10 MG tablet Take 10 mg by mouth daily.     fluticasone (FLONASE) 50 MCG/ACT nasal spray Place 2 sprays into both nostrils daily as needed for allergies. 16 g 5   levocetirizine (XYZAL) 5 MG tablet Take 1 tablet (5 mg total) by mouth daily as needed for allergies. 60 tablet 5   lisinopril (PRINIVIL,ZESTRIL) 10 MG tablet Take 10 mg by mouth daily.     pantoprazole (PROTONIX) 40 MG tablet Take 1 tablet (40 mg total) by mouth daily before breakfast. 90 tablet 3   PRESCRIPTION MEDICATION Allergy injections at the allergist office     zolpidem (AMBIEN) 10 MG tablet Take 10 mg by mouth at bedtime.     No current facility-administered medications for this visit.    Review of Systems: GENERAL: negative for malaise, night  sweats HEENT: No changes in hearing or vision, no nose bleeds or other nasal problems. NECK: Negative for lumps, goiter, pain and significant neck swelling RESPIRATORY: Negative for cough, wheezing CARDIOVASCULAR: Negative for chest pain, leg swelling, palpitations, orthopnea GI: SEE HPI MUSCULOSKELETAL: Negative for joint pain or swelling, back pain, and muscle pain. SKIN: Negative for lesions, rash PSYCH: Negative for sleep disturbance, mood disorder and recent psychosocial stressors. HEMATOLOGY Negative for prolonged bleeding, bruising easily, and swollen nodes. ENDOCRINE: Negative for cold or heat intolerance, polyuria, polydipsia and goiter. NEURO: negative for tremor, gait imbalance, syncope and seizures. The remainder of the review of systems is noncontributory.   Physical Exam: BP 133/86 (BP Location: Left Arm, Patient Position: Sitting, Cuff Size: Normal)   Pulse 80   Temp (!) 97.5 F (36.4 C) (Temporal)   Ht '5\' 9"'$  (1.753 m)   Wt 166 lb 14.4 oz (75.7 kg)  BMI 24.65 kg/m  GENERAL: The patient is AO x3, in no acute distress. HEENT: Head is normocephalic and atraumatic. EOMI are intact. Mouth is well hydrated and without lesions. NECK: Supple. No masses LUNGS: Clear to auscultation. No presence of rhonchi/wheezing/rales. Adequate chest expansion HEART: RRR, normal s1 and s2. ABDOMEN: Soft, nontender, no guarding, no peritoneal signs, and nondistended. BS +. No masses. EXTREMITIES: Without any cyanosis, clubbing, rash, lesions or edema. NEUROLOGIC: AOx3, no focal motor deficit. SKIN: no jaundice, no rashes  Imaging/Labs: as above  I personally reviewed and interpreted the available labs, imaging and endoscopic files.  Impression and Plan: Dennis Miles is a 72 y.o. male with PMH GERD, EoE,  anxiety, HTN, gout, skin cancer, who presents for follow up of EoE and GERD.  The patient has presented recurrent episodes of chest discomfort and questionable reflux symptoms, with  dysphagia to pills.  We discussed in detail that his symptoms could be related to a combination of GERD and EOE, for which he is on suboptimal doses of PPI.  We discussed in detail the pathophysiology, prognosis and possible treatment options for EOE which include high-dose PPI twice daily, dietary changes or Dupixent.  Unfortunately, his previous biopsies were taken in 1 jar and ideally he should have biopsies from his mid and distal esophagus separately to confirm a diagnosis of EOE.  Due to this, we will proceed with an EGD with possible dilation given his pill dysphagia.  If the changes are consistent with EOE, we will try a PPI twice daily on a regular basis.  Patient understood and agreed.  He is also due for colorectal cancer screening with a colonoscopy given his history of colon polyps, which will be scheduled today as well.  - Continue pantoprazole 40 mg every other day for now, may increase the dosage depending on endoscopic findings - Schedule EGD and colonoscopy  All questions were answered.      Dennis Peppers, MD Gastroenterology and Hepatology [facility]

## 2022-08-15 NOTE — H&P (View-Only) (Signed)
Dennis Miles, M.D. Gastroenterology & Hepatology [facility] [address] [place], [place] [address]  Primary Care Physician: Hall, John Z, MD [address] [place]  [id number]  I will communicate my assessment and recommendations to the referring MD via EMR.  Problems: GERD Eosinophilic esophagitis  History of Present Illness: Dennis Miles is a 71 y.o. male with PMH GERD, EoE,  anxiety, HTN, gout, skin cancer, who presents for follow up of EoE and GERD.  The patient was last seen on 07/17/2021. At that time, the patient was advised to continue with pantoprazole 40 mg every other day for GERD.  Notably, the patient was treated in the past with fluticasone for 6 weeks with excellent response as his dysphagia improved for a few months.  The patient states that he has presented recurrent episodes of pain in his chest. He has presented recurrent discomfort in his chest for the last week, which is present for most of the day, but denies any heartburn. He has felt that he has presented some dysphagia with pills, but no dysphagia to solids or liquids. He also reports that he has has presented some discomfort in his epigastric area, which is not very bothersome. He is currently taking pantoprazole 40 mg every other day as advised by Dr. Rehman.  The patient denies having any nausea, vomiting, fever, chills, hematochezia, melena, hematemesis, abdominal distention, abdominal pain, diarrhea, jaundice, pruritus or weight loss.  He has a history of significant seasonal allergies, for which he has been on injection therapy. Takes Xyzal every day.  Last EGD: 05/2021 Presence of longitudinal force and circumferential folds in the mid esophagus and distal esophagus (biopsies from esophagus showed more than 30-40 and sooner feels), stenosis was found at 38 cm which was dilated up to 16.5 mm with moderate mucosal disruption, 2 cm hiatal hernia,  gastric polyps (has a fundic gland polyp), gastritis (normal gastric mucosa negative for H. pylori), normal duodenum.  Last Colonoscopy: May 2014 with removal of 3 mm tubular adenoma. Diverticulosis.  Past Medical History: Past Medical History:  Diagnosis Date   Anxiety    GERD (gastroesophageal reflux disease)    Gout    Headache(784.0)    Hiatal hernia    High cholesterol    Hypertension    Skin cancer     Past Surgical History: Past Surgical History:  Procedure Laterality Date   APPENDECTOMY     BIOPSY  05/30/2021   Procedure: BIOPSY;  Surgeon: Miles, Dennis U, MD;  [facility];  Service: Endoscopy;;   COLONOSCOPY N/A 10/23/2012   Procedure: COLONOSCOPY;  Surgeon: Dennis Miles Rehman, MD;  [facility];  Service: Endoscopy;  Laterality: N/A;  235   ESOPHAGEAL DILATION N/A 05/30/2021   Procedure: ESOPHAGEAL DILATION;  Surgeon: Miles, Dennis U, MD;  [facility];  Service: Endoscopy;  Laterality: N/A;   ESOPHAGOGASTRODUODENOSCOPY  04/17/2011   Procedure: ESOPHAGOGASTRODUODENOSCOPY (EGD);  Surgeon: Dennis Miles Rehman, MD;  [facility];  Service: Endoscopy;  Laterality: N/A;  8:30   ESOPHAGOGASTRODUODENOSCOPY (EGD) WITH PROPOFOL N/A 05/30/2021   Procedure: ESOPHAGOGASTRODUODENOSCOPY (EGD) WITH PROPOFOL;  Surgeon: Miles, Dennis U, MD;  [facility];  Service: Endoscopy;  Laterality: N/A;  7:30   HAND SURGERY  05/28/2016   INGUINAL HERNIA REPAIR Left 02/17/2013   Procedure: HERNIA REPAIR INGUINAL ADULT;  Surgeon: Dennis A Jenkins, MD;  [facility];  Service: General;  Laterality: Left;   INSERTION OF   MESH Left 02/17/2013   Procedure: INSERTION OF MESH;  Surgeon: Dennis A Jenkins, MD;  [facility];  Service: General;  Laterality: Left;   KNEE ARTHROSCOPY W/ MENISCAL REPAIR Right 11/2021   SINOSCOPY     sinus sugery     SKIN CANCER EXCISION     x4   SQUAMOUS CELL CARCINOMA EXCISION Left 2023   forearm    Family  History: Family History  Problem Relation Age of Onset   Aneurysm Father    Asthma Child    Colon cancer Neg Hx     Social History: Social History   Tobacco Use  Smoking Status Never   Passive exposure: Never  Smokeless Tobacco Former   Types: Chew   Quit date: 06/03/2012   Social History   Substance and Sexual Activity  Alcohol Use Yes   Alcohol/week: 1.0 standard drink of alcohol   Types: 1 Cans of beer per week   Comment: Social   Social History   Substance and Sexual Activity  Drug Use Never    Allergies: Allergies  Allergen Reactions   Codeine Nausea Only   Crestor [Rosuvastatin] Other (See Comments)    myalgias   Lipitor [Atorvastatin] Other (See Comments)    Myalgias    Zetia [Ezetimibe] Other (See Comments)    myalgias    Medications: Current Outpatient Medications  Medication Sig Dispense Refill   allopurinol (ZYLOPRIM) 100 MG tablet Take 2 tablets (200 mg total) by mouth daily. 180 tablet 0   cyclobenzaprine (FLEXERIL) 5 MG tablet Take 5 mg by mouth daily as needed for muscle spasms (pinched nerve).     EPINEPHRINE 0.3 mg/0.3 mL IJ SOAJ injection Inject 0.3 mg into the muscle as needed for anaphylaxis. 2 each 1   ezetimibe (ZETIA) 10 MG tablet Take 10 mg by mouth daily.     fluticasone (FLONASE) 50 MCG/ACT nasal spray Place 2 sprays into both nostrils daily as needed for allergies. 16 g 5   levocetirizine (XYZAL) 5 MG tablet Take 1 tablet (5 mg total) by mouth daily as needed for allergies. 60 tablet 5   lisinopril (PRINIVIL,ZESTRIL) 10 MG tablet Take 10 mg by mouth daily.     pantoprazole (PROTONIX) 40 MG tablet Take 1 tablet (40 mg total) by mouth daily before breakfast. 90 tablet 3   PRESCRIPTION MEDICATION Allergy injections at the allergist office     zolpidem (AMBIEN) 10 MG tablet Take 10 mg by mouth at bedtime.     No current facility-administered medications for this visit.    Review of Systems: GENERAL: negative for malaise, night  sweats HEENT: No changes in hearing or vision, no nose bleeds or other nasal problems. NECK: Negative for lumps, goiter, pain and significant neck swelling RESPIRATORY: Negative for cough, wheezing CARDIOVASCULAR: Negative for chest pain, leg swelling, palpitations, orthopnea GI: SEE HPI MUSCULOSKELETAL: Negative for joint pain or swelling, back pain, and muscle pain. SKIN: Negative for lesions, rash PSYCH: Negative for sleep disturbance, mood disorder and recent psychosocial stressors. HEMATOLOGY Negative for prolonged bleeding, bruising easily, and swollen nodes. ENDOCRINE: Negative for cold or heat intolerance, polyuria, polydipsia and goiter. NEURO: negative for tremor, gait imbalance, syncope and seizures. The remainder of the review of systems is noncontributory.   Physical Exam: BP 133/86 (BP Location: Left Arm, Patient Position: Sitting, Cuff Size: Normal)   Pulse 80   Temp (!) 97.5 F (36.4 C) (Temporal)   Ht 5' 9" (1.753 m)   Wt 166 lb 14.4 oz (75.7 kg)     BMI 24.65 kg/m  GENERAL: The patient is AO x3, in no acute distress. HEENT: Head is normocephalic and atraumatic. EOMI are intact. Mouth is well hydrated and without lesions. NECK: Supple. No masses LUNGS: Clear to auscultation. No presence of rhonchi/wheezing/rales. Adequate chest expansion HEART: RRR, normal s1 and s2. ABDOMEN: Soft, nontender, no guarding, no peritoneal signs, and nondistended. BS +. No masses. EXTREMITIES: Without any cyanosis, clubbing, rash, lesions or edema. NEUROLOGIC: AOx3, no focal motor deficit. SKIN: no jaundice, no rashes  Imaging/Labs: as above  I personally reviewed and interpreted the available labs, imaging and endoscopic files.  Impression and Plan: Dennis Miles is a 71 y.o. male with PMH GERD, EoE,  anxiety, HTN, gout, skin cancer, who presents for follow up of EoE and GERD.  The patient has presented recurrent episodes of chest discomfort and questionable reflux symptoms, with  dysphagia to pills.  We discussed in detail that his symptoms could be related to a combination of GERD and EOE, for which he is on suboptimal doses of PPI.  We discussed in detail the pathophysiology, prognosis and possible treatment options for EOE which include high-dose PPI twice daily, dietary changes or Dupixent.  Unfortunately, his previous biopsies were taken in 1 jar and ideally he should have biopsies from his mid and distal esophagus separately to confirm a diagnosis of EOE.  Due to this, we will proceed with an EGD with possible dilation given his pill dysphagia.  If the changes are consistent with EOE, we will try a PPI twice daily on a regular basis.  Patient understood and agreed.  He is also due for colorectal cancer screening with a colonoscopy given his history of colon polyps, which will be scheduled today as well.  - Continue pantoprazole 40 mg every other day for now, may increase the dosage depending on endoscopic findings - Schedule EGD and colonoscopy  All questions were answered.      Loyalty Arentz Castaneda, MD Gastroenterology and Hepatology [facility]  

## 2022-08-15 NOTE — Patient Instructions (Signed)
Continue pantoprazole 40 mg every other day for now, may increase the dosage depending on endoscopic findings Schedule EGD and colonoscopy

## 2022-08-19 MED ORDER — PEG 3350-KCL-NA BICARB-NACL 420 G PO SOLR: 4000.0000 mL | Freq: Once | ORAL | 0 refills | Status: AC

## 2022-08-19 NOTE — Telephone Encounter (Signed)
Pt contacted and TCS/EGD scheduled for 09/12/22. Instructions mailed to patient; prep sent to pharmacy.

## 2022-08-19 NOTE — Telephone Encounter (Signed)
Pt returned call and left voicemail and states 09/12/22 would work for him.  Contacted pt but had to leave voicemail.

## 2022-08-19 NOTE — Addendum Note (Signed)
Addended by: Vicente Males on: 08/19/2022 12:09 PM   Modules accepted: Orders

## 2022-08-20 NOTE — Telephone Encounter (Signed)
Left message to call back  

## 2022-08-21 ENCOUNTER — Ambulatory Visit (INDEPENDENT_AMBULATORY_CARE_PROVIDER_SITE_OTHER): Payer: Medicare Other

## 2022-08-21 DIAGNOSIS — J309 Allergic rhinitis, unspecified: Secondary | ICD-10-CM

## 2022-08-23 DIAGNOSIS — H35371 Puckering of macula, right eye: Secondary | ICD-10-CM | POA: Diagnosis not present

## 2022-08-23 DIAGNOSIS — Z9889 Other specified postprocedural states: Secondary | ICD-10-CM | POA: Diagnosis not present

## 2022-08-23 DIAGNOSIS — H2510 Age-related nuclear cataract, unspecified eye: Secondary | ICD-10-CM | POA: Diagnosis not present

## 2022-08-23 DIAGNOSIS — H2511 Age-related nuclear cataract, right eye: Secondary | ICD-10-CM | POA: Diagnosis not present

## 2022-08-26 NOTE — Telephone Encounter (Signed)
Left message to call back  

## 2022-08-28 ENCOUNTER — Ambulatory Visit (INDEPENDENT_AMBULATORY_CARE_PROVIDER_SITE_OTHER): Payer: Medicare Other

## 2022-08-28 DIAGNOSIS — J309 Allergic rhinitis, unspecified: Secondary | ICD-10-CM

## 2022-09-04 ENCOUNTER — Ambulatory Visit (INDEPENDENT_AMBULATORY_CARE_PROVIDER_SITE_OTHER): Payer: Medicare Other

## 2022-09-04 DIAGNOSIS — J309 Allergic rhinitis, unspecified: Secondary | ICD-10-CM

## 2022-09-11 ENCOUNTER — Ambulatory Visit (INDEPENDENT_AMBULATORY_CARE_PROVIDER_SITE_OTHER): Payer: Medicare Other

## 2022-09-11 DIAGNOSIS — J309 Allergic rhinitis, unspecified: Secondary | ICD-10-CM | POA: Diagnosis not present

## 2022-09-12 ENCOUNTER — Encounter (INDEPENDENT_AMBULATORY_CARE_PROVIDER_SITE_OTHER): Payer: Self-pay | Admitting: *Deleted

## 2022-09-12 ENCOUNTER — Ambulatory Visit (HOSPITAL_COMMUNITY)
Admission: RE | Admit: 2022-09-12 | Discharge: 2022-09-12 | Disposition: A | Discharge status: Home / Self Care | Payer: Medicare Other | Attending: Gastroenterology | Admitting: Gastroenterology

## 2022-09-12 ENCOUNTER — Ambulatory Visit (HOSPITAL_BASED_OUTPATIENT_CLINIC_OR_DEPARTMENT_OTHER): Payer: Medicare Other | Admitting: Anesthesiology

## 2022-09-12 ENCOUNTER — Ambulatory Visit (HOSPITAL_COMMUNITY): Payer: Medicare Other | Admitting: Anesthesiology

## 2022-09-12 ENCOUNTER — Encounter (HOSPITAL_COMMUNITY): Payer: Self-pay | Admitting: Gastroenterology

## 2022-09-12 ENCOUNTER — Encounter (HOSPITAL_COMMUNITY)
Admission: RE | Disposition: A | Discharge status: Home / Self Care | Payer: Self-pay | Source: Home / Self Care | Attending: Gastroenterology

## 2022-09-12 ENCOUNTER — Other Ambulatory Visit: Payer: Self-pay

## 2022-09-12 DIAGNOSIS — R1310 Dysphagia, unspecified: Secondary | ICD-10-CM | POA: Diagnosis not present

## 2022-09-12 DIAGNOSIS — Z86010 Personal history of colonic polyps: Secondary | ICD-10-CM | POA: Diagnosis not present

## 2022-09-12 DIAGNOSIS — M109 Gout, unspecified: Secondary | ICD-10-CM | POA: Diagnosis not present

## 2022-09-12 DIAGNOSIS — K649 Unspecified hemorrhoids: Secondary | ICD-10-CM | POA: Diagnosis not present

## 2022-09-12 DIAGNOSIS — K200 Eosinophilic esophagitis: Secondary | ICD-10-CM | POA: Insufficient documentation

## 2022-09-12 DIAGNOSIS — Z85828 Personal history of other malignant neoplasm of skin: Secondary | ICD-10-CM | POA: Diagnosis not present

## 2022-09-12 DIAGNOSIS — E7800 Pure hypercholesterolemia, unspecified: Secondary | ICD-10-CM | POA: Insufficient documentation

## 2022-09-12 DIAGNOSIS — K2289 Other specified disease of esophagus: Secondary | ICD-10-CM | POA: Diagnosis not present

## 2022-09-12 DIAGNOSIS — K222 Esophageal obstruction: Secondary | ICD-10-CM

## 2022-09-12 DIAGNOSIS — K449 Diaphragmatic hernia without obstruction or gangrene: Secondary | ICD-10-CM | POA: Insufficient documentation

## 2022-09-12 DIAGNOSIS — Z1211 Encounter for screening for malignant neoplasm of colon: Secondary | ICD-10-CM | POA: Diagnosis not present

## 2022-09-12 DIAGNOSIS — D124 Benign neoplasm of descending colon: Secondary | ICD-10-CM | POA: Diagnosis not present

## 2022-09-12 DIAGNOSIS — F419 Anxiety disorder, unspecified: Secondary | ICD-10-CM | POA: Insufficient documentation

## 2022-09-12 DIAGNOSIS — K635 Polyp of colon: Secondary | ICD-10-CM

## 2022-09-12 DIAGNOSIS — K219 Gastro-esophageal reflux disease without esophagitis: Secondary | ICD-10-CM | POA: Diagnosis not present

## 2022-09-12 DIAGNOSIS — R0789 Other chest pain: Secondary | ICD-10-CM | POA: Insufficient documentation

## 2022-09-12 DIAGNOSIS — K648 Other hemorrhoids: Secondary | ICD-10-CM | POA: Insufficient documentation

## 2022-09-12 DIAGNOSIS — I10 Essential (primary) hypertension: Secondary | ICD-10-CM | POA: Insufficient documentation

## 2022-09-12 HISTORY — PX: POLYPECTOMY: SHX149

## 2022-09-12 HISTORY — PX: BIOPSY: SHX5522

## 2022-09-12 HISTORY — PX: ESOPHAGOGASTRODUODENOSCOPY (EGD) WITH PROPOFOL: SHX5813

## 2022-09-12 HISTORY — PX: COLONOSCOPY WITH PROPOFOL: SHX5780

## 2022-09-12 HISTORY — PX: BALLOON DILATION: SHX5330

## 2022-09-12 LAB — HM COLONOSCOPY

## 2022-09-12 SURGERY — COLONOSCOPY WITH PROPOFOL
Anesthesia: General

## 2022-09-12 MED ORDER — PROPOFOL 500 MG/50ML IV EMUL
INTRAVENOUS | Status: DC | PRN
  Administered 2022-09-12: 150 ug/kg/min via INTRAVENOUS

## 2022-09-12 MED ORDER — LIDOCAINE HCL (CARDIAC) PF 100 MG/5ML IV SOSY
PREFILLED_SYRINGE | INTRAVENOUS | Status: DC | PRN
  Administered 2022-09-12: 50 mg via INTRATRACHEAL

## 2022-09-12 MED ORDER — PROPOFOL 10 MG/ML IV BOLUS
INTRAVENOUS | Status: DC | PRN
  Administered 2022-09-12: 100 mg via INTRAVENOUS
  Administered 2022-09-12 (×4): 50 mg via INTRAVENOUS

## 2022-09-12 MED ORDER — PROPOFOL 500 MG/50ML IV EMUL
INTRAVENOUS | Status: AC
  Filled 2022-09-12: qty 50

## 2022-09-12 MED ORDER — LACTATED RINGERS IV SOLN: INTRAVENOUS | Status: DC

## 2022-09-12 MED ORDER — PANTOPRAZOLE SODIUM 40 MG PO TBEC: 40.0000 mg | DELAYED_RELEASE_TABLET | Freq: Two times a day (BID) | ORAL | 1 refills | Status: DC

## 2022-09-12 MED ORDER — LIDOCAINE HCL (PF) 2 % IJ SOLN
INTRAMUSCULAR | Status: AC
  Filled 2022-09-12: qty 5

## 2022-09-12 MED ORDER — PHENYLEPHRINE 80 MCG/ML (10ML) SYRINGE FOR IV PUSH (FOR BLOOD PRESSURE SUPPORT)
PREFILLED_SYRINGE | INTRAVENOUS | Status: AC
  Filled 2022-09-12: qty 10

## 2022-09-12 NOTE — Discharge Instructions (Addendum)
You are being discharged to home.  Resume your previous diet.  Take Protonix (pantoprazole) 40 mg by mouth twice a day.  We are waiting for your pathology results.   Your physician has recommended a repeat colonoscopy for surveillance based on pathology results.

## 2022-09-12 NOTE — Transfer of Care (Signed)
Immediate Anesthesia Transfer of Care Note  Patient: Dennis Miles  Procedure(s) Performed: COLONOSCOPY WITH PROPOFOL ESOPHAGOGASTRODUODENOSCOPY (EGD) WITH PROPOFOL BALLOON DILATION BIOPSY POLYPECTOMY INTESTINAL  Patient Location: PACU  Anesthesia Type:MAC  Level of Consciousness: awake and patient cooperative  Airway & Oxygen Therapy: Patient Spontanous Breathing  Post-op Assessment: Report given to RN and Post -op Vital signs reviewed and stable  Post vital signs: Reviewed and stable  Last Vitals:  Vitals Value Taken Time  BP    Temp    Pulse    Resp    SpO2      Last Pain:  Vitals:   09/12/22 1132  TempSrc:   PainSc: 0-No pain      Patients Stated Pain Goal: 6 (09/12/22 1119)  Complications: No notable events documented.

## 2022-09-12 NOTE — Op Note (Signed)
[facility] Patient Name: Dennis Miles Procedure Date: 09/12/2022 11:47 AM MRN: [id number] Date of Birth: November 02, 1950 Attending MD: Katrinka Blazing , , [id number] CSN: [id number] Age: 72 Admit Type: Outpatient Procedure:                Colonoscopy Indications:              Surveillance: Personal history of adenomatous                            polyps on last colonoscopy > 5 years ago Providers:                Katrinka Blazing, Crystal Page, Darci Needle, Technician Referring MD:              Medicines:                Monitored Anesthesia Care Complications:            No immediate complications. Estimated Blood Loss:     Estimated blood loss: none. Procedure:                Pre-Anesthesia Assessment:                           - Prior to the procedure, a History and Physical                            was performed, and patient medications, allergies                            and sensitivities were reviewed. The patient's                            tolerance of previous anesthesia was reviewed.                           - The risks and benefits of the procedure and the                            sedation options and risks were discussed with the                            patient. All questions were answered and informed                            consent was obtained.                           - ASA Grade Assessment: II - A patient with mild                            systemic disease.                           After obtaining informed consent, the colonoscope  was passed under direct vision. Throughout the                            procedure, the patient's blood pressure, pulse, and                            oxygen saturations were monitored continuously. The                            [id number]-[id number] ([id number]) scope was introduced through                            the anus and advanced to the the cecum, identified                             by appendiceal orifice and ileocecal valve. The                            colonoscopy was performed without difficulty. The                            patient tolerated the procedure well. The quality                            of the bowel preparation was good. Scope In: 11:54:05 AM Scope Out: 12:16:25 PM Scope Withdrawal Time: 0 hours 17 minutes 40 seconds  Total Procedure Duration: 0 hours 22 minutes 20 seconds  Findings:      The perianal and digital rectal examinations were normal.      A 5 mm polyp was found in the descending colon. The polyp was sessile.       The polyp was removed with a cold snare. Resection and retrieval were       complete.      Non-bleeding internal hemorrhoids were found during retroflexion. The       hemorrhoids were small. Impression:               - One 5 mm polyp in the descending colon, removed                            with a cold snare. Resected and retrieved.                           - Non-bleeding internal hemorrhoids. Moderate Sedation:      Per Anesthesia Care Recommendation:           - Discharge patient to home (ambulatory).                           - Resume previous diet.                           - Await pathology results.                           - Repeat colonoscopy for surveillance based on  pathology results. Procedure Code(s):        --- Professional ---                           [id number], Colonoscopy, flexible; with removal of                            tumor(s), polyp(s), or other lesion(s) by snare                            technique Diagnosis Code(s):        --- Professional ---                           Z86.010, Personal history of colonic polyps                           D12.4, Benign neoplasm of descending colon                           K64.8, Other hemorrhoids CPT copyright 2022 [employer]. All rights reserved. The codes documented in this report are  preliminary and upon coder review may  be revised to meet current compliance requirements. Katrinka Blazinganiel Castaneda, MD Katrinka Blazinganiel Castaneda,  09/12/2022 12:22:08 PM This report has been signed electronically. Number of Addenda: 0

## 2022-09-12 NOTE — Interval H&P Note (Signed)
History and Physical Interval Note:  09/12/2022 11:21 AM  Dennis Miles  has presented today for surgery, with the diagnosis of history of polyps; eosinophilic esophagitis.  The various methods of treatment have been discussed with the patient and family. After consideration of risks, benefits and other options for treatment, the patient has consented to  Procedure(s) with comments: COLONOSCOPY WITH PROPOFOL (N/A) - 12:30PM; ASA 1 ESOPHAGOGASTRODUODENOSCOPY (EGD) WITH PROPOFOL (N/A) - 12:30PM;ASA 1 as a surgical intervention.  The patient's history has been reviewed, patient examined, no change in status, stable for surgery.  I have reviewed the patient's chart and labs.  Questions were answered to the patient's satisfaction.     [name]

## 2022-09-12 NOTE — Anesthesia Preprocedure Evaluation (Signed)
Anesthesia Evaluation  Patient identified by MRN, date of birth, ID band Patient awake    Reviewed: Allergy & Precautions, H&P , NPO status , Patient's Chart, lab work & pertinent test results, reviewed documented beta blocker date and time   Airway Mallampati: II  TM Distance: >3 FB Neck ROM: full    Dental no notable dental hx.    Pulmonary neg pulmonary ROS   Pulmonary exam normal breath sounds clear to auscultation       Cardiovascular Exercise Tolerance: Good hypertension, negative cardio ROS  Rhythm:regular Rate:Normal     Neuro/Psych  Headaches  Anxiety      Neuromuscular disease negative neurological ROS  negative psych ROS   GI/Hepatic negative GI ROS, Neg liver ROS, hiatal hernia,GERD  ,,  Endo/Other  negative endocrine ROS    Renal/GU negative Renal ROS  negative genitourinary   Musculoskeletal   Abdominal   Peds  Hematology negative hematology ROS (+)   Anesthesia Other Findings   Reproductive/Obstetrics negative OB ROS                             Anesthesia Physical Anesthesia Plan  ASA: 2  Anesthesia Plan: General   Post-op Pain Management:    Induction:   PONV Risk Score and Plan: Propofol infusion  Airway Management Planned:   Additional Equipment:   Intra-op Plan:   Post-operative Plan:   Informed Consent: I have reviewed the patients History and Physical, chart, labs and discussed the procedure including the risks, benefits and alternatives for the proposed anesthesia with the patient or authorized representative who has indicated his/her understanding and acceptance.     Dental Advisory Given  Plan Discussed with: CRNA  Anesthesia Plan Comments:        Anesthesia Quick Evaluation

## 2022-09-12 NOTE — Op Note (Addendum)
[facility] Patient Name: Dennis Miles Procedure Date: 09/12/2022 11:24 AM MRN: [id number] Date of Birth: 1950-10-03 Attending MD: Katrinka Blazing , , [id number] CSN: [id number] Age: 72 Admit Type: Outpatient Procedure:                Upper GI endoscopy Indications:              Dysphagia, Suspected eosinophilic esophagitis Providers:                Katrinka Blazing, Crystal Page, Kristine L. Jessee Avers, Technician Referring MD:              Medicines:                Monitored Anesthesia Care Complications:            No immediate complications. Estimated Blood Loss:     Estimated blood loss: none. Procedure:                Pre-Anesthesia Assessment:                           - Prior to the procedure, a History and Physical                            was performed, and patient medications, allergies                            and sensitivities were reviewed. The patient's                            tolerance of previous anesthesia was reviewed.                           - The risks and benefits of the procedure and the                            sedation options and risks were discussed with the                            patient. All questions were answered and informed                            consent was obtained.                           - ASA Grade Assessment: II - A patient with mild                            systemic disease.                           After obtaining informed consent, the endoscope was                            passed under direct vision. Throughout the  procedure, the patient's blood pressure, pulse, and                            oxygen saturations were monitored continuously. The                            GIF-H190 ([id number] scope was introduced through the                            mouth, and advanced to the second part of duodenum.                            The upper GI endoscopy was  accomplished without                            difficulty. The patient tolerated the procedure                            well. Scope In: 11:36:04 AM Scope Out: 11:46:20 AM Total Procedure Duration: 0 hours 10 minutes 16 seconds  Findings:      Mucosal changes characterized by presence of mild longitudinal furrows       were found in the entire esophagus. Biopsies were obtained from the       proximal and distal esophagus with cold forceps for histology of       suspected eosinophilic esophagitis.      One benign-appearing, intrinsic moderate (circumferential scarring or       stenosis; an endoscope may pass) stenosis was found at the       gastroesophageal junction. This stenosis measured 9 mm (inner diameter)       x 1 cm (in length). The stenosis was traversed. A cold forceps was used       to disrupt the rim of the stricture. A TTS dilator was passed through       the scope. Dilation with a 12-13.5-15 mm balloon dilator was performed       to 15 mm. The dilation site was examined following endoscope reinsertion       and showed moderate mucosal disruption.      The stomach was normal.      The examined duodenum was normal. Impression:               - Presence of mild longitudinal furrows mucosa in                            the esophagus.                           - Benign-appearing esophageal stenosis. Dilated.                           - Normal stomach.                           - Normal examined duodenum.                           - Biopsies were taken with  a cold forceps for                            evaluation of eosinophilic esophagitis. Moderate Sedation:      Per Anesthesia Care Recommendation:           - Discharge patient to home (ambulatory).                           - Resume previous diet.                           - Use Protonix (pantoprazole) 40 mg PO BID.                           - Await pathology results. Procedure Code(s):        --- Professional ---                            [phone], Esophagogastroduodenoscopy, flexible,                            transoral; with transendoscopic balloon dilation of                            esophagus (less than 30 mm diameter)                           43239, 59, Esophagogastroduodenoscopy, flexible,                            transoral; with biopsy, single or multiple Diagnosis Code(s):        --- Professional ---                           K22.2, Esophageal obstruction                           R13.10, Dysphagia, unspecified CPT copyright 2022 American Medical Association. All rights reserved. The codes documented in this report are preliminary and upon coder review may  be revised to meet current compliance requirements. Katrinka Blazing, MD Katrinka Blazing,  09/12/2022 11:53:19 AM This report has been signed electronically. Number of Addenda: 0

## 2022-09-13 LAB — SURGICAL PATHOLOGY

## 2022-09-13 NOTE — Anesthesia Postprocedure Evaluation (Signed)
Anesthesia Post Note  Patient: YEELENG SMET  Procedure(s) Performed: COLONOSCOPY WITH PROPOFOL ESOPHAGOGASTRODUODENOSCOPY (EGD) WITH PROPOFOL BALLOON DILATION BIOPSY POLYPECTOMY INTESTINAL  Patient location during evaluation: Phase II Anesthesia Type: General Level of consciousness: awake Pain management: pain level controlled Vital Signs Assessment: post-procedure vital signs reviewed and stable Respiratory status: spontaneous breathing and respiratory function stable Cardiovascular status: blood pressure returned to baseline and stable Postop Assessment: no headache and no apparent nausea or vomiting Anesthetic complications: no Comments: Late entry   No notable events documented.   Last Vitals:  Vitals:   09/12/22 1119 09/12/22 1218  BP: (!) 168/99 (!) 139/91  Pulse: 74 81  Resp: 19 19  Temp: 36.8 C (!) 36.4 C  SpO2: 96% 98%    Last Pain:  Vitals:   09/12/22 1218  TempSrc: Oral  PainSc: 5                  Windell Norfolk

## 2022-09-16 NOTE — Progress Notes (Signed)
(  1) ruled in eosinophilic esophagitis

## 2022-09-19 ENCOUNTER — Encounter (HOSPITAL_COMMUNITY): Payer: Self-pay | Admitting: Gastroenterology

## 2022-09-25 ENCOUNTER — Ambulatory Visit (INDEPENDENT_AMBULATORY_CARE_PROVIDER_SITE_OTHER): Payer: Medicare Other

## 2022-09-25 DIAGNOSIS — J309 Allergic rhinitis, unspecified: Secondary | ICD-10-CM

## 2022-09-30 DIAGNOSIS — E782 Mixed hyperlipidemia: Secondary | ICD-10-CM | POA: Diagnosis not present

## 2022-09-30 DIAGNOSIS — M1A00X1 Idiopathic chronic gout, unspecified site, with tophus (tophi): Secondary | ICD-10-CM | POA: Diagnosis not present

## 2022-09-30 DIAGNOSIS — R7301 Impaired fasting glucose: Secondary | ICD-10-CM | POA: Diagnosis not present

## 2022-10-01 LAB — LAB REPORT - SCANNED
A1c: 5.5
Albumin, Urine POC: 3.7
Creatinine, POC: 157.1 mg/dL
EGFR: 77
Microalb Creat Ratio: 2

## 2022-10-03 NOTE — Progress Notes (Addendum)
Office Visit Note  Patient: Dennis Miles             Date of Birth: 03-Jul-1950           MRN: [id number]             PCP: Benita Stabile, MD Referring: Benita Stabile, MD Visit Date: 10/16/2022 Occupation: @GUAROCC @  Subjective:  Pain in multiple joints and muscles   History of Present Illness: Dennis Miles is a 72 y.o. male with history of gout, osteoarthritis and degenerative disc disease.  He states he has been taking allopurinol 300 mg.  He states he continues to have pain and discomfort in his right knee joint.  He is also has intermittent swelling.  He states when he takes Aleve the swelling goes down and he feels better.  He has been followed by Dr. Despina Hick.  He is getting viscosupplement injections.  He has been also having some discomfort in his cervical spine and his bilateral feet.  He complains of some discomfort in his bilateral hip joints.  Denies any difficulty walking.  He states his muscles cramp and ache at times.  None of the other joints are painful or swollen.    Activities of Daily Living:  Patient reports morning stiffness for 15 minutes.   Patient Reports nocturnal pain.  Difficulty dressing/grooming: Denies Difficulty climbing stairs: Denies Difficulty getting out of chair: Denies Difficulty using hands for taps, buttons, cutlery, and/or writing: Denies  Review of Systems  Constitutional:  Positive for fatigue.  HENT:  Positive for mouth dryness. Negative for mouth sores.   Eyes:  Negative for dryness.  Respiratory:  Negative for shortness of breath.   Cardiovascular:  Negative for chest pain and palpitations.  Gastrointestinal:  Positive for constipation and diarrhea. Negative for blood in stool.  Endocrine: Negative for increased urination.  Genitourinary:  Negative for involuntary urination.  Musculoskeletal:  Positive for joint pain, joint pain, joint swelling, myalgias, morning stiffness, muscle tenderness and myalgias. Negative for gait problem and  muscle weakness.  Skin:  Positive for sensitivity to sunlight. Negative for color change, rash and hair loss.  Allergic/Immunologic: Negative for susceptible to infections.  Neurological:  Positive for dizziness. Negative for headaches.  Hematological:  Negative for swollen glands.  Psychiatric/Behavioral:  Negative for depressed mood and sleep disturbance. The patient is not nervous/anxious.     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   Abdominal pain, left lower quadrant 10/19/2012   Gastroesophageal reflux disease 04/15/2012   Gout 04/01/2011   Hypertension 04/01/2011   Elevated cholesterol with high triglycerides 04/01/2011    Past Medical History:  Diagnosis Date   Anxiety    GERD (gastroesophageal reflux disease)    Gout    Headache(784.0)    Hiatal hernia    High cholesterol    Hypertension    Skin cancer     Family History  Problem Relation Age of Onset   Aneurysm Father    Asthma Child  Colon cancer Neg Hx    Past Surgical History:  Procedure Laterality Date   APPENDECTOMY     BALLOON DILATION  09/12/2022   Procedure: BALLOON DILATION;  Surgeon: Dolores Frame, MD;  [facility]: [facility];  Service: Gastroenterology;;   BIOPSY  05/30/2021   Procedure: BIOPSY;  Surgeon: Malissa Hippo, MD;  [facility]: [facility];  Service: Endoscopy;;   BIOPSY  09/12/2022   Procedure: BIOPSY;  Surgeon: Marguerita Merles, Reuel Boom, MD;   Location: [facility];  Service: Gastroenterology;;   CATARACT EXTRACTION Right    retinal procedure done at same time per patient   COLONOSCOPY N/A 10/23/2012   Procedure: COLONOSCOPY;  Surgeon: Malissa Hippo, MD;  Location: [facility];  Service: Endoscopy;  Laterality: N/A;  235   COLONOSCOPY WITH PROPOFOL N/A 09/12/2022   Procedure: COLONOSCOPY WITH PROPOFOL;  Surgeon: Dolores Frame, MD;  [facility]: [facility];  Service: Gastroenterology;  Laterality: N/A;  12:30PM; ASA 1   ESOPHAGEAL DILATION N/A 05/30/2021   Procedure: ESOPHAGEAL DILATION;  Surgeon: Malissa Hippo, MD;  [facility];  Service: Endoscopy;  Laterality: N/A;   ESOPHAGOGASTRODUODENOSCOPY  04/17/2011   Procedure: ESOPHAGOGASTRODUODENOSCOPY (EGD);  Surgeon: Malissa Hippo, MD;  [facility];  Service: Endoscopy;  Laterality: N/A;  8:30   ESOPHAGOGASTRODUODENOSCOPY (EGD) WITH PROPOFOL N/A 05/30/2021   Procedure: ESOPHAGOGASTRODUODENOSCOPY (EGD) WITH PROPOFOL;  Surgeon: Malissa Hippo, MD;  [facility];  Service: Endoscopy;  Laterality: N/A;  7:30   ESOPHAGOGASTRODUODENOSCOPY (EGD) WITH PROPOFOL N/A 09/12/2022   Procedure: ESOPHAGOGASTRODUODENOSCOPY (EGD) WITH PROPOFOL;  Surgeon: Dolores Frame, MD;  [facility];  Service: Gastroenterology;  Laterality: N/A;  12:30PM;ASA 1   EYE SURGERY     HAND SURGERY  05/28/2016   INGUINAL HERNIA REPAIR Left 02/17/2013   Procedure: HERNIA REPAIR INGUINAL ADULT;  Surgeon: Dalia Heading, MD;  Location: [facility];  Service: General;  Laterality: Left;   INSERTION OF MESH Left 02/17/2013   Procedure: INSERTION OF MESH;  Surgeon: Dalia Heading, MD;  Location: [facility];  Service: General;  Laterality: Left;   KNEE ARTHROSCOPY W/ MENISCAL REPAIR Right 11/2021   POLYPECTOMY  09/12/2022   Procedure: POLYPECTOMY INTESTINAL;  Surgeon: Dolores Frame, MD;  [facility];  Service: Gastroenterology;;    SINOSCOPY     sinus sugery     SKIN CANCER EXCISION     x4   SQUAMOUS CELL CARCINOMA EXCISION Left 2023   forearm   Social History   Social History Narrative   Lives at home with daughter.  Retired.  Education 12th grade.  Children 2.     Immunization History  Administered Date(s) Administered   PFIZER(Purple Top)SARS-COV-2 Vaccination 08/02/2019, 08/23/2019, 06/22/2020     Objective: Vital Signs: BP 133/84 (BP Location: Left Arm, Patient Position: Sitting, Cuff Size: Normal)   Pulse 68   Resp 15   Ht 5\' 9"  (1.753 m)   Wt 168 lb (76.2 kg)   BMI 24.81 kg/m    Physical Exam Vitals and nursing note reviewed.  Constitutional:      Appearance: He is well-developed.  HENT:     Head: Normocephalic and atraumatic.  Eyes:     Conjunctiva/sclera: Conjunctivae normal.     Pupils: Pupils are equal, round, and reactive to light.  Cardiovascular:     Rate and Rhythm: Normal rate and regular rhythm.     Heart sounds: Normal heart sounds.  Pulmonary:  Effort: Pulmonary effort is normal.     Breath sounds: Normal breath sounds.  Abdominal:     General: Bowel sounds are normal.     Palpations: Abdomen is soft.  Musculoskeletal:     Cervical back: Normal range of motion and neck supple.  Skin:    General: Skin is warm and dry.     Capillary Refill: Capillary refill takes less than 2 seconds.  Neurological:     Mental Status: He is alert and oriented to person, place, and time.  Psychiatric:        Behavior: Behavior normal.      Musculoskeletal Exam: He had limited lateral rotation of the cervical spine.  There was no tenderness or discomfort over thoracic or lumbar spine.  Shoulder joints, elbow joints, wrist joints were in good range of motion.  He had bilateral PIP and DIP thickening in his hands with no synovitis.  Hip joints were in good range of motion without any discomfort.  He had moderate effusion in his right knee joint without much warmth.  Left knee joint was in  good range of motion.  He had mild tenderness over right second MTP joint without any warmth or   CDAI Exam: CDAI Score: -- Patient Global: --; Provider Global: -- Swollen: --; Tender: -- Joint Exam 10/16/2022   No joint exam has been documented for this visit   There is currently no information documented on the homunculus. Go to the Rheumatology activity and complete the homunculus joint exam.  Investigation: No additional findings.  Imaging: No results found.  Recent Labs: Lab Results  Component Value Date   WBC 6.2 10/30/2021   HGB 13.8 10/30/2021   PLT 211 10/30/2021   NA 139 10/30/2021   K 4.4 10/30/2021   CL 100 10/30/2021   CO2 28 10/30/2021   GLUCOSE 121 (H) 10/30/2021   BUN 7 10/30/2021   CREATININE 1.00 10/30/2021   BILITOT 0.5 10/30/2021   ALKPHOS 71 09/26/2016   AST 24 10/30/2021   ALT 18 10/30/2021   PROT 6.6 10/30/2021   ALBUMIN 4.2 09/26/2016   CALCIUM 9.1 10/30/2021   GFRAA 75 07/11/2020     Speciality Comments: No specialty comments available.  Procedures:  No procedures performed Allergies: Codeine, Crestor [rosuvastatin], Lipitor [atorvastatin], and Zetia [ezetimibe]   Assessment / Plan:     Visit Diagnoses: Idiopathic chronic gout of multiple sites with tophus -patient denies having gout flare since the last visit.  He states he has been taking allopurinol 300 mg tablet 1 tablet alternating with 1/2 tablet every other day.  He is concerned about the side effects of allopurinol and did not take the full dose.  He continues to have pain and swelling in his right knee joint.  He recently has been experiencing some discomfort in his right second toe but she has not noticed any swelling.  He states it may be coming from climbing ladders and from wearing shoes.  No swelling was noted over the toe.  I did detailed discussion with the patient regarding taking allopurinol 300 mg on a daily basis.  The goal is to keep his uric acid around 4.0.  Patient  states he had labs at Dr. Scharlene Gloss office last week which showed a uric acid of 6.2.  Low purine diet was also discussed.  He states he feels better when he takes Aleve but he avoids Aleve due to reflux.  Medication management-October 01, 2022 uric acid 6.1, hemoglobin A1c 5.5, creatinine albumin  ratio normal.,  LDL 95, triglycerides 527, CMP normal AST 28 ALT 23 creatinine 1.05, CBC WBC 5.9, hemoglobin 13.9, platelets 227  Effusion, right knee -he continues to have right knee joint effusion.  Patient states he has been having aspiration and injections Dr. Tana Felts office.  He has an appointment coming up next month.  Synovial fluid aspiration in the past did not show much inflammation.  Crystals were negative.  I advised him to get cell count and crystals with his next knee aspiration at [facility].  Chronic pain of right knee - He underwent right knee arthroscopic surgery in May 2023 performed by Dr. Roda Shutters.  Patient states he had no relief in his knee joint symptoms.  I reviewed his x-rays of the right knee joint from July 12, 2021 which showed only mild osteoarthritis.  No chondrocalcinosis was noted.  Primary osteoarthritis of both feet-he has osteoarthritis in his feet.  He has been having increased discomfort at the base of his second MTP joint.  No swelling or redness was noted.  He is relates discomfort from most likely climbing ladders and wearing shoes.  Proper fitting shoes were advised.  DDD (degenerative disc disease), cervical-he continues to have some stiffness in his cervical spine and has limited range of motion on lateral rotation.  He has exercises at home.  I advised him to do stretching exercises on a daily basis.  Primary insomnia - Ambien 10 mg p.o. nightly as needed for insomnia.  History of hyperlipidemia-patient states he will be starting on statin soon.  History of gastroesophageal reflux (GERD)-he gets reflux symptoms and takes Protonix.  History of hypertension-blood  pressure was elevated at 145/83.  Repeat blood pressure was 133/84.  He was advised to monitor blood pressure closely.  Orders: No orders of the defined types were placed in this encounter.  No orders of the defined types were placed in this encounter.   Follow-Up Instructions: Return in about 3 months (around 01/16/2023) for Gout.   Pollyann Savoy, MD  Note - This record has been created using Animal nutritionist.  Chart creation errors have been sought, but may not always  have been located. Such creation errors do not reflect on  the standard of medical care.

## 2022-10-04 DIAGNOSIS — K219 Gastro-esophageal reflux disease without esophagitis: Secondary | ICD-10-CM | POA: Diagnosis not present

## 2022-10-04 DIAGNOSIS — I2510 Atherosclerotic heart disease of native coronary artery without angina pectoris: Secondary | ICD-10-CM | POA: Diagnosis not present

## 2022-10-04 DIAGNOSIS — E875 Hyperkalemia: Secondary | ICD-10-CM | POA: Diagnosis not present

## 2022-10-04 DIAGNOSIS — I10 Essential (primary) hypertension: Secondary | ICD-10-CM | POA: Diagnosis not present

## 2022-10-04 DIAGNOSIS — M1A9XX1 Chronic gout, unspecified, with tophus (tophi): Secondary | ICD-10-CM | POA: Diagnosis not present

## 2022-10-04 DIAGNOSIS — G5600 Carpal tunnel syndrome, unspecified upper limb: Secondary | ICD-10-CM | POA: Diagnosis not present

## 2022-10-04 DIAGNOSIS — K200 Eosinophilic esophagitis: Secondary | ICD-10-CM | POA: Diagnosis not present

## 2022-10-04 DIAGNOSIS — R7301 Impaired fasting glucose: Secondary | ICD-10-CM | POA: Diagnosis not present

## 2022-10-04 DIAGNOSIS — E782 Mixed hyperlipidemia: Secondary | ICD-10-CM | POA: Diagnosis not present

## 2022-10-04 DIAGNOSIS — J309 Allergic rhinitis, unspecified: Secondary | ICD-10-CM | POA: Diagnosis not present

## 2022-10-04 DIAGNOSIS — G4700 Insomnia, unspecified: Secondary | ICD-10-CM | POA: Diagnosis not present

## 2022-10-04 DIAGNOSIS — G243 Spasmodic torticollis: Secondary | ICD-10-CM | POA: Diagnosis not present

## 2022-10-07 ENCOUNTER — Telehealth: Payer: Self-pay | Admitting: *Deleted

## 2022-10-07 NOTE — Telephone Encounter (Signed)
Labs received from:Dr. Nita Sells  Drawn on:09/30/2022  Reviewed by: Sherron Ales, PA-C  Labs drawn: Uric Acid, Hgb, Albumin/Creat. Ratio, Lipid Panel, CMP, CBC  Results: Uric Acid 6.1   Cholesterol, Total 226   Triglycerides 527    V LDL 88    T. Chol/HDL Ratio 5.3    BUN 6    BUN/Creat. Ratio 6    RBC 4.05    MCV  100    MCH 34.3  Patient is on Allopurinol 200 mg po daily.

## 2022-10-08 ENCOUNTER — Telehealth: Payer: Self-pay | Admitting: Pharmacy Technician

## 2022-10-08 ENCOUNTER — Other Ambulatory Visit: Payer: Self-pay

## 2022-10-08 NOTE — Telephone Encounter (Signed)
Auth Submission: NO AUTH NEEDED Site of care:  Payer: MEDICARE A/B and BCBS SUPP Medication & CPT/J Code(s) submitted: Leqvio (Inclisiran) [id number] Route of submission (phone, fax, portal):  Phone # Fax # Auth type: Buy/Bill Units/visits requested: 2 Reference number:  Approval from: 10/08/22 to 06/03/23

## 2022-10-09 ENCOUNTER — Ambulatory Visit (INDEPENDENT_AMBULATORY_CARE_PROVIDER_SITE_OTHER): Payer: Medicare Other

## 2022-10-09 ENCOUNTER — Telehealth: Payer: Self-pay

## 2022-10-09 DIAGNOSIS — J309 Allergic rhinitis, unspecified: Secondary | ICD-10-CM

## 2022-10-09 NOTE — Telephone Encounter (Signed)
Patient lab result placed in provider Bjorn Pippin) box for review

## 2022-10-15 NOTE — Progress Notes (Signed)
VIALS EXP 10-15-23 

## 2022-10-16 ENCOUNTER — Ambulatory Visit: Payer: Medicare Other | Attending: Rheumatology | Admitting: Rheumatology

## 2022-10-16 ENCOUNTER — Encounter: Payer: Self-pay | Admitting: Rheumatology

## 2022-10-16 VITALS — BP 133/84 | HR 68 | Resp 15 | Ht 69.0 in | Wt 168.0 lb

## 2022-10-16 DIAGNOSIS — Z8719 Personal history of other diseases of the digestive system: Secondary | ICD-10-CM

## 2022-10-16 DIAGNOSIS — M19072 Primary osteoarthritis, left ankle and foot: Secondary | ICD-10-CM

## 2022-10-16 DIAGNOSIS — M25461 Effusion, right knee: Secondary | ICD-10-CM

## 2022-10-16 DIAGNOSIS — M19071 Primary osteoarthritis, right ankle and foot: Secondary | ICD-10-CM | POA: Insufficient documentation

## 2022-10-16 DIAGNOSIS — F5101 Primary insomnia: Secondary | ICD-10-CM | POA: Diagnosis not present

## 2022-10-16 DIAGNOSIS — M1A09X1 Idiopathic chronic gout, multiple sites, with tophus (tophi): Secondary | ICD-10-CM

## 2022-10-16 DIAGNOSIS — G8929 Other chronic pain: Secondary | ICD-10-CM

## 2022-10-16 DIAGNOSIS — Z8679 Personal history of other diseases of the circulatory system: Secondary | ICD-10-CM | POA: Diagnosis not present

## 2022-10-16 DIAGNOSIS — M5030 Other cervical disc degeneration, unspecified cervical region: Secondary | ICD-10-CM | POA: Diagnosis not present

## 2022-10-16 DIAGNOSIS — Z8639 Personal history of other endocrine, nutritional and metabolic disease: Secondary | ICD-10-CM | POA: Diagnosis not present

## 2022-10-16 DIAGNOSIS — J3081 Allergic rhinitis due to animal (cat) (dog) hair and dander: Secondary | ICD-10-CM | POA: Diagnosis not present

## 2022-10-16 DIAGNOSIS — Z79899 Other long term (current) drug therapy: Secondary | ICD-10-CM | POA: Diagnosis not present

## 2022-10-16 DIAGNOSIS — M25561 Pain in right knee: Secondary | ICD-10-CM | POA: Diagnosis not present

## 2022-10-16 NOTE — Patient Instructions (Signed)
Please take allopurinol 300 mg tablet 1 tablet daily.

## 2022-10-17 DIAGNOSIS — D2271 Melanocytic nevi of right lower limb, including hip: Secondary | ICD-10-CM | POA: Diagnosis not present

## 2022-10-17 DIAGNOSIS — D485 Neoplasm of uncertain behavior of skin: Secondary | ICD-10-CM | POA: Diagnosis not present

## 2022-10-17 DIAGNOSIS — J301 Allergic rhinitis due to pollen: Secondary | ICD-10-CM

## 2022-10-17 DIAGNOSIS — L570 Actinic keratosis: Secondary | ICD-10-CM | POA: Diagnosis not present

## 2022-10-17 DIAGNOSIS — Z85828 Personal history of other malignant neoplasm of skin: Secondary | ICD-10-CM | POA: Diagnosis not present

## 2022-10-17 DIAGNOSIS — L821 Other seborrheic keratosis: Secondary | ICD-10-CM | POA: Diagnosis not present

## 2022-10-17 DIAGNOSIS — L578 Other skin changes due to chronic exposure to nonionizing radiation: Secondary | ICD-10-CM | POA: Diagnosis not present

## 2022-10-17 DIAGNOSIS — D225 Melanocytic nevi of trunk: Secondary | ICD-10-CM | POA: Diagnosis not present

## 2022-10-17 DIAGNOSIS — D2272 Melanocytic nevi of left lower limb, including hip: Secondary | ICD-10-CM | POA: Diagnosis not present

## 2022-10-17 DIAGNOSIS — L814 Other melanin hyperpigmentation: Secondary | ICD-10-CM | POA: Diagnosis not present

## 2022-10-29 DIAGNOSIS — J029 Acute pharyngitis, unspecified: Secondary | ICD-10-CM | POA: Diagnosis not present

## 2022-10-29 DIAGNOSIS — R030 Elevated blood-pressure reading, without diagnosis of hypertension: Secondary | ICD-10-CM | POA: Diagnosis not present

## 2022-10-29 DIAGNOSIS — Z6825 Body mass index (BMI) 25.0-25.9, adult: Secondary | ICD-10-CM | POA: Diagnosis not present

## 2022-10-29 DIAGNOSIS — E663 Overweight: Secondary | ICD-10-CM | POA: Diagnosis not present

## 2022-11-01 ENCOUNTER — Ambulatory Visit (INDEPENDENT_AMBULATORY_CARE_PROVIDER_SITE_OTHER): Payer: Medicare Other

## 2022-11-01 DIAGNOSIS — J309 Allergic rhinitis, unspecified: Secondary | ICD-10-CM | POA: Diagnosis not present

## 2022-11-15 ENCOUNTER — Ambulatory Visit (INDEPENDENT_AMBULATORY_CARE_PROVIDER_SITE_OTHER): Payer: Medicare Other

## 2022-11-15 DIAGNOSIS — J309 Allergic rhinitis, unspecified: Secondary | ICD-10-CM | POA: Diagnosis not present

## 2022-11-19 DIAGNOSIS — C44319 Basal cell carcinoma of skin of other parts of face: Secondary | ICD-10-CM | POA: Diagnosis not present

## 2022-11-29 ENCOUNTER — Ambulatory Visit (INDEPENDENT_AMBULATORY_CARE_PROVIDER_SITE_OTHER): Payer: Medicare Other

## 2022-11-29 DIAGNOSIS — J309 Allergic rhinitis, unspecified: Secondary | ICD-10-CM | POA: Diagnosis not present

## 2022-12-09 ENCOUNTER — Encounter (INDEPENDENT_AMBULATORY_CARE_PROVIDER_SITE_OTHER): Payer: Self-pay | Admitting: Gastroenterology

## 2022-12-09 ENCOUNTER — Ambulatory Visit (INDEPENDENT_AMBULATORY_CARE_PROVIDER_SITE_OTHER): Payer: Medicare Other | Admitting: Gastroenterology

## 2022-12-09 VITALS — BP 136/81 | HR 73 | Temp 97.5°F | Ht 69.0 in | Wt 164.4 lb

## 2022-12-09 DIAGNOSIS — K219 Gastro-esophageal reflux disease without esophagitis: Secondary | ICD-10-CM

## 2022-12-09 DIAGNOSIS — K200 Eosinophilic esophagitis: Secondary | ICD-10-CM

## 2022-12-09 NOTE — Progress Notes (Signed)
Dennis Miles, M.D. Gastroenterology & Hepatology [facility]enter[facility] [address] [place], [place] [address]  Primary Care Physician: Benita Stabile, MD [address] [place] [address]  I will communicate my assessment and recommendations to the referring MD via EMR.  Problems: GERD Eosinophilic esophagitis   History of Present Illness: Dennis Miles is a 72 y.o. male with PMH GERD, EoE,  anxiety, HTN, gout, skin cancer, who presents for follow up of EoE and GERD.  The patient was last seen on 08/15/2022. At that time, the patient was scheduled for an EGD and colonoscopy with fine described below.  He was continued initially on pantoprazole 40 mg every other day but given endoscopic findings this was increased to 40 mg twice daily..  Patient reports that since his most recent procedures, he  only had one episode where he felt pressure in his chest for a brief period of time after eating meatloaf. He denies any more choking or dysphagia problems. No heartburn. He is currently taking pantoprazole 40 mg BID, waits at least 30 minutes to have breakfast. Denies any chest pain, nausea, vomiting, fever, chills, hematochezia, melena, hematemesis, abdominal distention, abdominal pain, diarrhea, jaundice, pruritus or weight loss.  Last EGD: 09/12/2022 Presence of hers in the esophagus, stenosis found at the GE junction which measured 9 mm in diameter, this was dilated up to 15 mm, normal stomach and duodenum. Biopsies of the esophagus consistent with active eosinophilic esophagitis.  Last Colonoscopy: 09/12/2022 5 mm polyp in the descending colon, internal hemorrhoids. Pathology consistent with a hyperplastic polyp  Repeat colonoscopy in 10 years.  Past Medical History: Past Medical History:  Diagnosis Date   Anxiety    GERD (gastroesophageal reflux disease)    Gout    Headache(784.0)    Hiatal hernia    High cholesterol    Hypertension     Skin cancer     Past Surgical History: Past Surgical History:  Procedure Laterality Date   APPENDECTOMY     BALLOON DILATION  09/12/2022   Procedure: BALLOON DILATION;  Surgeon: Dolores Frame, MD;  [facility];  Service: Gastroenterology;;   BIOPSY  05/30/2021   Procedure: BIOPSY;  Surgeon: Malissa Hippo, MD;  [facility];  Service: Endoscopy;;   BIOPSY  09/12/2022   Procedure: BIOPSY;  Surgeon: Marguerita Merles, Reuel Boom, MD;  [facility];  Service: Gastroenterology;;   CATARACT EXTRACTION Right    retinal procedure done at same time per patient   COLONOSCOPY N/A 10/23/2012   Procedure: COLONOSCOPY;  Surgeon: Malissa Hippo, MD;  [facility];  Service: Endoscopy;  Laterality: N/A;  235   COLONOSCOPY WITH PROPOFOL N/A 09/12/2022   Procedure: COLONOSCOPY WITH PROPOFOL;  Surgeon: Dolores Frame, MD;  [facility];  Service: Gastroenterology;  Laterality: N/A;  12:30PM; ASA 1   ESOPHAGEAL DILATION N/A 05/30/2021   Procedure: ESOPHAGEAL DILATION;  Surgeon: Malissa Hippo, MD;  [facility];  Service: Endoscopy;  Laterality: N/A;   ESOPHAGOGASTRODUODENOSCOPY  04/17/2011   Procedure: ESOPHAGOGASTRODUODENOSCOPY (EGD);  Surgeon: Malissa Hippo, MD;  [facility];  Service: Endoscopy;  Laterality: N/A;  8:30   ESOPHAGOGASTRODUODENOSCOPY (EGD) WITH PROPOFOL N/A 05/30/2021   Procedure: ESOPHAGOGASTRODUODENOSCOPY (EGD) WITH PROPOFOL;  Surgeon: Malissa Hippo, MD;  [facility];  Service: Endoscopy;  Laterality: N/A;  7:30   ESOPHAGOGASTRODUODENOSCOPY (EGD) WITH PROPOFOL N/A 09/12/2022   Procedure: ESOPHAGOGASTRODUODENOSCOPY (EGD)  WITH PROPOFOL;  Surgeon: Marguerita Merles, Reuel Boom, MD;  [facility];  Service: Gastroenterology;  Laterality: N/A;  12:30PM;ASA 1   EYE SURGERY     HAND SURGERY  05/28/2016   INGUINAL HERNIA REPAIR Left 02/17/2013   Procedure: HERNIA REPAIR  INGUINAL ADULT;  Surgeon: Dalia Heading, MD;  [facility];  Service: General;  Laterality: Left;   INSERTION OF MESH Left 02/17/2013   Procedure: INSERTION OF MESH;  Surgeon: Dalia Heading, MD;  [facility];  Service: General;  Laterality: Left;   KNEE ARTHROSCOPY W/ MENISCAL REPAIR Right 11/2021   POLYPECTOMY  09/12/2022   Procedure: POLYPECTOMY INTESTINAL;  Surgeon: Dolores Frame, MD;  [facility];  Service: Gastroenterology;;   SINOSCOPY     sinus sugery     SKIN CANCER EXCISION     x4   SQUAMOUS CELL CARCINOMA EXCISION Left 2023   forearm    Family History: Family History  Problem Relation Age of Onset   Aneurysm Father    Asthma Child    Colon cancer Neg Hx     Social History: Social History   Tobacco Use  Smoking Status Never   Passive exposure: Never  Smokeless Tobacco Former   Types: Chew   Quit date: 06/03/2012   Social History   Substance and Sexual Activity  Alcohol Use Yes   Alcohol/week: 1.0 standard drink of alcohol   Types: 1 Cans of beer per week   Comment: Social   Social History   Substance and Sexual Activity  Drug Use Never    Allergies: Allergies  Allergen Reactions   Codeine Nausea Only   Crestor [Rosuvastatin] Other (See Comments)    myalgias   Lipitor [Atorvastatin] Other (See Comments)    Myalgias    Zetia [Ezetimibe] Other (See Comments)    myalgias    Medications: Current Outpatient Medications  Medication Sig Dispense Refill   allopurinol (ZYLOPRIM) 300 MG tablet Take 1 tablet every day by oral route.     Coenzyme Q10 (CO Q 10) 100 MG CAPS Take 100 mg by mouth daily.     cyclobenzaprine (FLEXERIL) 5 MG tablet Take 5 mg by mouth daily as needed for muscle spasms (pinched nerve).     EPINEPHRINE 0.3 mg/0.3 mL IJ SOAJ injection Inject 0.3 mg into the muscle as needed for anaphylaxis. 2 each 1   ezetimibe (ZETIA) 10 MG tablet Take 10 mg by mouth daily.     fluticasone (FLONASE) 50 MCG/ACT nasal  spray Place 2 sprays into both nostrils daily as needed for allergies. (Patient taking differently: Place 2 sprays into both nostrils 2 (two) times daily.) 16 g 5   levocetirizine (XYZAL) 5 MG tablet Take 1 tablet (5 mg total) by mouth daily as needed for allergies. (Patient taking differently: Take 5 mg by mouth daily.) 60 tablet 5   lisinopril (PRINIVIL,ZESTRIL) 10 MG tablet Take 10 mg by mouth daily.     Naproxen Sodium (ALEVE PO) Take by mouth as needed.     pantoprazole (PROTONIX) 40 MG tablet Take 1 tablet (40 mg total) by mouth 2 (two) times daily. 180 tablet 1   Phenylephrine-APAP-guaiFENesin (TYLENOL SINUS SEVERE) 5-325-200 MG TABS Take 2 tablets by mouth daily as needed (Headache).     PRESCRIPTION MEDICATION Allergy injections at the allergist office     zolpidem (AMBIEN) 10 MG tablet Take 10 mg by mouth at bedtime.     No current facility-administered medications for this visit.  Review of Systems: GENERAL: negative for malaise, night sweats HEENT: No changes in hearing or vision, no nose bleeds or other nasal problems. NECK: Negative for lumps, goiter, pain and significant neck swelling RESPIRATORY: Negative for cough, wheezing CARDIOVASCULAR: Negative for chest pain, leg swelling, palpitations, orthopnea GI: SEE HPI MUSCULOSKELETAL: Negative for joint pain or swelling, back pain, and muscle pain. SKIN: Negative for lesions, rash PSYCH: Negative for sleep disturbance, mood disorder and recent psychosocial stressors. HEMATOLOGY Negative for prolonged bleeding, bruising easily, and swollen nodes. ENDOCRINE: Negative for cold or heat intolerance, polyuria, polydipsia and goiter. NEURO: negative for tremor, gait imbalance, syncope and seizures. The remainder of the review of systems is noncontributory.   Physical Exam: BP 136/81 (BP Location: Left Arm, Patient Position: Sitting, Cuff Size: Normal)   Pulse 73   Temp (!) 97.5 F (36.4 C) (Temporal)   Ht 5\' 9"  (1.753 m)   Wt  164 lb 6.4 oz (74.6 kg)   BMI 24.28 kg/m  GENERAL: The patient is AO x3, in no acute distress. HEENT: Head is normocephalic and atraumatic. EOMI are intact. Mouth is well hydrated and without lesions. NECK: Supple. No masses LUNGS: Clear to auscultation. No presence of rhonchi/wheezing/rales. Adequate chest expansion HEART: RRR, normal s1 and s2. ABDOMEN: Soft, nontender, no guarding, no peritoneal signs, and nondistended. BS +. No masses. EXTREMITIES: Without any cyanosis, clubbing, rash, lesions or edema. NEUROLOGIC: AOx3, no focal motor deficit. SKIN: no jaundice, no rashes  Imaging/Labs: as above  I personally reviewed and interpreted the available labs, imaging and endoscopic files.  Impression and Plan: Dennis Miles is a 72 y.o. male with PMH GERD, EoE,  anxiety, HTN, gout, skin cancer, who presents for follow up of EoE and GERD.  Patient has presented significant improvement of his eosinophilic esophagitis symptoms and is not having any current complaints at the moment.  He has tolerated his PPI treatment adequately without significant side effects. Patient was re-assured regarding PPI use and safety of when appropriate indications in question. Most recent studies on PPI therapy that association of symptoms is not equivalent to causation and overall association with for example osteoporosis is weak and based on observational studies. When PPI use is indicated, it is safe to proceed with therapy and titrate dosing/use based on symptom response.  We discussed the need to evaluate mucosal and microscopic improvement will repeat EGD 6 months after his most recent procedure which will be scheduled in mid October.  For now we will continue with the same dose of Protonix. Dysphagia precautions provided  - Continue pantoprazole 40 mg twice daily - Schedule EGD in mid October 2024. - Chew food thoroughly to avoid choking episodes.  All questions were answered.      Dennis Blazing,  MD Gastroenterology and Hepatology [facility] Gastroenterology

## 2022-12-09 NOTE — Patient Instructions (Signed)
Continue pantoprazole 40 mg twice daily Schedule EGD in mid October 2024. Chew food thoroughly to avoid choking episodes.

## 2022-12-13 ENCOUNTER — Ambulatory Visit (INDEPENDENT_AMBULATORY_CARE_PROVIDER_SITE_OTHER): Payer: Medicare Other

## 2022-12-13 DIAGNOSIS — J309 Allergic rhinitis, unspecified: Secondary | ICD-10-CM | POA: Diagnosis not present

## 2022-12-18 ENCOUNTER — Other Ambulatory Visit: Payer: Self-pay

## 2022-12-22 NOTE — Progress Notes (Unsigned)
Cardiology Office Note:    Date:  12/24/2022   ID:  Dennis Miles, [place] Meo July 21, 1950, MRN [id number]  PCP:  Benita Stabile, MD  Cardiologist:  None  Electrophysiologist:  None   Referring MD: Benita Stabile, MD   Chief Complaint  Patient presents with   Coronary Artery Disease    History of Present Illness:    Dennis Miles is a 72 y.o. male with a hx of hypertension, hypercholesterol who presents for follow-up.  He was referred by Dr. Margo Aye for evaluation of chest pain and dyspnea, initially seen on 06/20/2021.  He reports he has been having chest pain x6 months.  Describes pressure in center chest.  Underwent endoscopy and found to have possible eosinophilic esophagitis.  He was started on steroid inhaler and reports significant improvement.  Does report though that he has been having dyspnea with exertion.  States that he notices it when he goes for long walks.  He tries to walk each day for 15 minutes.  He denies any lightheadedness, syncope, lower extremity edema, or palpitations.  No smoking history.  Family history includes mother had CABG in 26s.  Brother had MI in 9s and died of brain aneurysm in 80s.  Father also died of brain aneurysm in 17s.  Coronary CTA on 06/26/2021 showed mild CAD with calcified plaque in the proximal LAD causing 25 to 49% stenosis, calcium score 100 (48th percentile).  Since last clinic visit, he reports he is doing well.  Continues to have intermittent chest pain, particularly if eats certain foods.  He denies any shortness of breath or palpitations.  Does report some lightheadedness but none recently, denies any syncope.  Reports some swelling in his legs.  Has not been exercising recently.   Past Medical History:  Diagnosis Date   Anxiety    GERD (gastroesophageal reflux disease)    Gout    Headache(784.0)    Hiatal hernia    High cholesterol    Hypertension    Skin cancer     Past Surgical History:  Procedure Laterality Date   APPENDECTOMY      BALLOON DILATION  09/12/2022   Procedure: BALLOON DILATION;  Surgeon: Dolores Frame, MD;  [facility];  Service: Gastroenterology;;   BIOPSY  05/30/2021   Procedure: BIOPSY;  Surgeon: Malissa Hippo, MD;  [facility];  Service: Endoscopy;;   BIOPSY  09/12/2022   Procedure: BIOPSY;  Surgeon: Marguerita Merles, Reuel Boom, MD;  [facility];  Service: Gastroenterology;;   CATARACT EXTRACTION Right    retinal procedure done at same time per patient   COLONOSCOPY N/A 10/23/2012   Procedure: COLONOSCOPY;  Surgeon: Malissa Hippo, MD;  [facility];  Service: Endoscopy;  Laterality: N/A;  235   COLONOSCOPY WITH PROPOFOL N/A 09/12/2022   Procedure: COLONOSCOPY WITH PROPOFOL;  Surgeon: Dolores Frame, MD;  [facility];  Service: Gastroenterology;  Laterality: N/A;  12:30PM; ASA 1   ESOPHAGEAL DILATION N/A 05/30/2021   Procedure: ESOPHAGEAL DILATION;  Surgeon: Malissa Hippo, MD;  [facility];  Service: Endoscopy;  Laterality: N/A;   ESOPHAGOGASTRODUODENOSCOPY  04/17/2011   Procedure: ESOPHAGOGASTRODUODENOSCOPY (EGD);  Surgeon: Malissa Hippo, MD;  [facility];  Service: Endoscopy;  Laterality: N/A;  8:30   ESOPHAGOGASTRODUODENOSCOPY (EGD) WITH PROPOFOL N/A 05/30/2021   Procedure: ESOPHAGOGASTRODUODENOSCOPY (EGD) WITH PROPOFOL;  Surgeon: Malissa Hippo, MD;  [facility];  Service: Endoscopy;  Laterality:  N/A;  7:30   ESOPHAGOGASTRODUODENOSCOPY (EGD) WITH PROPOFOL N/A 09/12/2022   Procedure: ESOPHAGOGASTRODUODENOSCOPY (EGD) WITH PROPOFOL;  Surgeon: Dolores Frame, MD;  [facility];  Service: Gastroenterology;  Laterality: N/A;  12:30PM;ASA 1   EYE SURGERY     HAND SURGERY  05/28/2016   INGUINAL HERNIA REPAIR Left 02/17/2013   Procedure: HERNIA REPAIR INGUINAL ADULT;  Surgeon: Dalia Heading, MD;  [facility];  Service: General;  Laterality: Left;   INSERTION  OF MESH Left 02/17/2013   Procedure: INSERTION OF MESH;  Surgeon: Dalia Heading, MD;  [facility];  Service: General;  Laterality: Left;   KNEE ARTHROSCOPY W/ MENISCAL REPAIR Right 11/2021   POLYPECTOMY  09/12/2022   Procedure: POLYPECTOMY INTESTINAL;  Surgeon: Dolores Frame, MD;  [facility];  Service: Gastroenterology;;   SINOSCOPY     sinus sugery     SKIN CANCER EXCISION     x4   SQUAMOUS CELL CARCINOMA EXCISION Left 2023   forearm    Current Medications: Current Meds  Medication Sig   allopurinol (ZYLOPRIM) 300 MG tablet Take 1 tablet every day by oral route.   Coenzyme Q10 (CO Q 10) 100 MG CAPS Take 100 mg by mouth daily.   cyclobenzaprine (FLEXERIL) 5 MG tablet Take 5 mg by mouth daily as needed for muscle spasms (pinched nerve).   EPINEPHRINE 0.3 mg/0.3 mL IJ SOAJ injection Inject 0.3 mg into the muscle as needed for anaphylaxis.   ezetimibe (ZETIA) 10 MG tablet Take 10 mg by mouth daily.   fluticasone (FLONASE) 50 MCG/ACT nasal spray Place 2 sprays into both nostrils daily as needed for allergies. (Patient taking differently: Place 2 sprays into both nostrils 2 (two) times daily.)   levocetirizine (XYZAL) 5 MG tablet Take 1 tablet (5 mg total) by mouth daily as needed for allergies. (Patient taking differently: Take 5 mg by mouth daily.)   lisinopril (PRINIVIL,ZESTRIL) 10 MG tablet Take 10 mg by mouth daily.   Naproxen Sodium (ALEVE PO) Take by mouth as needed.   pantoprazole (PROTONIX) 40 MG tablet Take 1 tablet (40 mg total) by mouth 2 (two) times daily.   Phenylephrine-APAP-guaiFENesin (TYLENOL SINUS SEVERE) 5-325-200 MG TABS Take 2 tablets by mouth daily as needed (Headache).   PRESCRIPTION MEDICATION Allergy injections at the allergist office   zolpidem (AMBIEN) 10 MG tablet Take 10 mg by mouth at bedtime.     Allergies:   Codeine, Crestor [rosuvastatin], Lipitor [atorvastatin], and Zetia [ezetimibe]   Social History   Socioeconomic  History   Marital status: Divorced    Spouse name: Not on file   Number of children: Not on file   Years of education: Not on file   Highest education level: Not on file  Occupational History   Not on file  Tobacco Use   Smoking status: Never    Passive exposure: Never   Smokeless tobacco: Former    Types: Chew    Quit date: 06/03/2012  Vaping Use   Vaping status: Never Used  Substance and Sexual Activity   Alcohol use: Yes    Alcohol/week: 1.0 standard drink of alcohol    Types: 1 Cans of beer per week    Comment: Social   Drug use: Never   Sexual activity: Not on file  Other Topics Concern   Not on file  Social History Narrative   Lives at home with daughter.  Retired.  Education 12th grade.  Children 2.  Social Determinants of Health   Financial Resource Strain: Not on file  Food Insecurity: Not on file  Transportation Needs: Not on file  Physical Activity: Not on file  Stress: Not on file  Social Connections: Not on file     Family History: The patient's family history includes Aneurysm in his father; Asthma in his child. There is no history of Colon cancer.  ROS:   Please see the history of present illness.     All other systems reviewed and are negative.  EKGs/Labs/Other Studies Reviewed:    The following studies were reviewed today:   EKG:   05/29/21: Normal sinus rhythm, rate 79, Q-wave in III, no ST abnormalities 12/24/2022: Normal sinus rhythm, rate 75, Q waves in lead III, no ST abnormalities  Recent Labs: No results found for requested labs within last 365 days.  Recent Lipid Panel No results found for: "CHOL", "TRIG", "HDL", "CHOLHDL", "VLDL", "LDLCALC", "LDLDIRECT"  Physical Exam:    VS:  BP (!) 146/96 (BP Location: Right Arm, Patient Position: Sitting, Cuff Size: Normal)   Pulse 75   Ht 5\' 9"  (1.753 m)   Wt 166 lb 3.2 oz (75.4 kg)   SpO2 97%   BMI 24.54 kg/m     Wt Readings from Last 3 Encounters:  12/24/22 166 lb 3.2 oz (75.4 kg)   12/09/22 164 lb 6.4 oz (74.6 kg)  10/16/22 168 lb (76.2 kg)     GEN:  Well nourished, well developed in no acute distress HEENT: Normal NECK: No JVD; No carotid bruits LYMPHATICS: No lymphadenopathy CARDIAC: RRR, no murmurs, rubs, gallops RESPIRATORY:  Clear to auscultation without rales, wheezing or rhonchi  ABDOMEN: Soft, non-tender, non-distended MUSCULOSKELETAL:  No edema; No deformity  SKIN: Warm and dry NEUROLOGIC:  Alert and oriented x 3 PSYCHIATRIC:  Normal affect   ASSESSMENT:    1. Coronary artery disease involving native coronary artery of native heart without angina pectoris   2. Essential hypertension   3. Hyperlipidemia, unspecified hyperlipidemia type   4. Chest pain, unspecified type     PLAN:    Chest pain/DOE: Having atypical chest pain but does have significant CAD risk factors (hypertension, hyperlipidemia, family history).  Coronary CTA on 06/26/2021 showed mild CAD with calcified plaque in the proximal LAD causing 25 to 49% stenosis, calcium score 100 (48th percentile).  Suspect chest pain due to eosinophilic esophagitis, has improved with treating his esophagitis.  Hypertension: On lisinopril 10 mg daily.  Elevated in clinic today.  Asked him to check his BP twice daily for next week and let us know results  Hyperlipidemia: On Zetia 10 mg daily.  Unable to tolerate statins due to myalgias.  LDL 139 on 02/08/21.  Coronary CTA results as above.  Referred to pharmacy lipid clinic.  Started rosuvastatin 10 mg 3 times weekly 07/2021.  He reports was unable to tolerate due to myalgias.  LDL 95 on 09/30/2022 -Recommend follow-up in lipid clinic to consider alternatives given inability to tolerate statins  Family history of intracranial aneurysms: Reports both his brother and father died of intracranial aneurysm rupture.  Underwent CTA head to evaluate for aneurysm on 06/26/2021, showed no aneurysm.   RTC in 6 months   Medication Adjustments/Labs and Tests  Ordered: Current medicines are reviewed at length with the patient today.  Concerns regarding medicines are outlined above.  Orders Placed This Encounter  Procedures   AMB Referral to [facility] Pharm-D   EKG 12-Lead   No orders of the defined types  were placed in this encounter.   Patient Instructions  Medication Instructions:  Continue same medications *If you need a refill on your cardiac medications before your next appointment, please call your pharmacy*   Lab Work: None ordered   Testing/Procedures: None ordered   Follow-Up: At [facility], you and your health needs are our priority.  As part of our continuing mission to provide you with exceptional heart care, we have created designated Provider Care Teams.  These Care Teams include your primary Cardiologist (physician) and Advanced Practice Providers (APPs -  Physician Assistants and Nurse Practitioners) who all work together to provide you with the care you need, when you need it.  We recommend signing up for the patient portal called "MyChart".  Sign up information is provided on this After Visit Summary.  MyChart is used to connect with patients for Virtual Visits (Telemedicine).  Patients are able to view lab/test results, encounter notes, upcoming appointments, etc.  Non-urgent messages can be sent to your provider as well.   To learn more about what you can do with MyChart, go to [email or web address].    Your next appointment:  6 months    Call in Sept to schedule Jan appointment    Provider:  Dr.Tye Vigo    Schedule appointment with Pharmacist in Lipid Clinic  Check blood pressure twice a day for 1 week Call office to report readings     Signed, Little Ishikawa, MD  12/24/2022 1:08 PM    [facility]

## 2022-12-24 ENCOUNTER — Ambulatory Visit: Payer: Medicare Other | Attending: Cardiology | Admitting: Cardiology

## 2022-12-24 ENCOUNTER — Encounter: Payer: Self-pay | Admitting: Cardiology

## 2022-12-24 VITALS — BP 146/96 | HR 75 | Ht 69.0 in | Wt 166.2 lb

## 2022-12-24 DIAGNOSIS — R079 Chest pain, unspecified: Secondary | ICD-10-CM | POA: Insufficient documentation

## 2022-12-24 DIAGNOSIS — E785 Hyperlipidemia, unspecified: Secondary | ICD-10-CM | POA: Diagnosis not present

## 2022-12-24 DIAGNOSIS — I2510 Atherosclerotic heart disease of native coronary artery without angina pectoris: Secondary | ICD-10-CM | POA: Insufficient documentation

## 2022-12-24 DIAGNOSIS — I10 Essential (primary) hypertension: Secondary | ICD-10-CM | POA: Diagnosis not present

## 2022-12-24 NOTE — Patient Instructions (Signed)
Medication Instructions:  Continue same medications *If you need a refill on your cardiac medications before your next appointment, please call your pharmacy*   Lab Work: None ordered   Testing/Procedures: None ordered   Follow-Up: At [facility], you and your health needs are our priority.  As part of our continuing mission to provide you with exceptional heart care, we have created designated Provider Care Teams.  These Care Teams include your primary Cardiologist (physician) and Advanced Practice Providers (APPs -  Physician Assistants and Nurse Practitioners) who all work together to provide you with the care you need, when you need it.  We recommend signing up for the patient portal called "MyChart".  Sign up information is provided on this After Visit Summary.  MyChart is used to connect with patients for Virtual Visits (Telemedicine).  Patients are able to view lab/test results, encounter notes, upcoming appointments, etc.  Non-urgent messages can be sent to your provider as well.   To learn more about what you can do with MyChart, go to [email or web address].    Your next appointment:  6 months    Call in Sept to schedule Jan appointment    Provider:  Dr.Schumann    Schedule appointment with Pharmacist in Lipid Clinic  Check blood pressure twice a day for 1 week Call office to report readings

## 2022-12-26 DIAGNOSIS — H2512 Age-related nuclear cataract, left eye: Secondary | ICD-10-CM | POA: Diagnosis not present

## 2022-12-26 DIAGNOSIS — H43812 Vitreous degeneration, left eye: Secondary | ICD-10-CM | POA: Diagnosis not present

## 2022-12-26 DIAGNOSIS — H31091 Other chorioretinal scars, right eye: Secondary | ICD-10-CM | POA: Diagnosis not present

## 2023-01-01 ENCOUNTER — Ambulatory Visit (INDEPENDENT_AMBULATORY_CARE_PROVIDER_SITE_OTHER): Payer: Medicare Other

## 2023-01-01 DIAGNOSIS — J309 Allergic rhinitis, unspecified: Secondary | ICD-10-CM | POA: Diagnosis not present

## 2023-01-10 ENCOUNTER — Ambulatory Visit (INDEPENDENT_AMBULATORY_CARE_PROVIDER_SITE_OTHER): Payer: Medicare Other

## 2023-01-10 DIAGNOSIS — J309 Allergic rhinitis, unspecified: Secondary | ICD-10-CM

## 2023-01-15 ENCOUNTER — Telehealth: Payer: Self-pay | Admitting: Pharmacist Clinician (PhC)/ Clinical Pharmacy Specialist

## 2023-01-15 ENCOUNTER — Ambulatory Visit: Payer: Medicare Other | Admitting: Pharmacist Clinician (PhC)/ Clinical Pharmacy Specialist

## 2023-01-15 ENCOUNTER — Ambulatory Visit: Payer: Medicare Other

## 2023-01-15 DIAGNOSIS — I2510 Atherosclerotic heart disease of native coronary artery without angina pectoris: Secondary | ICD-10-CM

## 2023-01-15 DIAGNOSIS — I259 Chronic ischemic heart disease, unspecified: Secondary | ICD-10-CM

## 2023-01-15 DIAGNOSIS — M1711 Unilateral primary osteoarthritis, right knee: Secondary | ICD-10-CM | POA: Diagnosis not present

## 2023-01-15 NOTE — Progress Notes (Deleted)
Office Visit    Patient Name: Dennis Miles Date of Encounter: 01/15/2023  Primary Care Provider:  Benita Stabile, MD Primary Cardiologist:  None  Chief Complaint    Hyperlipidemia   Significant Past Medical History   CAD CAC = 100 (48th percentile), prox LAD 25-49% stenosis  HTN Elevated at last visit, on lisinopril              Allergies  Allergen Reactions   Codeine Nausea Only   Crestor [Rosuvastatin] Other (See Comments)    myalgias   Lipitor [Atorvastatin] Other (See Comments)    Myalgias    Zetia [Ezetimibe] Other (See Comments)    myalgias    History of Present Illness    Dennis Miles is a 72 y.o. male patient of Dr Bjorn Pippin, in the office today to discuss options for cholesterol management.  Insurance Carrier:  BCBS w/ separate PDP  LDL Cholesterol goal:  LDL < 70, TG < 150  Current Medications:     Previously tried:  atorvastatin, rosuvastatin  Family Hx:  mother had CABG in her 64's, brother w/ MI in his 10's, died of brain aneurysm in his 58's; father died brain aneurysm in his 93's  Social Hx: Tobacco: Alcohol:      Diet:      Exercise:   Adherence Assessment  Do you ever forget to take your medication? [] Yes [] No  Do you ever skip doses due to side effects? [] Yes [] No  Do you have trouble affording your medicines? [] Yes [] No  Are you ever unable to pick up your medication due to transportation difficulties? [] Yes [] No  Do you ever stop taking your medications because you don't believe they are helping? [] Yes [] No  Do you check your weight daily? [] Yes [] No   Adherence strategy: ***  Barriers to obtaining medications: ***     Accessory Clinical Findings   4/24  TC 226, TG 527, HDL 43, LDL 95  No results found for: "LIPOA"  Lab Results  Component Value Date   ALT 18 10/30/2021   AST 24 10/30/2021   ALKPHOS 71 09/26/2016   BILITOT 0.5 10/30/2021   Lab Results  Component Value Date   CREATININE 1.00 10/30/2021    BUN 7 10/30/2021   NA 139 10/30/2021   K 4.4 10/30/2021   CL 100 10/30/2021   CO2 28 10/30/2021   No results found for: "HGBA1C"  Home Medications    Current Outpatient Medications  Medication Sig Dispense Refill   allopurinol (ZYLOPRIM) 300 MG tablet Take 1 tablet every day by oral route.     Coenzyme Q10 (CO Q 10) 100 MG CAPS Take 100 mg by mouth daily.     cyclobenzaprine (FLEXERIL) 5 MG tablet Take 5 mg by mouth daily as needed for muscle spasms (pinched nerve).     EPINEPHRINE 0.3 mg/0.3 mL IJ SOAJ injection Inject 0.3 mg into the muscle as needed for anaphylaxis. 2 each 1   ezetimibe (ZETIA) 10 MG tablet Take 10 mg by mouth daily.     fluticasone (FLONASE) 50 MCG/ACT nasal spray Place 2 sprays into both nostrils daily as needed for allergies. (Patient taking differently: Place 2 sprays into both nostrils 2 (two) times daily.) 16 g 5   levocetirizine (XYZAL) 5 MG tablet Take 1 tablet (5 mg total) by mouth daily as needed for allergies. (Patient taking differently: Take 5 mg by mouth daily.) 60 tablet 5   lisinopril (PRINIVIL,ZESTRIL) 10 MG tablet Take 10 mg  by mouth daily.     Naproxen Sodium (ALEVE PO) Take by mouth as needed.     pantoprazole (PROTONIX) 40 MG tablet Take 1 tablet (40 mg total) by mouth 2 (two) times daily. 180 tablet 1   Phenylephrine-APAP-guaiFENesin (TYLENOL SINUS SEVERE) 5-325-200 MG TABS Take 2 tablets by mouth daily as needed (Headache).     PRESCRIPTION MEDICATION Allergy injections at the allergist office     zolpidem (AMBIEN) 10 MG tablet Take 10 mg by mouth at bedtime.     No current facility-administered medications for this visit.     Assessment & Plan    No problem-specific Assessment & Plan notes found for this encounter.   Phillips Hay, PharmD CPP [facility] [address]  [place], [place] [address] [phone]  01/15/2023, 12:29 PM

## 2023-01-15 NOTE — Telephone Encounter (Signed)
Patient scheduled for PharmD OV to discuss lipid lowering options.  His PCP started process to get Leqvio covered and he has already been contacted by the Infusion center to schedule appointment.  Cancelled today appointment with me.  He did bring some home BP readings with him.  Since August 1, his BP average has been 127/81 (16 readings).  In office today BP was 110/73.  Advised that he make no changes to his medications and I would share the information with Dr. Bjorn Pippin.

## 2023-01-16 ENCOUNTER — Telehealth: Payer: Self-pay

## 2023-01-16 ENCOUNTER — Encounter: Payer: Self-pay | Admitting: Internal Medicine

## 2023-01-16 DIAGNOSIS — I259 Chronic ischemic heart disease, unspecified: Secondary | ICD-10-CM | POA: Insufficient documentation

## 2023-01-16 DIAGNOSIS — I2510 Atherosclerotic heart disease of native coronary artery without angina pectoris: Secondary | ICD-10-CM | POA: Insufficient documentation

## 2023-01-16 NOTE — Telephone Encounter (Signed)
Auth Submission: NO AUTH NEEDED Site of care: Site of care: [facility] Payer: Medicare B plus BCBS supplement Medication & CPT/J Code(s) submitted: Leqvio (Inclisiran) J1306 Units/visits requested: 284mg  x 2 Approval from: 01/16/23 to 06/03/23

## 2023-01-17 NOTE — Progress Notes (Signed)
Office Visit Note  Patient: Dennis Miles             Date of Birth: 06-02-1951           MRN: [id number]             PCP: Benita Stabile, MD Referring: Benita Stabile, MD Visit Date: 01/31/2023 Occupation: @GUAROCC @  Subjective:  Right knee pain and pain in feet.  History of Present Illness: Dennis Miles is a 72 y.o. male with gout and osteoarthritis.  He states he has not had a gout flare but he continues to have pain and swelling in his right knee.  He was seen at [facility] and had right knee cortisone injection.  He noted some improvement in his right knee after the cortisone injection.  He states he continues to have pain and discomfort in his right foot which she describes over the ball of his feet.  He states his right second toe does not extend anymore.  None of the other joints are painful or swollen.    Activities of Daily Living:  Patient reports morning stiffness for 0 minutes.   Patient Reports nocturnal pain.  Difficulty dressing/grooming: Denies Difficulty climbing stairs: Denies Difficulty getting out of chair: Denies Difficulty using hands for taps, buttons, cutlery, and/or writing: Denies  Review of Systems  Constitutional:  Positive for fatigue.  HENT:  Positive for mouth dryness. Negative for mouth sores.   Eyes:  Negative for dryness.  Respiratory:  Negative for shortness of breath.   Cardiovascular:  Negative for chest pain and palpitations.  Gastrointestinal:  Negative for blood in stool, constipation and diarrhea.  Endocrine: Negative for increased urination.  Genitourinary:  Negative for involuntary urination.  Musculoskeletal:  Positive for joint pain, joint pain, myalgias, muscle tenderness and myalgias. Negative for gait problem, joint swelling, muscle weakness and morning stiffness.  Skin:  Positive for sensitivity to sunlight. Negative for color change and rash.  Allergic/Immunologic: Negative for susceptible to infections.  Neurological:   Negative for dizziness, numbness and headaches.  Hematological:  Negative for swollen glands.  Psychiatric/Behavioral:  Positive for sleep disturbance. Negative for depressed mood. The patient is not nervous/anxious.     PMFS History:  Patient Active Problem List   Diagnosis Date Noted   Ischemic heart disease 01/16/2023   ASCVD (arteriosclerotic cardiovascular disease) 01/16/2023   History of colonic polyps 08/15/2022   S/P arthroscopy of right knee 12/25/2021   Acute lateral meniscus tear of right knee 11/21/2021   Seasonal and perennial allergic rhinitis 10/26/2021   Eosinophilic esophagitis 07/17/2021   Esophageal dysphagia 05/14/2021   Abdominal pain, epigastric 05/14/2021   IBS (irritable bowel syndrome) 11/13/2020   Primary insomnia 09/26/2016   DJD (degenerative joint disease), cervical 09/26/2016   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   Abdominal pain, left lower quadrant 10/19/2012   Gastroesophageal reflux disease 04/15/2012   Gout 04/01/2011   Hypertension 04/01/2011   Elevated cholesterol with high triglycerides 04/01/2011    Past Medical History:  Diagnosis Date   Anxiety    GERD (gastroesophageal reflux disease)    Gout    Headache(784.0)    Hiatal hernia    High cholesterol    Hypertension    Skin cancer     Family History  Problem Relation Age of Onset   Aneurysm Father    Asthma Child    Colon cancer Neg Hx    Past Surgical History:  Procedure Laterality Date  APPENDECTOMY     BALLOON DILATION  09/12/2022   Procedure: BALLOON DILATION;  Surgeon: Marguerita Merles, Reuel Boom, MD;  Location: [facility];  Service: Gastroenterology;;   BIOPSY  05/30/2021   Procedure: BIOPSY;  Surgeon: Malissa Hippo, MD;  [facility]: [facility];  Service: Endoscopy;;   BIOPSY  09/12/2022   Procedure: BIOPSY;  Surgeon: Marguerita Merles, Reuel Boom, MD;  [facility]: [facility];  Service: Gastroenterology;;   CATARACT EXTRACTION Right     retinal procedure done at same time per patient   COLONOSCOPY N/A 10/23/2012   Procedure: COLONOSCOPY;  Surgeon: Malissa Hippo, MD;  [facility]: [facility];  Service: Endoscopy;  Laterality: N/A;  235   COLONOSCOPY WITH PROPOFOL N/A 09/12/2022   Procedure: COLONOSCOPY WITH PROPOFOL;  Surgeon: Dolores Frame, MD;  [facility]: [facility];  Service: Gastroenterology;  Laterality: N/A;  12:30PM; ASA 1   ESOPHAGEAL DILATION N/A 05/30/2021   Procedure: ESOPHAGEAL DILATION;  Surgeon: Malissa Hippo, MD;  [facility];  Service: Endoscopy;  Laterality: N/A;   ESOPHAGOGASTRODUODENOSCOPY  04/17/2011   Procedure: ESOPHAGOGASTRODUODENOSCOPY (EGD);  Surgeon: Malissa Hippo, MD;  [facility];  Service: Endoscopy;  Laterality: N/A;  8:30   ESOPHAGOGASTRODUODENOSCOPY (EGD) WITH PROPOFOL N/A 05/30/2021   Procedure: ESOPHAGOGASTRODUODENOSCOPY (EGD) WITH PROPOFOL;  Surgeon: Malissa Hippo, MD;  [facility];  Service: Endoscopy;  Laterality: N/A;  7:30   ESOPHAGOGASTRODUODENOSCOPY (EGD) WITH PROPOFOL N/A 09/12/2022   Procedure: ESOPHAGOGASTRODUODENOSCOPY (EGD) WITH PROPOFOL;  Surgeon: Dolores Frame, MD;  [facility];  Service: Gastroenterology;  Laterality: N/A;  12:30PM;ASA 1   EYE SURGERY     HAND SURGERY  05/28/2016   INGUINAL HERNIA REPAIR Left 02/17/2013   Procedure: HERNIA REPAIR INGUINAL ADULT;  Surgeon: Dalia Heading, MD;  Location: [facility];  Service: General;  Laterality: Left;   INSERTION OF MESH Left 02/17/2013   Procedure: INSERTION OF MESH;  Surgeon: Dalia Heading, MD;  Location: [facility];  Service: General;  Laterality: Left;   KNEE ARTHROSCOPY W/ MENISCAL REPAIR Right 11/2021   POLYPECTOMY  09/12/2022   Procedure: POLYPECTOMY INTESTINAL;  Surgeon: Dolores Frame, MD;  [facility];  Service: Gastroenterology;;   SINOSCOPY     sinus sugery     SKIN CANCER EXCISION     x4   SQUAMOUS CELL CARCINOMA  EXCISION Left 2023   forearm   Social History   Social History Narrative   Lives at home with daughter.  Retired.  Education 12th grade.  Children 2.     Immunization History  Administered Date(s) Administered   PFIZER(Purple Top)SARS-COV-2 Vaccination 08/02/2019, 08/23/2019, 06/22/2020     Objective: Vital Signs: BP 128/79 (BP Location: Left Arm, Patient Position: Sitting, Cuff Size: Normal)   Pulse 69   Resp 16   Ht 5\' 9"  (1.753 m)   Wt 162 lb 6.4 oz (73.7 kg)   BMI 23.98 kg/m    Physical Exam Vitals and nursing note reviewed.  Constitutional:      Appearance: He is well-developed.  HENT:     Head: Normocephalic and atraumatic.  Eyes:     Conjunctiva/sclera: Conjunctivae normal.     Pupils: Pupils are equal, round, and reactive to light.  Cardiovascular:     Rate and Rhythm: Normal rate and regular rhythm.     Heart sounds: Normal heart sounds.  Pulmonary:     Effort: Pulmonary effort is normal.     Breath sounds:  Normal breath sounds.  Abdominal:     General: Bowel sounds are normal.     Palpations: Abdomen is soft.  Musculoskeletal:     Cervical back: Normal range of motion and neck supple.  Skin:    General: Skin is warm and dry.     Capillary Refill: Capillary refill takes less than 2 seconds.  Neurological:     Mental Status: He is alert and oriented to person, place, and time.  Psychiatric:        Behavior: Behavior normal.      Musculoskeletal Exam: He had limited lateral rotation without much discomfort.  Shoulder joints, elbow joints, wrist joints, MCPs PIPs and DIPs were in good range of motion.  PIP and DIP thickening with no synovitis was noted.  Hip joints were in good range of motion.  Knee joints were in good range of motion.  No warmth swelling or effusion was noted in his right knee joint.  He had right second hammertoe.  No swelling was noted.  CDAI Exam: CDAI Score: -- Patient Global: --; Provider Global: -- Swollen: --; Tender: -- Joint  Exam 01/31/2023   No joint exam has been documented for this visit   There is currently no information documented on the homunculus. Go to the Rheumatology activity and complete the homunculus joint exam.  Investigation: No additional findings.  Imaging: No results found.  Recent Labs: Lab Results  Component Value Date   WBC 6.2 10/30/2021   HGB 13.8 10/30/2021   PLT 211 10/30/2021   NA 139 10/30/2021   K 4.4 10/30/2021   CL 100 10/30/2021   CO2 28 10/30/2021   GLUCOSE 121 (H) 10/30/2021   BUN 7 10/30/2021   CREATININE 1.00 10/30/2021   BILITOT 0.5 10/30/2021   ALKPHOS 71 09/26/2016   AST 24 10/30/2021   ALT 18 10/30/2021   PROT 6.6 10/30/2021   ALBUMIN 4.2 09/26/2016   CALCIUM 9.1 10/30/2021   GFRAA 75 07/11/2020    Speciality Comments: No specialty comments available.  Procedures:  No procedures performed Allergies: Codeine, Crestor [rosuvastatin], Lipitor [atorvastatin], and Zetia [ezetimibe]   Assessment / Plan:     Visit Diagnoses: Idiopathic chronic gout of multiple sites with tophus -patient denies having a flare of gout.  Although he continues to have recurrent swelling in his right knee joint.  He had cortisone injection this month by no synovitis was noted on the examination.  My goal is to keep his uric acid below 6.0.  I discussed with the patient to increase the dose of allopurinol to 1-1/2 tablet daily.  Side effects of allopurinol were revised.  He will increase allopurinol 300 mg, 1-1/2 tablet p.o. on a daily basis. uric acid: 6.1 on 09/30/2022.  At the time and CMP were normal.  I advised him to have repeat labs with his PCP next month at his appointment.  Medication management-patient was advised to get CBC with differential, CMP with GFR and uric acid with Dr. Margo Aye next month at his follow-up visit.  Effusion, right knee-resolved.  He continues to have some thickening of the right knee joint.  No warmth swelling or effusion was noted today on the  examination.  Patient had a cortisone injection this month.  Chronic pain of right knee - He underwent right knee arthroscopic surgery in May 2023 performed by Dr. Roda Shutters.  Patient states he had no relief in his knee joint symptoms.  Previous x-rays and MRI were reviewed.  He had degenerative meniscus.  Primary osteoarthritis of both feet-he continues to have some discomfort in his feet.  Hammer toes, bilateral-he is concerned about the right second hammertoe.  I will refer him to podiatrist.  He may benefit from metatarsal pads and a contraption.  DDD (degenerative disc disease), cervical-continue to have some stiffness in his cervical spine.  Range of motion exercises were advised.  Primary insomnia - Ambien 10 mg p.o. nightly as needed for insomnia.  History of hyperlipidemia  History of gastroesophageal reflux (GERD)  History of hypertension-blood pressure was normal today at 128/79.    Orders: Orders Placed This Encounter  Procedures   Ambulatory referral to Podiatry   Meds ordered this encounter  Medications   allopurinol (ZYLOPRIM) 300 MG tablet    Sig: Take 1.5 tablets (450 mg total) by mouth daily.     Follow-Up Instructions: Return in about 3 months (around 05/03/2023) for Gout, Osteoarthritis.   Pollyann Savoy, MD  Note - This record has been created using Animal nutritionist.  Chart creation errors have been sought, but may not always  have been located. Such creation errors do not reflect on  the standard of medical care.

## 2023-01-22 ENCOUNTER — Encounter: Payer: Medicare Other | Attending: Internal Medicine | Admitting: *Deleted

## 2023-01-22 ENCOUNTER — Ambulatory Visit: Payer: Medicare Other

## 2023-01-22 VITALS — BP 158/91 | HR 80 | Temp 97.7°F | Resp 18

## 2023-01-22 DIAGNOSIS — I2510 Atherosclerotic heart disease of native coronary artery without angina pectoris: Secondary | ICD-10-CM

## 2023-01-22 DIAGNOSIS — E782 Mixed hyperlipidemia: Secondary | ICD-10-CM

## 2023-01-22 DIAGNOSIS — I259 Chronic ischemic heart disease, unspecified: Secondary | ICD-10-CM | POA: Insufficient documentation

## 2023-01-22 MED ORDER — INCLISIRAN SODIUM 284 MG/1.5ML ~~LOC~~ SOSY
284.0000 mg | PREFILLED_SYRINGE | Freq: Once | SUBCUTANEOUS | Status: AC
  Administered 2023-01-22: 284 mg via SUBCUTANEOUS

## 2023-01-22 NOTE — Progress Notes (Signed)
Diagnosis: Hypercholesterima  Provider:  Dwana Melena MD  Procedure: Injection  Leqvio (inclisiran), Dose: 284 mg, Site: subcutaneous, Number of injections: 1  Post Care: Observation period completed  Discharge: Condition: Good, Destination: Home . AVS Provided  Performed by:  Daleen Squibb, RN

## 2023-01-24 ENCOUNTER — Ambulatory Visit (INDEPENDENT_AMBULATORY_CARE_PROVIDER_SITE_OTHER): Payer: Medicare Other

## 2023-01-24 DIAGNOSIS — J309 Allergic rhinitis, unspecified: Secondary | ICD-10-CM

## 2023-01-31 ENCOUNTER — Encounter: Payer: Medicare Other | Admitting: Rheumatology

## 2023-01-31 ENCOUNTER — Encounter: Payer: Self-pay | Admitting: Rheumatology

## 2023-01-31 VITALS — BP 128/79 | HR 69 | Resp 16 | Ht 69.0 in | Wt 162.4 lb

## 2023-01-31 DIAGNOSIS — M25561 Pain in right knee: Secondary | ICD-10-CM

## 2023-01-31 DIAGNOSIS — F5101 Primary insomnia: Secondary | ICD-10-CM

## 2023-01-31 DIAGNOSIS — Z79899 Other long term (current) drug therapy: Secondary | ICD-10-CM

## 2023-01-31 DIAGNOSIS — M19072 Primary osteoarthritis, left ankle and foot: Secondary | ICD-10-CM

## 2023-01-31 DIAGNOSIS — G8929 Other chronic pain: Secondary | ICD-10-CM | POA: Diagnosis not present

## 2023-01-31 DIAGNOSIS — M5030 Other cervical disc degeneration, unspecified cervical region: Secondary | ICD-10-CM | POA: Diagnosis not present

## 2023-01-31 DIAGNOSIS — Z8719 Personal history of other diseases of the digestive system: Secondary | ICD-10-CM | POA: Diagnosis not present

## 2023-01-31 DIAGNOSIS — Z8639 Personal history of other endocrine, nutritional and metabolic disease: Secondary | ICD-10-CM | POA: Diagnosis not present

## 2023-01-31 DIAGNOSIS — M19071 Primary osteoarthritis, right ankle and foot: Secondary | ICD-10-CM

## 2023-01-31 DIAGNOSIS — M25461 Effusion, right knee: Secondary | ICD-10-CM | POA: Diagnosis not present

## 2023-01-31 DIAGNOSIS — I259 Chronic ischemic heart disease, unspecified: Secondary | ICD-10-CM | POA: Diagnosis not present

## 2023-01-31 DIAGNOSIS — M2042 Other hammer toe(s) (acquired), left foot: Secondary | ICD-10-CM

## 2023-01-31 DIAGNOSIS — Z8679 Personal history of other diseases of the circulatory system: Secondary | ICD-10-CM

## 2023-01-31 DIAGNOSIS — M2041 Other hammer toe(s) (acquired), right foot: Secondary | ICD-10-CM

## 2023-01-31 DIAGNOSIS — M1A09X1 Idiopathic chronic gout, multiple sites, with tophus (tophi): Secondary | ICD-10-CM

## 2023-01-31 MED ORDER — ALLOPURINOL 300 MG PO TABS
450.0000 mg | ORAL_TABLET | Freq: Every day | ORAL | Status: DC
Start: 2023-01-31 — End: 2023-05-08

## 2023-01-31 NOTE — Patient Instructions (Signed)
Please increase the dose of allopurinol 300 mg tablet, 1-1/2 tablet by mouth daily.  Please get labs with your PCP in 1 month which should include CBC with differential, CMP with GFR and uric acid.

## 2023-02-05 ENCOUNTER — Ambulatory Visit (INDEPENDENT_AMBULATORY_CARE_PROVIDER_SITE_OTHER): Payer: Medicare Other

## 2023-02-05 DIAGNOSIS — J309 Allergic rhinitis, unspecified: Secondary | ICD-10-CM | POA: Diagnosis not present

## 2023-02-14 ENCOUNTER — Ambulatory Visit (INDEPENDENT_AMBULATORY_CARE_PROVIDER_SITE_OTHER): Payer: Medicare Other

## 2023-02-14 DIAGNOSIS — J309 Allergic rhinitis, unspecified: Secondary | ICD-10-CM

## 2023-02-18 ENCOUNTER — Ambulatory Visit (INDEPENDENT_AMBULATORY_CARE_PROVIDER_SITE_OTHER): Payer: Medicare Other

## 2023-02-18 ENCOUNTER — Ambulatory Visit (INDEPENDENT_AMBULATORY_CARE_PROVIDER_SITE_OTHER): Payer: Medicare Other | Admitting: Podiatry

## 2023-02-18 DIAGNOSIS — M2042 Other hammer toe(s) (acquired), left foot: Secondary | ICD-10-CM | POA: Diagnosis not present

## 2023-02-18 DIAGNOSIS — M7741 Metatarsalgia, right foot: Secondary | ICD-10-CM

## 2023-02-18 DIAGNOSIS — M2041 Other hammer toe(s) (acquired), right foot: Secondary | ICD-10-CM | POA: Diagnosis not present

## 2023-02-18 DIAGNOSIS — M7742 Metatarsalgia, left foot: Secondary | ICD-10-CM | POA: Diagnosis not present

## 2023-02-18 NOTE — Progress Notes (Signed)
Subjective:  Patient ID: Dennis Miles, male    DOB: [birth year],  MRN: [id number]  Chief Complaint  Patient presents with   Hammer Toe    Pt presents today for hammertoes on both feet.    72 y.o. male presents with the above complaint. History confirmed with patient.  The right second toe stays bent and crooked, the left one is not as bad he does have pain noted to the right ball of the foot  Objective:  Physical Exam: warm, good capillary refill, no trophic changes or ulcerative lesions, normal DP and PT pulses, normal sensory exam, and bilaterally he has prominence of the second metatarsal with tenderness on the right on the plantar MTPJ, he has a nonreducible hammertoe deformity of the right second toe, reducible hammertoe deformity of the left second toe.  Radiographs: Multiple views x-ray of both feet: no fracture, dislocation, swelling or degenerative changes noted and elongated second metatarsal Assessment:   1. Hammertoe of right foot   2. Hammer toe of left foot   3. Metatarsalgia of both feet      Plan:  Patient was evaluated and treated and all questions answered.  Hammertoe and Metatarsalgia -XR reviewed with patient -Educated on etiology of deformity -Discussed padding and shoe gear changes -Discussed with patient that they would benefit from surgical intervention after having failed all conservative therapy. -The majority of his pain is not in the toe itself but rather in the second MTP on the right foot.  I recommended offloading with a custom molded foot orthosis with an unload in the second metatarsal and a metatarsal pad.  Due to severity and significance of the deformity do not think that a prefabricated orthosis would offer a similar amount of relief.  He will be fitted for these.  We will try to make these so they fit in the boots that he typically wears  Return if symptoms worsen or fail to improve.

## 2023-02-21 ENCOUNTER — Ambulatory Visit (INDEPENDENT_AMBULATORY_CARE_PROVIDER_SITE_OTHER): Payer: Self-pay

## 2023-02-21 DIAGNOSIS — J309 Allergic rhinitis, unspecified: Secondary | ICD-10-CM

## 2023-02-25 ENCOUNTER — Encounter (INDEPENDENT_AMBULATORY_CARE_PROVIDER_SITE_OTHER): Payer: Self-pay | Admitting: *Deleted

## 2023-02-26 DIAGNOSIS — H35371 Puckering of macula, right eye: Secondary | ICD-10-CM | POA: Diagnosis not present

## 2023-03-05 ENCOUNTER — Ambulatory Visit (INDEPENDENT_AMBULATORY_CARE_PROVIDER_SITE_OTHER): Payer: Medicare Other

## 2023-03-05 DIAGNOSIS — M7742 Metatarsalgia, left foot: Secondary | ICD-10-CM | POA: Diagnosis not present

## 2023-03-05 DIAGNOSIS — M7741 Metatarsalgia, right foot: Secondary | ICD-10-CM

## 2023-03-05 DIAGNOSIS — M2041 Other hammer toe(s) (acquired), right foot: Secondary | ICD-10-CM

## 2023-03-05 DIAGNOSIS — M2042 Other hammer toe(s) (acquired), left foot: Secondary | ICD-10-CM

## 2023-03-05 NOTE — Progress Notes (Signed)
  Patient was seen, measured / scanned for custom molded foot orthotics. Metatarsalgia and hammertoes present offloads to 2nd MT heads into Xtra FF padding and added MT pads  Patient will benefit from CFO's as they will help provide total contact to MLA's helping to better distribute body weight across BIL feet greater reducing plantar pressure and pain and to also encourage FF and RF alignment.  Patient was scanned items to be ordered and fit when in  [employer], CFo, CFm

## 2023-03-07 ENCOUNTER — Ambulatory Visit (INDEPENDENT_AMBULATORY_CARE_PROVIDER_SITE_OTHER): Payer: Medicare Other

## 2023-03-07 DIAGNOSIS — J309 Allergic rhinitis, unspecified: Secondary | ICD-10-CM | POA: Diagnosis not present

## 2023-03-13 DIAGNOSIS — L820 Inflamed seborrheic keratosis: Secondary | ICD-10-CM | POA: Diagnosis not present

## 2023-03-13 DIAGNOSIS — D485 Neoplasm of uncertain behavior of skin: Secondary | ICD-10-CM | POA: Diagnosis not present

## 2023-03-13 DIAGNOSIS — L570 Actinic keratosis: Secondary | ICD-10-CM | POA: Diagnosis not present

## 2023-03-13 DIAGNOSIS — L821 Other seborrheic keratosis: Secondary | ICD-10-CM | POA: Diagnosis not present

## 2023-03-24 ENCOUNTER — Encounter (INDEPENDENT_AMBULATORY_CARE_PROVIDER_SITE_OTHER): Payer: Self-pay

## 2023-03-25 ENCOUNTER — Encounter (INDEPENDENT_AMBULATORY_CARE_PROVIDER_SITE_OTHER): Payer: Self-pay

## 2023-03-26 ENCOUNTER — Ambulatory Visit (INDEPENDENT_AMBULATORY_CARE_PROVIDER_SITE_OTHER): Payer: Medicare Other

## 2023-03-26 DIAGNOSIS — J309 Allergic rhinitis, unspecified: Secondary | ICD-10-CM

## 2023-03-31 ENCOUNTER — Telehealth: Payer: Self-pay

## 2023-03-31 NOTE — Telephone Encounter (Signed)
Pt states that he was advised to call back in 3 weeks to get his orthotics.. hasn't heard anything back and would like a call by the end of the week before cancellation of the order entirely.

## 2023-04-01 DIAGNOSIS — E782 Mixed hyperlipidemia: Secondary | ICD-10-CM | POA: Diagnosis not present

## 2023-04-01 DIAGNOSIS — R7301 Impaired fasting glucose: Secondary | ICD-10-CM | POA: Diagnosis not present

## 2023-04-02 LAB — LAB REPORT - SCANNED
A1c: 5.4
Albumin, Urine POC: 3.1
Creatinine, POC: 126.8 mg/dL
EGFR: 75
Microalb Creat Ratio: 2

## 2023-04-07 DIAGNOSIS — J3081 Allergic rhinitis due to animal (cat) (dog) hair and dander: Secondary | ICD-10-CM | POA: Diagnosis not present

## 2023-04-07 NOTE — Progress Notes (Signed)
EXP 04/06/24

## 2023-04-08 DIAGNOSIS — G5600 Carpal tunnel syndrome, unspecified upper limb: Secondary | ICD-10-CM | POA: Diagnosis not present

## 2023-04-08 DIAGNOSIS — G248 Other dystonia: Secondary | ICD-10-CM | POA: Diagnosis not present

## 2023-04-08 DIAGNOSIS — J309 Allergic rhinitis, unspecified: Secondary | ICD-10-CM | POA: Diagnosis not present

## 2023-04-08 DIAGNOSIS — K2100 Gastro-esophageal reflux disease with esophagitis, without bleeding: Secondary | ICD-10-CM | POA: Diagnosis not present

## 2023-04-08 DIAGNOSIS — I10 Essential (primary) hypertension: Secondary | ICD-10-CM | POA: Diagnosis not present

## 2023-04-08 DIAGNOSIS — I2510 Atherosclerotic heart disease of native coronary artery without angina pectoris: Secondary | ICD-10-CM | POA: Diagnosis not present

## 2023-04-08 DIAGNOSIS — M1A00X1 Idiopathic chronic gout, unspecified site, with tophus (tophi): Secondary | ICD-10-CM | POA: Diagnosis not present

## 2023-04-08 DIAGNOSIS — G4700 Insomnia, unspecified: Secondary | ICD-10-CM | POA: Diagnosis not present

## 2023-04-08 DIAGNOSIS — R7301 Impaired fasting glucose: Secondary | ICD-10-CM | POA: Diagnosis not present

## 2023-04-08 DIAGNOSIS — J302 Other seasonal allergic rhinitis: Secondary | ICD-10-CM | POA: Diagnosis not present

## 2023-04-08 DIAGNOSIS — Z23 Encounter for immunization: Secondary | ICD-10-CM | POA: Diagnosis not present

## 2023-04-08 DIAGNOSIS — E782 Mixed hyperlipidemia: Secondary | ICD-10-CM | POA: Diagnosis not present

## 2023-04-08 DIAGNOSIS — E875 Hyperkalemia: Secondary | ICD-10-CM | POA: Diagnosis not present

## 2023-04-09 ENCOUNTER — Telehealth: Payer: Self-pay | Admitting: *Deleted

## 2023-04-09 NOTE — Telephone Encounter (Signed)
Labs received from:Dr. Dwana Melena  Drawn on:04/01/2023  Reviewed by:Sherron Ales, PA-C  Labs drawn: Uric Acid, Hgb A1C, Albumin/Creatinine Ratio, Urine, Lipid Panel, CMP, CBC  Results: Uric Acid 3.0     Triglycerides 285    VLDL Cholesterol Cal 44    Glucose 100    BUN 6    BUN/Creat. Ratio 6    MCV 104    MCH 34.8  Patient is on Allopurinol 150 mg po daily.

## 2023-04-10 ENCOUNTER — Encounter (HOSPITAL_COMMUNITY)
Admission: RE | Admit: 2023-04-10 | Discharge: 2023-04-10 | Disposition: A | Discharge status: Home / Self Care | Payer: Medicare Other | Source: Ambulatory Visit | Attending: Gastroenterology | Admitting: Gastroenterology

## 2023-04-11 ENCOUNTER — Ambulatory Visit (INDEPENDENT_AMBULATORY_CARE_PROVIDER_SITE_OTHER): Payer: Medicare Other

## 2023-04-11 DIAGNOSIS — J309 Allergic rhinitis, unspecified: Secondary | ICD-10-CM | POA: Diagnosis not present

## 2023-04-14 ENCOUNTER — Encounter (HOSPITAL_COMMUNITY)
Admission: RE | Disposition: A | Discharge status: Home / Self Care | Payer: Self-pay | Source: Home / Self Care | Attending: Gastroenterology

## 2023-04-14 ENCOUNTER — Ambulatory Visit (HOSPITAL_COMMUNITY): Payer: Medicare Other | Admitting: Anesthesiology

## 2023-04-14 ENCOUNTER — Ambulatory Visit (HOSPITAL_COMMUNITY)
Admission: RE | Admit: 2023-04-14 | Discharge: 2023-04-14 | Disposition: A | Discharge status: Home / Self Care | Payer: Medicare Other | Attending: Gastroenterology | Admitting: Gastroenterology

## 2023-04-14 DIAGNOSIS — K219 Gastro-esophageal reflux disease without esophagitis: Secondary | ICD-10-CM | POA: Insufficient documentation

## 2023-04-14 DIAGNOSIS — K2289 Other specified disease of esophagus: Secondary | ICD-10-CM | POA: Diagnosis not present

## 2023-04-14 DIAGNOSIS — K449 Diaphragmatic hernia without obstruction or gangrene: Secondary | ICD-10-CM | POA: Diagnosis not present

## 2023-04-14 DIAGNOSIS — I10 Essential (primary) hypertension: Secondary | ICD-10-CM | POA: Diagnosis not present

## 2023-04-14 DIAGNOSIS — F419 Anxiety disorder, unspecified: Secondary | ICD-10-CM | POA: Diagnosis not present

## 2023-04-14 DIAGNOSIS — K200 Eosinophilic esophagitis: Secondary | ICD-10-CM | POA: Diagnosis not present

## 2023-04-14 DIAGNOSIS — K222 Esophageal obstruction: Secondary | ICD-10-CM | POA: Diagnosis not present

## 2023-04-14 DIAGNOSIS — R1310 Dysphagia, unspecified: Secondary | ICD-10-CM | POA: Diagnosis not present

## 2023-04-14 DIAGNOSIS — Z87891 Personal history of nicotine dependence: Secondary | ICD-10-CM | POA: Insufficient documentation

## 2023-04-14 HISTORY — PX: BIOPSY: SHX5522

## 2023-04-14 HISTORY — PX: ESOPHAGOGASTRODUODENOSCOPY (EGD) WITH PROPOFOL: SHX5813

## 2023-04-14 SURGERY — ESOPHAGOGASTRODUODENOSCOPY (EGD) WITH PROPOFOL
Anesthesia: General

## 2023-04-14 MED ORDER — LACTATED RINGERS IV SOLN: INTRAVENOUS | Status: DC | PRN

## 2023-04-14 MED ORDER — LIDOCAINE 2% (20 MG/ML) 5 ML SYRINGE
INTRAMUSCULAR | Status: DC | PRN
  Administered 2023-04-14: 100 mg via INTRAVENOUS

## 2023-04-14 MED ORDER — PROPOFOL 10 MG/ML IV BOLUS
INTRAVENOUS | Status: AC
  Filled 2023-04-14: qty 20

## 2023-04-14 MED ORDER — PROPOFOL 10 MG/ML IV BOLUS
INTRAVENOUS | Status: DC | PRN
  Administered 2023-04-14: 100 mg via INTRAVENOUS
  Administered 2023-04-14 (×3): 50 mg via INTRAVENOUS

## 2023-04-14 MED ORDER — LIDOCAINE HCL (PF) 2 % IJ SOLN
INTRAMUSCULAR | Status: AC
  Filled 2023-04-14: qty 5

## 2023-04-14 NOTE — H&P (Signed)
Dennis Miles is an 72 y.o. male.   Chief Complaint: Follow-up of GERD and eosinophilic esophagitis HPI: Dennis Miles is a 72 y.o. male with PMH GERD, EoE,  anxiety, HTN, gout, skin cancer, who presents for follow up of EoE and GERD.   Patient reports feeling well.  Endorses having some mild dysphagia when swallowing pills but no odynophagia or dysphagia to solids.  No heartburn.  Taking PPI twice daily compliantly.  Past Medical History:  Diagnosis Date   Anxiety    GERD (gastroesophageal reflux disease)    Gout    Headache(784.0)    Hiatal hernia    High cholesterol    Hypertension    Skin cancer     Past Surgical History:  Procedure Laterality Date   APPENDECTOMY     BALLOON DILATION  09/12/2022   Procedure: BALLOON DILATION;  Surgeon: Dolores Frame, MD;  Location: [facility];  Service: Gastroenterology;;   BIOPSY  05/30/2021   Procedure: BIOPSY;  Surgeon: Malissa Hippo, MD;  Location: [facility];  Service: Endoscopy;;   BIOPSY  09/12/2022   Procedure: BIOPSY;  Surgeon: Marguerita Merles, Reuel Boom, MD;  Location: [facility];  Service: Gastroenterology;;   CATARACT EXTRACTION Right    retinal procedure done at same time per patient   COLONOSCOPY N/A 10/23/2012   Procedure: COLONOSCOPY;  Surgeon: Malissa Hippo, MD;  Location: [facility];  Service: Endoscopy;  Laterality: N/A;  235   COLONOSCOPY WITH PROPOFOL N/A 09/12/2022   Procedure: COLONOSCOPY WITH PROPOFOL;  Surgeon: Dolores Frame, MD;  Location: [facility];  Service: Gastroenterology;  Laterality: N/A;  12:30PM; ASA 1   ESOPHAGEAL DILATION N/A 05/30/2021   Procedure: ESOPHAGEAL DILATION;  Surgeon: Malissa Hippo, MD;  [facility]: [facility];  Service: Endoscopy;  Laterality: N/A;   ESOPHAGOGASTRODUODENOSCOPY  04/17/2011   Procedure: ESOPHAGOGASTRODUODENOSCOPY (EGD);  Surgeon: Malissa Hippo, MD;  [facility]: [facility];  Service: Endoscopy;  Laterality: N/A;  8:30    ESOPHAGOGASTRODUODENOSCOPY (EGD) WITH PROPOFOL N/A 05/30/2021   Procedure: ESOPHAGOGASTRODUODENOSCOPY (EGD) WITH PROPOFOL;  Surgeon: Malissa Hippo, MD;  [facility]: [facility];  Service: Endoscopy;  Laterality: N/A;  7:30   ESOPHAGOGASTRODUODENOSCOPY (EGD) WITH PROPOFOL N/A 09/12/2022   Procedure: ESOPHAGOGASTRODUODENOSCOPY (EGD) WITH PROPOFOL;  Surgeon: Dolores Frame, MD;  [facility]: [facility];  Service: Gastroenterology;  Laterality: N/A;  12:30PM;ASA 1   EYE SURGERY     HAND SURGERY  05/28/2016   INGUINAL HERNIA REPAIR Left 02/17/2013   Procedure: HERNIA REPAIR INGUINAL ADULT;  Surgeon: Dalia Heading, MD;  Location: [facility];  Service: General;  Laterality: Left;   INSERTION OF MESH Left 02/17/2013   Procedure: INSERTION OF MESH;  Surgeon: Dalia Heading, MD;  Location: [facility];  Service: General;  Laterality: Left;   KNEE ARTHROSCOPY W/ MENISCAL REPAIR Right 11/2021   POLYPECTOMY  09/12/2022   Procedure: POLYPECTOMY INTESTINAL;  Surgeon: Dolores Frame, MD;  Location: [facility];  Service: Gastroenterology;;   SINOSCOPY     sinus sugery     SKIN CANCER EXCISION     x4   SQUAMOUS CELL CARCINOMA EXCISION Left 2023   forearm    Family History  Problem Relation Age of Onset   Aneurysm Father    Asthma Child    Colon cancer Neg Hx    Social History:  reports that he has never smoked. He has never been exposed to tobacco smoke. He quit smokeless tobacco  use about 10 years ago.  His smokeless tobacco use included chew. He reports current alcohol use of about 1.0 standard drink of alcohol per week. He reports that he does not use drugs.  Allergies:  Allergies  Allergen Reactions   Codeine Nausea Only   Crestor [Rosuvastatin] Other (See Comments)    myalgias   Lipitor [Atorvastatin] Other (See Comments)    Myalgias    Zetia [Ezetimibe] Other (See Comments)    myalgias    Medications Prior to Admission  Medication Sig Dispense Refill   allopurinol  (ZYLOPRIM) 300 MG tablet Take 1.5 tablets (450 mg total) by mouth daily.     Coenzyme Q10 (CO Q 10) 100 MG CAPS Take 100 mg by mouth daily.     cyclobenzaprine (FLEXERIL) 5 MG tablet Take 5 mg by mouth daily as needed for muscle spasms (pinched nerve).     ezetimibe (ZETIA) 10 MG tablet Take 10 mg by mouth daily.     fluticasone (FLONASE) 50 MCG/ACT nasal spray Place 2 sprays into both nostrils daily as needed for allergies. (Patient taking differently: Place 2 sprays into both nostrils 2 (two) times daily.) 16 g 5   INCLISIRAN SODIUM [place] Inject into the skin. Received at [facility]     levocetirizine (XYZAL) 5 MG tablet Take 1 tablet (5 mg total) by mouth daily as needed for allergies. (Patient taking differently: Take 5 mg by mouth daily.) 60 tablet 5   lisinopril (PRINIVIL,ZESTRIL) 10 MG tablet Take 10 mg by mouth daily.     Naproxen Sodium (ALEVE PO) Take by mouth as needed.     pantoprazole (PROTONIX) 40 MG tablet Take 1 tablet (40 mg total) by mouth 2 (two) times daily. 180 tablet 1   Phenylephrine-APAP-guaiFENesin (TYLENOL SINUS SEVERE) 5-325-200 MG TABS Take 2 tablets by mouth daily as needed (Headache).     PRESCRIPTION MEDICATION Allergy injections at the allergist office     zolpidem (AMBIEN) 10 MG tablet Take 10 mg by mouth at bedtime.     EPINEPHRINE 0.3 mg/0.3 mL IJ SOAJ injection Inject 0.3 mg into the muscle as needed for anaphylaxis. 2 each 1    No results found for this or any previous visit (from the past 48 hour(s)). No results found.  Review of Systems  HENT:  Positive for trouble swallowing.   All other systems reviewed and are negative.   Blood pressure 137/83, pulse 70, temperature 97.9 F (36.6 C), temperature source Oral, resp. rate 18, SpO2 99%. Physical Exam  GENERAL: The patient is AO x3, in no acute distress. HEENT: Head is normocephalic and atraumatic. EOMI are intact. Mouth is well hydrated and without lesions. NECK: Supple. No masses LUNGS: Clear  to auscultation. No presence of rhonchi/wheezing/rales. Adequate chest expansion HEART: RRR, normal s1 and s2. ABDOMEN: Soft, nontender, no guarding, no peritoneal signs, and nondistended. BS +. No masses. EXTREMITIES: Without any cyanosis, clubbing, rash, lesions or edema. NEUROLOGIC: AOx3, no focal motor deficit. SKIN: no jaundice, no rashes  Assessment/Plan Dennis Miles is a 72 y.o. male with PMH GERD, EoE,  anxiety, HTN, gout, skin cancer, who presents for follow up of EoE and GERD.  Will proceed with EGD.  Dolores Frame, MD 04/14/2023, 9:30 AM

## 2023-04-14 NOTE — Op Note (Signed)
[facility] Patient Name: Dennis Miles Procedure Date: 04/14/2023 9:25 AM MRN: [id number] Date of Birth: 1951/02/06 Attending MD: Katrinka Blazing , , [id number] CSN: [id number] Age: 72 Admit Type: Outpatient Procedure:                Upper GI endoscopy Indications:              Dysphagia, Follow-up of eosinophilic esophagitis Providers:                Katrinka Blazing, Buel Ream. Museum/gallery exhibitions officer, Charity fundraiser,                            Judeth Cornfield. Jessee Avers, Technician Referring MD:              Medicines:                Monitored Anesthesia Care Complications:            No immediate complications. Estimated Blood Loss:     Estimated blood loss: none. Procedure:                Pre-Anesthesia Assessment:                           - Prior to the procedure, a History and Physical                            was performed, and patient medications, allergies                            and sensitivities were reviewed. The patient's                            tolerance of previous anesthesia was reviewed.                           - The risks and benefits of the procedure and the                            sedation options and risks were discussed with the                            patient. All questions were answered and informed                            consent was obtained.                           After obtaining informed consent, the endoscope was                            passed under direct vision. Throughout the                            procedure, the patient's blood pressure, pulse, and                            oxygen  saturations were monitored continuously. The                            GIF-H190 ([id number]) scope was introduced through the                            mouth, and advanced to the second part of duodenum.                            The upper GI endoscopy was accomplished without                            difficulty. The patient tolerated the procedure                             well. Scope In: 9:39:19 AM Scope Out: 9:49:32 AM Total Procedure Duration: 0 hours 10 minutes 13 seconds  Findings:      Mucosal changes including circumferential folds were found in the middle       third of the esophagus. Biopsies were obtained from the proximal and       distal esophagus with cold forceps for histology of suspected       eosinophilic esophagitis.      One benign-appearing, intrinsic moderate (circumferential scarring or       stenosis; an endoscope may pass) stenosis was found at the       gastroesophageal junction. This stenosis measured 1 cm (inner diameter)       x less than one cm (in length). The stenosis was traversed. A TTS       dilator was passed through the scope. Dilation with a 15-16.5-18 mm       balloon dilator was performed to 18 mm. A cold forceps was used to       disrupt the rim of the ring.      The stomach was normal.      The examined duodenum was normal. Impression:               - Esophageal mucosal changes.                           - Benign-appearing esophageal stenosis. Dilated.                           - Normal stomach.                           - Normal examined duodenum.                           - Biopsies were taken with a cold forceps for                            evaluation of eosinophilic esophagitis. Moderate Sedation:      Per Anesthesia Care Recommendation:           - Discharge patient to home (ambulatory).                           -  Resume previous diet.                           - Await pathology results.                           - Continue present medications. Procedure Code(s):        --- Professional ---                           [phone], Esophagogastroduodenoscopy, flexible,                            transoral; with transendoscopic balloon dilation of                            esophagus (less than 30 mm diameter)                           [id number], 59, Esophagogastroduodenoscopy, flexible,                             transoral; with biopsy, single or multiple Diagnosis Code(s):        --- Professional ---                           K22.89, Other specified disease of esophagus                           K22.2, Esophageal obstruction                           R13.10, Dysphagia, unspecified                           K20.0, Eosinophilic esophagitis CPT copyright 2022 [employer]. All rights reserved. The codes documented in this report are preliminary and upon coder review may  be revised to meet current compliance requirements. Katrinka Blazing, MD Katrinka Blazing,  04/14/2023 10:05:05 AM This report has been signed electronically. Number of Addenda: 0

## 2023-04-14 NOTE — Discharge Instructions (Signed)
You are being discharged to home.  ?Resume your previous diet.  ?We are waiting for your pathology results.  ?Continue your present medications.  ?

## 2023-04-14 NOTE — Anesthesia Postprocedure Evaluation (Signed)
Anesthesia Post Note  Patient: YANZEL MULLARKEY  Procedure(s) Performed: ESOPHAGOGASTRODUODENOSCOPY (EGD) WITH PROPOFOL BIOPSY Balloon dilation wire-guided  Patient location during evaluation: PACU Anesthesia Type: General Level of consciousness: awake and alert Pain management: pain level controlled Vital Signs Assessment: post-procedure vital signs reviewed and stable Respiratory status: spontaneous breathing, nonlabored ventilation, respiratory function stable and patient connected to nasal cannula oxygen Cardiovascular status: blood pressure returned to baseline and stable Postop Assessment: no apparent nausea or vomiting Anesthetic complications: no   There were no known notable events for this encounter.   Last Vitals:  Vitals:   04/14/23 0753 04/14/23 0954  BP: 137/83 113/65  Pulse: 70 71  Resp: 18 18  Temp: 36.6 C 36.5 C  SpO2: 99% 100%    Last Pain:  Vitals:   04/14/23 0954  TempSrc: Oral  PainSc: 0-No pain                 Lumen Brinlee L Shelbi Vaccaro

## 2023-04-14 NOTE — Anesthesia Preprocedure Evaluation (Addendum)
Anesthesia Evaluation  Patient identified by MRN, date of birth, ID band Patient awake    Reviewed: Allergy & Precautions, H&P , NPO status , Patient's Chart, lab work & pertinent test results, reviewed documented beta blocker date and time   Airway Mallampati: II  TM Distance: >3 FB Neck ROM: full    Dental  (+) Dental Advisory Given, Caps 2 temporary crowns upper front and all upper front teeth are crowns:   Pulmonary neg pulmonary ROS   Pulmonary exam normal breath sounds clear to auscultation       Cardiovascular Exercise Tolerance: Good hypertension, Normal cardiovascular exam Rhythm:regular Rate:Normal     Neuro/Psych  Headaches  Anxiety      Neuromuscular disease  negative psych ROS   GI/Hepatic Neg liver ROS, hiatal hernia,GERD  ,,  Endo/Other  negative endocrine ROS    Renal/GU negative Renal ROS  negative genitourinary   Musculoskeletal  (+) Arthritis , Osteoarthritis,    Abdominal   Peds  Hematology negative hematology ROS (+)   Anesthesia Other Findings   Reproductive/Obstetrics negative OB ROS                             Anesthesia Physical Anesthesia Plan  ASA: 2  Anesthesia Plan: General   Post-op Pain Management:    Induction: Intravenous  PONV Risk Score and Plan: Propofol infusion  Airway Management Planned: Nasal Cannula  Additional Equipment: None  Intra-op Plan:   Post-operative Plan:   Informed Consent: I have reviewed the patients History and Physical, chart, labs and discussed the procedure including the risks, benefits and alternatives for the proposed anesthesia with the patient or authorized representative who has indicated his/her understanding and acceptance.     Dental Advisory Given  Plan Discussed with: CRNA  Anesthesia Plan Comments:        Anesthesia Quick Evaluation

## 2023-04-14 NOTE — Transfer of Care (Signed)
Immediate Anesthesia Transfer of Care Note  Patient: Dennis Miles  Procedure(s) Performed: ESOPHAGOGASTRODUODENOSCOPY (EGD) WITH PROPOFOL BIOPSY Balloon dilation wire-guided  Patient Location: Endoscopy Unit  Anesthesia Type:General  Level of Consciousness: awake, alert , and oriented  Airway & Oxygen Therapy: Patient Spontanous Breathing and Patient connected to face mask oxygen  Post-op Assessment: Report given to RN and Post -op Vital signs reviewed and stable  Post vital signs: Reviewed and stable  Last Vitals:  Vitals Value Taken Time  BP 113/65 04/14/23 0954  Temp 36.5 C 04/14/23 0954  Pulse 71 04/14/23 0954  Resp 18 04/14/23 0954  SpO2 100 % 04/14/23 0954    Last Pain:  Vitals:   04/14/23 0954  TempSrc: Oral  PainSc: 0-No pain      Patients Stated Pain Goal: 6 (04/14/23 0753)  Complications: No notable events documented.

## 2023-04-15 LAB — SURGICAL PATHOLOGY

## 2023-04-17 ENCOUNTER — Encounter (INDEPENDENT_AMBULATORY_CARE_PROVIDER_SITE_OTHER): Payer: Self-pay | Admitting: *Deleted

## 2023-04-18 ENCOUNTER — Encounter (HOSPITAL_COMMUNITY): Payer: Self-pay | Admitting: Gastroenterology

## 2023-04-22 ENCOUNTER — Ambulatory Visit: Payer: Medicare Other

## 2023-04-22 NOTE — Progress Notes (Signed)
Patient presents today to pick up custom molded foot orthotics, diagnosed with Hammertoes by Dr. Lilian Kapur.   Orthotics were dispensed and fit was satisfactory. Reviewed instructions for break-in and wear. Written instructions given to patient.  Patient will follow up as needed.   Dennis Miles Cped, CFo, CFm

## 2023-04-23 ENCOUNTER — Ambulatory Visit (INDEPENDENT_AMBULATORY_CARE_PROVIDER_SITE_OTHER): Payer: Medicare Other

## 2023-04-23 DIAGNOSIS — L814 Other melanin hyperpigmentation: Secondary | ICD-10-CM | POA: Diagnosis not present

## 2023-04-23 DIAGNOSIS — D485 Neoplasm of uncertain behavior of skin: Secondary | ICD-10-CM | POA: Diagnosis not present

## 2023-04-23 DIAGNOSIS — D225 Melanocytic nevi of trunk: Secondary | ICD-10-CM | POA: Diagnosis not present

## 2023-04-23 DIAGNOSIS — D2271 Melanocytic nevi of right lower limb, including hip: Secondary | ICD-10-CM | POA: Diagnosis not present

## 2023-04-23 DIAGNOSIS — L570 Actinic keratosis: Secondary | ICD-10-CM | POA: Diagnosis not present

## 2023-04-23 DIAGNOSIS — L820 Inflamed seborrheic keratosis: Secondary | ICD-10-CM | POA: Diagnosis not present

## 2023-04-23 DIAGNOSIS — L578 Other skin changes due to chronic exposure to nonionizing radiation: Secondary | ICD-10-CM | POA: Diagnosis not present

## 2023-04-23 DIAGNOSIS — Z85828 Personal history of other malignant neoplasm of skin: Secondary | ICD-10-CM | POA: Diagnosis not present

## 2023-04-23 DIAGNOSIS — J309 Allergic rhinitis, unspecified: Secondary | ICD-10-CM | POA: Diagnosis not present

## 2023-04-23 DIAGNOSIS — D2272 Melanocytic nevi of left lower limb, including hip: Secondary | ICD-10-CM | POA: Diagnosis not present

## 2023-04-23 DIAGNOSIS — L821 Other seborrheic keratosis: Secondary | ICD-10-CM | POA: Diagnosis not present

## 2023-04-24 ENCOUNTER — Encounter: Payer: Medicare Other | Attending: Internal Medicine | Admitting: *Deleted

## 2023-04-24 VITALS — BP 135/82 | HR 85 | Temp 97.5°F | Resp 18

## 2023-04-24 DIAGNOSIS — I2510 Atherosclerotic heart disease of native coronary artery without angina pectoris: Secondary | ICD-10-CM | POA: Insufficient documentation

## 2023-04-24 DIAGNOSIS — E782 Mixed hyperlipidemia: Secondary | ICD-10-CM | POA: Insufficient documentation

## 2023-04-24 MED ORDER — INCLISIRAN SODIUM 284 MG/1.5ML ~~LOC~~ SOSY
284.0000 mg | PREFILLED_SYRINGE | Freq: Once | SUBCUTANEOUS | Status: AC
  Administered 2023-04-24: 284 mg via SUBCUTANEOUS

## 2023-04-24 NOTE — Progress Notes (Signed)
Diagnosis: Hyperlipidemia  Provider:  Dwana Melena MD  Procedure: Injection  Leqvio (inclisiran), Dose: 284 mg, Site: subcutaneous, Number of injections: 1  Post Care: Observation period completed  Discharge: Condition: Good, Destination: Home . AVS Provided  Performed by:  Tonye Pearson, RN

## 2023-04-24 NOTE — Progress Notes (Unsigned)
Office Visit Note  Patient: Dennis Miles             Date of Birth: April 02, 1951           MRN: [id number]             PCP: Benita Stabile, MD Referring: Benita Stabile, MD Visit Date: 05/08/2023 Occupation: @GUAROCC @  Subjective:  Medication monitoring  History of Present Illness: Dennis Miles is a 72 y.o. male with history of gout.  Patient is taking allopurinol 450 mg by mouth daily.   He has been tolerating the increased dose of allopurinol without any side effects.  He denies any signs or symptoms of a gout flare since his last office visit.  Patient states that the swelling in his right knee has resolved.  He denies any increased joint pain or joint swelling at this time. He was recently fitted for orthotics for both feet and has been wearing them for 2 hours daily as advised.    Activities of Daily Living:  Patient reports morning stiffness for a few minutes.   Patient Reports nocturnal pain.  Difficulty dressing/grooming: Denies Difficulty climbing stairs: Denies Difficulty getting out of chair: Denies Difficulty using hands for taps, buttons, cutlery, and/or writing: Reports  Review of Systems  Constitutional:  Positive for fatigue.  HENT:  Positive for mouth dryness. Negative for mouth sores.   Eyes:  Positive for dryness. Negative for pain and visual disturbance.  Respiratory:  Negative for shortness of breath.   Cardiovascular:  Negative for chest pain and palpitations.  Gastrointestinal:  Positive for constipation and diarrhea. Negative for blood in stool.  Endocrine: Negative for increased urination.  Genitourinary:  Negative for involuntary urination.  Musculoskeletal:  Positive for myalgias, morning stiffness and myalgias. Negative for joint pain, gait problem, joint pain, joint swelling, muscle weakness and muscle tenderness.  Skin:  Negative for color change, rash and sensitivity to sunlight.  Allergic/Immunologic: Negative for susceptible to infections.   Neurological:  Negative for dizziness and headaches.  Hematological:  Negative for swollen glands.  Psychiatric/Behavioral:  Positive for sleep disturbance. Negative for depressed mood. The patient is not nervous/anxious.     PMFS History:  Patient Active Problem List   Diagnosis Date Noted   Ischemic heart disease 01/16/2023   ASCVD (arteriosclerotic cardiovascular disease) 01/16/2023   History of colonic polyps 08/15/2022   S/P arthroscopy of right knee 12/25/2021   Acute lateral meniscus tear of right knee 11/21/2021   Seasonal and perennial allergic rhinitis 10/26/2021   Eosinophilic esophagitis 07/17/2021   Esophageal dysphagia 05/14/2021   Abdominal pain, epigastric 05/14/2021   IBS (irritable bowel syndrome) 11/13/2020   Primary insomnia 09/26/2016   DJD (degenerative joint disease), cervical 09/26/2016   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   Abdominal pain, left lower quadrant 10/19/2012   Gastroesophageal reflux disease 04/15/2012   Gout 04/01/2011   Hypertension 04/01/2011   Elevated cholesterol with high triglycerides 04/01/2011    Past Medical History:  Diagnosis Date   Anxiety    GERD (gastroesophageal reflux disease)    Gout    Headache(784.0)    Hiatal hernia    High cholesterol    Hypertension    Skin cancer     Family History  Problem Relation Age of Onset   Aneurysm Father    Asthma Child    Colon cancer Neg Hx    Past Surgical History:  Procedure Laterality Date   APPENDECTOMY  BALLOON DILATION  09/12/2022   Procedure: BALLOON DILATION;  Surgeon: Marguerita Merles, Reuel Boom, MD;  [facility];  Service: Gastroenterology;;   BIOPSY  05/30/2021   Procedure: BIOPSY;  Surgeon: Malissa Hippo, MD;  [facility];  Service: Endoscopy;;   BIOPSY  09/12/2022   Procedure: BIOPSY;  Surgeon: Dolores Frame, MD;  [facility];  Service: Gastroenterology;;   BIOPSY  04/14/2023    Procedure: BIOPSY;  Surgeon: Marguerita Merles, Reuel Boom, MD;  [facility];  Service: Gastroenterology;;   CATARACT EXTRACTION Right    retinal procedure done at same time per patient   COLONOSCOPY N/A 10/23/2012   Procedure: COLONOSCOPY;  Surgeon: Malissa Hippo, MD;  [facility];  Service: Endoscopy;  Laterality: N/A;  235   COLONOSCOPY WITH PROPOFOL N/A 09/12/2022   Procedure: COLONOSCOPY WITH PROPOFOL;  Surgeon: Dolores Frame, MD;  [facility];  Service: Gastroenterology;  Laterality: N/A;  12:30PM; ASA 1   ESOPHAGEAL DILATION N/A 05/30/2021   Procedure: ESOPHAGEAL DILATION;  Surgeon: Malissa Hippo, MD;  [facility];  Service: Endoscopy;  Laterality: N/A;   ESOPHAGOGASTRODUODENOSCOPY  04/17/2011   Procedure: ESOPHAGOGASTRODUODENOSCOPY (EGD);  Surgeon: Malissa Hippo, MD;  [facility];  Service: Endoscopy;  Laterality: N/A;  8:30   ESOPHAGOGASTRODUODENOSCOPY (EGD) WITH PROPOFOL N/A 05/30/2021   Procedure: ESOPHAGOGASTRODUODENOSCOPY (EGD) WITH PROPOFOL;  Surgeon: Malissa Hippo, MD;  [facility];  Service: Endoscopy;  Laterality: N/A;  7:30   ESOPHAGOGASTRODUODENOSCOPY (EGD) WITH PROPOFOL N/A 09/12/2022   Procedure: ESOPHAGOGASTRODUODENOSCOPY (EGD) WITH PROPOFOL;  Surgeon: Dolores Frame, MD;  [facility];  Service: Gastroenterology;  Laterality: N/A;  12:30PM;ASA 1   ESOPHAGOGASTRODUODENOSCOPY (EGD) WITH PROPOFOL N/A 04/14/2023   Procedure: ESOPHAGOGASTRODUODENOSCOPY (EGD) WITH PROPOFOL;  Surgeon: Dolores Frame, MD;  [facility];  Service: Gastroenterology;  Laterality: N/A;  8:45AM;ASA 1-2   EYE SURGERY     HAND SURGERY  05/28/2016   INGUINAL HERNIA REPAIR Left 02/17/2013   Procedure: HERNIA REPAIR INGUINAL ADULT;  Surgeon: Dalia Heading, MD;  [facility]: [facility];  Service: General;  Laterality: Left;   INSERTION OF MESH Left 02/17/2013   Procedure: INSERTION  OF MESH;  Surgeon: Dalia Heading, MD;  Location: [facility];  Service: General;  Laterality: Left;   KNEE ARTHROSCOPY W/ MENISCAL REPAIR Right 11/2021   POLYPECTOMY  09/12/2022   Procedure: POLYPECTOMY INTESTINAL;  Surgeon: Dolores Frame, MD;  [facility];  Service: Gastroenterology;;   SINOSCOPY     sinus sugery     SKIN CANCER EXCISION     x4   SQUAMOUS CELL CARCINOMA EXCISION Left 2023   forearm   SQUAMOUS CELL CARCINOMA EXCISION  05/05/2023   on scalp, per patient   Social History   Social History Narrative   Lives at home with daughter.  Retired.  Education 12th grade.  Children 2.     Immunization History  Administered Date(s) Administered   PFIZER(Purple Top)SARS-COV-2 Vaccination 08/02/2019, 08/23/2019, 06/22/2020     Objective: Vital Signs: BP 133/85 (BP Location: Left Arm, Patient Position: Sitting, Cuff Size: Normal)   Pulse 75   Resp 14   Ht 5\' 9"  (1.753 m)   Wt 168 lb 3.2 oz (76.3 kg)   BMI 24.84 kg/m    Physical Exam Vitals and nursing note reviewed.  Constitutional:      Appearance: He is well-developed.  HENT:  Head: Normocephalic and atraumatic.  Eyes:     Conjunctiva/sclera: Conjunctivae normal.     Pupils: Pupils are equal, round, and reactive to light.  Cardiovascular:     Rate and Rhythm: Normal rate and regular rhythm.     Heart sounds: Normal heart sounds.  Pulmonary:     Effort: Pulmonary effort is normal.     Breath sounds: Normal breath sounds.  Abdominal:     General: Bowel sounds are normal.     Palpations: Abdomen is soft.  Musculoskeletal:     Cervical back: Normal range of motion and neck supple.  Skin:    General: Skin is warm and dry.     Capillary Refill: Capillary refill takes less than 2 seconds.  Neurological:     Mental Status: He is alert and oriented to person, place, and time.  Psychiatric:        Behavior: Behavior normal.      Musculoskeletal Exam: C-spine has good range of motion with  crepitus.  Shoulder joints have good range of motion with some crepitus in the left shoulder.  Elbow joints, wrist joints, MCPs, PIPs, DIPs have good range of motion with no synovitis.  PIP and DIP thickening consistent with osteoarthritis of both hands.  Complete fist formation bilaterally.  Hip joints have good range of motion with no groin pain.  Synovial thickening of the right knee but no effusion noted.  Left knee has full range of motion with no warmth or effusion.  Ankle joints have good range of motion no tenderness or joint swelling.  No tenderness or synovitis over MTP joints.  Right second hammertoe is noted bilaterally.  PIP and DIP thickening consistent with osteoarthritis of both feet.  CDAI Exam: CDAI Score: -- Patient Global: --; Provider Global: -- Swollen: --; Tender: -- Joint Exam 05/08/2023   No joint exam has been documented for this visit   There is currently no information documented on the homunculus. Go to the Rheumatology activity and complete the homunculus joint exam.  Investigation: No additional findings.  Imaging: No results found.  Recent Labs: Lab Results  Component Value Date   WBC 6.2 10/30/2021   HGB 13.8 10/30/2021   PLT 211 10/30/2021   NA 139 10/30/2021   K 4.4 10/30/2021   CL 100 10/30/2021   CO2 28 10/30/2021   GLUCOSE 121 (H) 10/30/2021   BUN 7 10/30/2021   CREATININE 1.00 10/30/2021   BILITOT 0.5 10/30/2021   ALKPHOS 71 09/26/2016   AST 24 10/30/2021   ALT 18 10/30/2021   PROT 6.6 10/30/2021   ALBUMIN 4.2 09/26/2016   CALCIUM 9.1 10/30/2021   GFRAA 75 07/11/2020    Speciality Comments: No specialty comments available.  Procedures:  No procedures performed Allergies: Codeine, Crestor [rosuvastatin], Lipitor [atorvastatin], and Zetia [ezetimibe]   Assessment / Plan:     Visit Diagnoses: Idiopathic chronic gout of multiple sites with tophus -He has not had any signs or symptoms of a gout flare.  He has clinically been doing well  taking allopurinol 450 mg daily since his last office visit.  His uric acid was 6.1 on 09/30/2022 and after increasing the dose of allopurinol his uric acid has been reduced to 3.0 on 04/01/2023.  The inflammation in his right knee has resolved.  He is not experiencing any other joint pain or inflammation at this time.  He will continue to require updated lab work every 6 months.  No medication changes will be made at this time.  He was advised to notify us if he develops signs or symptoms of a flare.  He will follow-up in the office in 6 months or sooner if needed.   Medication management: CBC and CMP updated on 04/01/23-LFTs within normal limits and creatinine and GFR within normal limits.  Uric acid was within the desirable range-3.0 on 04/01/23.  He will remain on allopurinol 450 mg daily.  Effusion, right knee: Synovial thickening of the right knee noted.  No effusion or warmth noted today.  Chronic pain of right knee - Underwent right knee arthroscopic surgery in May 2023 performed by Dr. Roda Shutters.  He is no longer having inflammation in his right knee.  Synovial thickening was noted.  No effusion noted.  Primary osteoarthritis of both feet: Patient has PIP and DIP thickening consistent with osteoarthritis of both hands.  Right second hammertoes noted bilaterally. He was recently fitted for orthotics which he has been wearing for 2 hours daily as advised.    DDD (degenerative disc disease), cervical:  C-spine has good ROM with crepitus.  No discomfort or symptoms of radiculopathy.  Primary insomnia - He continues to take Ambien 10 mg at bedtime for insomnia.   Other medical conditions are listed as follows:  History of hyperlipidemia  History of gastroesophageal reflux (GERD)  History of hypertension: Blood pressure was 133/85 today in the office.    Orders: No orders of the defined types were placed in this encounter.  No orders of the defined types were placed in this  encounter.    Follow-Up Instructions: Return in about 6 months (around 11/06/2023) for Gout.   Gearldine Bienenstock, PA-C  Note - This record has been created using Dragon software.  Chart creation errors have been sought, but may not always  have been located. Such creation errors do not reflect on  the standard of medical care.

## 2023-05-05 DIAGNOSIS — C4442 Squamous cell carcinoma of skin of scalp and neck: Secondary | ICD-10-CM | POA: Diagnosis not present

## 2023-05-05 HISTORY — PX: SQUAMOUS CELL CARCINOMA EXCISION: SHX2433

## 2023-05-08 ENCOUNTER — Encounter: Payer: Self-pay | Admitting: Physician Assistant

## 2023-05-08 ENCOUNTER — Encounter: Payer: Medicare Other | Attending: Internal Medicine | Admitting: Physician Assistant

## 2023-05-08 ENCOUNTER — Other Ambulatory Visit: Payer: Self-pay

## 2023-05-08 VITALS — BP 133/85 | HR 75 | Resp 14 | Ht 69.0 in | Wt 168.2 lb

## 2023-05-08 DIAGNOSIS — F5101 Primary insomnia: Secondary | ICD-10-CM | POA: Diagnosis not present

## 2023-05-08 DIAGNOSIS — Z8639 Personal history of other endocrine, nutritional and metabolic disease: Secondary | ICD-10-CM

## 2023-05-08 DIAGNOSIS — Z8679 Personal history of other diseases of the circulatory system: Secondary | ICD-10-CM

## 2023-05-08 DIAGNOSIS — M19071 Primary osteoarthritis, right ankle and foot: Secondary | ICD-10-CM

## 2023-05-08 DIAGNOSIS — G8929 Other chronic pain: Secondary | ICD-10-CM

## 2023-05-08 DIAGNOSIS — Z8719 Personal history of other diseases of the digestive system: Secondary | ICD-10-CM | POA: Diagnosis not present

## 2023-05-08 DIAGNOSIS — M25461 Effusion, right knee: Secondary | ICD-10-CM

## 2023-05-08 DIAGNOSIS — M25561 Pain in right knee: Secondary | ICD-10-CM

## 2023-05-08 DIAGNOSIS — Z79899 Other long term (current) drug therapy: Secondary | ICD-10-CM | POA: Diagnosis not present

## 2023-05-08 DIAGNOSIS — M5030 Other cervical disc degeneration, unspecified cervical region: Secondary | ICD-10-CM

## 2023-05-08 DIAGNOSIS — M19072 Primary osteoarthritis, left ankle and foot: Secondary | ICD-10-CM

## 2023-05-08 DIAGNOSIS — M1A09X1 Idiopathic chronic gout, multiple sites, with tophus (tophi): Secondary | ICD-10-CM

## 2023-05-08 MED ORDER — ALLOPURINOL 300 MG PO TABS: 450.0000 mg | ORAL_TABLET | Freq: Every day | ORAL | 0 refills | Status: AC

## 2023-05-08 NOTE — Telephone Encounter (Signed)
Please review and sign pended refill of allopurinol. Thanks!

## 2023-05-09 ENCOUNTER — Ambulatory Visit (INDEPENDENT_AMBULATORY_CARE_PROVIDER_SITE_OTHER): Payer: Medicare Other

## 2023-05-09 DIAGNOSIS — J309 Allergic rhinitis, unspecified: Secondary | ICD-10-CM

## 2023-05-30 DIAGNOSIS — J019 Acute sinusitis, unspecified: Secondary | ICD-10-CM | POA: Diagnosis not present

## 2023-05-30 DIAGNOSIS — J069 Acute upper respiratory infection, unspecified: Secondary | ICD-10-CM | POA: Diagnosis not present

## 2023-06-09 ENCOUNTER — Other Ambulatory Visit: Payer: Self-pay | Admitting: Internal Medicine

## 2023-06-12 ENCOUNTER — Encounter (INDEPENDENT_AMBULATORY_CARE_PROVIDER_SITE_OTHER): Payer: Self-pay | Admitting: Gastroenterology

## 2023-06-12 ENCOUNTER — Ambulatory Visit (INDEPENDENT_AMBULATORY_CARE_PROVIDER_SITE_OTHER): Payer: Medicare Other | Admitting: Gastroenterology

## 2023-06-12 VITALS — BP 144/84 | HR 72 | Temp 97.6°F | Ht 69.0 in | Wt 166.0 lb

## 2023-06-12 DIAGNOSIS — K219 Gastro-esophageal reflux disease without esophagitis: Secondary | ICD-10-CM

## 2023-06-12 DIAGNOSIS — R1310 Dysphagia, unspecified: Secondary | ICD-10-CM | POA: Diagnosis not present

## 2023-06-12 DIAGNOSIS — Z8719 Personal history of other diseases of the digestive system: Secondary | ICD-10-CM | POA: Diagnosis not present

## 2023-06-12 DIAGNOSIS — K2100 Gastro-esophageal reflux disease with esophagitis, without bleeding: Secondary | ICD-10-CM

## 2023-06-12 MED ORDER — OMEPRAZOLE 40 MG PO CPDR: 40.0000 mg | DELAYED_RELEASE_CAPSULE | Freq: Two times a day (BID) | ORAL | 3 refills | Status: DC

## 2023-06-12 NOTE — Progress Notes (Addendum)
 Referring Provider: Shona Norleen PEDLAR, MD Primary Care Physician:  Shona Norleen PEDLAR, MD Primary GI Physician: Dr. Eartha   Chief Complaint  Patient presents with   Abdominal Pain    6 month follow up. Having some epigastric pain. Having some trouble swallowing pills.    HPI:   Dennis Miles is a 73 y.o. male with past medical history of GERD, EOE, anxiety, HTN, gout, skin cancer   Patient presenting today for follow up of GERD/Dysphagia/EOE   Last seen July 2024, at that time patient sent to suggest recent EGD and biopsy wound on chest pressure.  Denies any particular dysphagia problem.  No heartburn.  Currently taking pantoprazole  40 mg twice daily.  Recommend to continue pantoprazole  40 mg twice daily, schedule EGD in October 2024, chewing precautions  Present: He notes that he had some issues swallowing some larger antibiotic pills around christmas that he had to cut to be able to swallow. He notes more sensation of phlegm in his throat that he was given an antibiotic and steroids for and had improvement in this. He denies any issues with dysphagia with foods or liquids. He is still having some issues with reflux, maybe 2-3 times per week, depending how late he eats or how much he eats. Tomato based foods tend to make this worse when he does have symptoms.  He has taken a few tums when he has breakthrough symptoms at night which seems to help. Denies any abdominal pain or odynophagia. No nausea or vomiting. Weight is stable, no changes in appetite. He has occasional diarrhea mixed with his solid stools which is baseline for him.   No red flag symptoms. Patient denies melena, hematochezia, nausea, vomiting, diarrhea, constipation, dysphagia, odyonophagia, early satiety or weight loss.   Last EGD: 04/2023- Esophageal mucosal changes.                           - Benign-appearing esophageal stenosis. Dilated.                           - Normal stomach.                           - Normal  examined duodenum.                           - Biopsies were taken with a cold forceps for                            evaluation of eosinophilic esophagitis.  A. ESOPHAGUS, DISTAL, BIOPSY: Squamous mucosa with reflux changes and increased intraepithelial eosinophils. Greater than 20 per high-power field.   B. ESOPHAGUS, MID, BIOPSY: Squamous mucosa with focally increased intraepithelial eosinophils. Focally up to 13 per high-power field.  Last Colonoscopy: 09/12/2022 5 mm polyp in the descending colon, internal hemorrhoids. Pathology consistent with a hyperplastic polyp   Repeat colonoscopy in 10 years.    Past Medical History:  Diagnosis Date   Anxiety    GERD (gastroesophageal reflux disease)    Gout    Headache(784.0)    Hiatal hernia    High cholesterol    Hypertension    Skin cancer     Past Surgical History:  Procedure Laterality Date   APPENDECTOMY     BALLOON DILATION  09/12/2022   Procedure: BALLOON DILATION;  Surgeon: Eartha Flavors, Toribio, MD;  [facility];  Service: Gastroenterology;;   BIOPSY  05/30/2021   Procedure: BIOPSY;  Surgeon: Golda Claudis PENNER, MD;  [facility];  Service: Endoscopy;;   BIOPSY  09/12/2022   Procedure: BIOPSY;  Surgeon: Eartha Flavors Toribio, MD;  [facility];  Service: Gastroenterology;;   BIOPSY  04/14/2023   Procedure: BIOPSY;  Surgeon: Eartha Flavors, Toribio, MD;  [facility];  Service: Gastroenterology;;   CATARACT EXTRACTION Right    retinal procedure done at same time per patient   COLONOSCOPY N/A 10/23/2012   Procedure: COLONOSCOPY;  Surgeon: Claudis PENNER Golda, MD;  [facility];  Service: Endoscopy;  Laterality: N/A;  235   COLONOSCOPY WITH PROPOFOL  N/A 09/12/2022   Procedure: COLONOSCOPY WITH PROPOFOL ;  Surgeon: Eartha Flavors Toribio, MD;  [facility];  Service: Gastroenterology;  Laterality: N/A;  12:30PM; ASA 1   ESOPHAGEAL DILATION N/A  05/30/2021   Procedure: ESOPHAGEAL DILATION;  Surgeon: Golda Claudis PENNER, MD;  [facility];  Service: Endoscopy;  Laterality: N/A;   ESOPHAGOGASTRODUODENOSCOPY  04/17/2011   Procedure: ESOPHAGOGASTRODUODENOSCOPY (EGD);  Surgeon: Claudis PENNER Golda, MD;  [facility];  Service: Endoscopy;  Laterality: N/A;  8:30   ESOPHAGOGASTRODUODENOSCOPY (EGD) WITH PROPOFOL  N/A 05/30/2021   Procedure: ESOPHAGOGASTRODUODENOSCOPY (EGD) WITH PROPOFOL ;  Surgeon: Golda Claudis PENNER, MD;  [facility];  Service: Endoscopy;  Laterality: N/A;  7:30   ESOPHAGOGASTRODUODENOSCOPY (EGD) WITH PROPOFOL  N/A 09/12/2022   Procedure: ESOPHAGOGASTRODUODENOSCOPY (EGD) WITH PROPOFOL ;  Surgeon: Eartha Flavors Toribio, MD;  [facility];  Service: Gastroenterology;  Laterality: N/A;  12:30PM;ASA 1   ESOPHAGOGASTRODUODENOSCOPY (EGD) WITH PROPOFOL  N/A 04/14/2023   Procedure: ESOPHAGOGASTRODUODENOSCOPY (EGD) WITH PROPOFOL ;  Surgeon: Eartha Flavors Toribio, MD;  [facility];  Service: Gastroenterology;  Laterality: N/A;  8:45AM;ASA 1-2   EYE SURGERY     HAND SURGERY  05/28/2016   INGUINAL HERNIA REPAIR Left 02/17/2013   Procedure: HERNIA REPAIR INGUINAL ADULT;  Surgeon: Oneil DELENA Budge, MD;  Location: [facility];  Service: General;  Laterality: Left;   INSERTION OF MESH Left 02/17/2013   Procedure: INSERTION OF MESH;  Surgeon: Oneil DELENA Budge, MD;  Location: [facility];  Service: General;  Laterality: Left;   KNEE ARTHROSCOPY W/ MENISCAL REPAIR Right 11/2021   POLYPECTOMY  09/12/2022   Procedure: POLYPECTOMY INTESTINAL;  Surgeon: Eartha Flavors Toribio, MD;  [facility];  Service: Gastroenterology;;   SINOSCOPY     sinus sugery     SKIN CANCER EXCISION     x4   SQUAMOUS CELL CARCINOMA EXCISION Left 2023   forearm   SQUAMOUS CELL CARCINOMA EXCISION  05/05/2023   on scalp, per patient    Current Outpatient Medications  Medication Sig Dispense Refill   allopurinol   (ZYLOPRIM ) 300 MG tablet Take 1.5 tablets (450 mg total) by mouth daily. 135 tablet 0   Coenzyme Q10 (CO Q 10) 100 MG CAPS Take 100 mg by mouth daily.     cyclobenzaprine (FLEXERIL) 5 MG tablet Take 5 mg by mouth daily as needed for muscle spasms (pinched nerve).     EPINEPHRINE  0.3 mg/0.3 mL IJ SOAJ injection Inject 0.3 mg into the muscle as needed for anaphylaxis. 2 each 1   INCLISIRAN SODIUM  [place] Inject into the skin. Received at [facility]     levocetirizine (XYZAL ) 5 MG tablet Take 1 tablet (5 mg  total) by mouth daily as needed for allergies. (Patient taking differently: Take 5 mg by mouth daily.) 60 tablet 5   lisinopril (PRINIVIL,ZESTRIL) 10 MG tablet Take 10 mg by mouth daily.     Naproxen Sodium (ALEVE PO) Take by mouth as needed.     pantoprazole  (PROTONIX ) 40 MG tablet Take 1 tablet (40 mg total) by mouth 2 (two) times daily. 180 tablet 1   Phenylephrine -APAP-guaiFENesin (TYLENOL  SINUS SEVERE) 5-325-200 MG TABS Take 2 tablets by mouth daily as needed (Headache).     PRESCRIPTION MEDICATION Allergy injections at the allergist office     zolpidem (AMBIEN) 10 MG tablet Take 10 mg by mouth at bedtime.     No current facility-administered medications for this visit.    Allergies as of 06/12/2023 - Review Complete 06/12/2023  Allergen Reaction Noted   Codeine Nausea Only 04/01/2011   Crestor  [rosuvastatin ] Other (See Comments) 07/26/2021   Lipitor [atorvastatin] Other (See Comments) 07/26/2021   Zetia [ezetimibe] Other (See Comments) 07/26/2021    Family History  Problem Relation Age of Onset   Aneurysm Father    Asthma Child    Colon cancer Neg Hx     Social History   Socioeconomic History   Marital status: Divorced    Spouse name: Not on file   Number of children: Not on file   Years of education: Not on file   Highest education level: Not on file  Occupational History   Not on file  Tobacco Use   Smoking status: Never    Passive exposure: Never   Smokeless  tobacco: Former    Types: Chew    Quit date: 06/03/2012  Vaping Use   Vaping status: Never Used  Substance and Sexual Activity   Alcohol use: Yes    Alcohol/week: 1.0 standard drink of alcohol    Types: 1 Cans of beer per week    Comment: Social   Drug use: Never   Sexual activity: Not on file  Other Topics Concern   Not on file  Social History Narrative   Lives at home with daughter.  Retired.  Education 12th grade.  Children 2.     Social Drivers of Corporate Investment Banker Strain: Not on file  Food Insecurity: Not on file  Transportation Needs: Not on file  Physical Activity: Not on file  Stress: Not on file  Social Connections: Not on file    Review of systems General: negative for malaise, night sweats, fever, chills, weight loss Neck: Negative for lumps, goiter, pain and significant neck swelling Resp: Negative for cough, wheezing, dyspnea at rest CV: Negative for chest pain, leg swelling, palpitations, orthopnea GI: denies melena, hematochezia, nausea, vomiting, diarrhea, constipation, dysphagia, odyonophagia, early satiety or unintentional weight loss. +reflux  The remainder of the review of systems is noncontributory.  Physical Exam: BP (!) 144/84   Pulse 72   Temp 97.6 F (36.4 C)   Ht 5' 9 (1.753 m)   Wt 166 lb (75.3 kg)   BMI 24.51 kg/m  General:   Alert and oriented. No distress noted. Pleasant and cooperative.  Head:  Normocephalic and atraumatic. Eyes:  Conjuctiva clear without scleral icterus. Mouth:  Oral mucosa pink and moist. Good dentition. No lesions. Heart: Normal rate and rhythm, s1 and s2 heart sounds present.  Lungs: Clear lung sounds in all lobes. Respirations equal and unlabored. Abdomen:  +BS, soft, non-tender and non-distended. No rebound or guarding. No HSM or masses noted. Neurologic:  Alert and  oriented x4 Psych:  Alert and cooperative. Normal mood and affect.  Invalid input(s): 6 MONTHS   ASSESSMENT: Dennis Miles is a  73 y.o. male presenting today for follow up of abdominal pain and dysphagia   History of EOE with most recent EGD in November 2024 as outlined above. Patient feels dysphagia is well-controlled at this time, did have some issues with larger antibiotics back in December but no issues with foods or liquids.  Also having some breakthrough reflux 2-3 times per week on pantoprazole  40 mg BID.  He has been on pantoprazole  for last few years.  At this time would recommend stopping pantoprazole  and start omeprazole  40 mg twice daily.  If symptoms persist despite changing PPI therapy he should February.  She continue with good reflux precautions to include being mindful of greasy, spicy, tomato/citrus based foods, caffeine, chocolate, alcohol, staying upright 2 to 3 hours after eating prior to laying down and avoiding eating late evenings.  Should continued to go to precautions   PLAN:  Good reflux precautions  2. Stop pantoprazole   3. Start omeprazole  40mg  BID 4. Good chewing precautions   All questions were answered, patient verbalized understanding and is in agreement with plan as outlined above.    Follow Up: 3-4 months   Gregg Winchell L. Sheza Strickland, MSN, APRN, AGNP-C Adult-Gerontology Nurse Practitioner [facility]  I have reviewed the note and agree with the APP's assessment as described in this progress note  If having persistent symptoms despite using medication compliantly, will need to repeat EGD. May consider adjuvant therapies for further improvement.  Toribio Fortune, MD Gastroenterology and Hepatology [facility]

## 2023-06-12 NOTE — Patient Instructions (Addendum)
 Stop pantoprazole   Start omeprazole  40mg  twice daily  Make sure you are chewing thoroughly Be mindful of  greasy, spicy, fried, citrus foods, caffeine, carbonated drinks, chocolate and alcohol can increase reflux symptoms Stay upright 2-3 hours after eating, prior to lying down and avoid eating late in the evenings.  Follow up 3-4 months   It was a pleasure to see you today. I want to create trusting relationships with patients and provide genuine, compassionate, and quality care. I truly value your feedback! please be on the lookout for a survey regarding your visit with me today. I appreciate your input about our visit and your time in completing this!    Trentyn Boisclair L. Atanacio Melnyk, MSN, APRN, AGNP-C Adult-Gerontology Nurse Practitioner [facility] at [facility]

## 2023-06-16 ENCOUNTER — Telehealth: Payer: Self-pay

## 2023-06-16 NOTE — Telephone Encounter (Signed)
 Auth Submission: NO AUTH NEEDED Site of care: Site of care: [facility] Payer: Medicare A/B with BCBS supplement Medication & CPT/J Code(s) submitted: Prolia (Denosumab) [id number] Route of submission (phone, fax, portal):  Phone # Fax # Auth type: Buy/Bill HB Units/visits requested: 60mg  x 2 doses Reference number:  Approval from: 06/16/23 to 07/03/24   Medicare A/B will cover 80%, BCBS supplement will cover the remaining 20%.

## 2023-06-18 ENCOUNTER — Ambulatory Visit (INDEPENDENT_AMBULATORY_CARE_PROVIDER_SITE_OTHER): Payer: Medicare Other | Admitting: Allergy & Immunology

## 2023-06-18 VITALS — BP 110/64 | HR 84 | Temp 98.3°F | Resp 14 | Ht 67.84 in | Wt 160.8 lb

## 2023-06-18 DIAGNOSIS — J3089 Other allergic rhinitis: Secondary | ICD-10-CM | POA: Diagnosis not present

## 2023-06-18 DIAGNOSIS — J302 Other seasonal allergic rhinitis: Secondary | ICD-10-CM | POA: Diagnosis not present

## 2023-06-18 DIAGNOSIS — K219 Gastro-esophageal reflux disease without esophagitis: Secondary | ICD-10-CM

## 2023-06-18 DIAGNOSIS — J0101 Acute recurrent maxillary sinusitis: Secondary | ICD-10-CM

## 2023-06-18 DIAGNOSIS — K200 Eosinophilic esophagitis: Secondary | ICD-10-CM | POA: Diagnosis not present

## 2023-06-18 MED ORDER — EPINEPHRINE 0.3 MG/0.3ML IJ SOAJ: 0.3000 mg | INTRAMUSCULAR | 1 refills | Status: AC | PRN

## 2023-06-18 MED ORDER — LEVOCETIRIZINE DIHYDROCHLORIDE 5 MG PO TABS: 5.0000 mg | ORAL_TABLET | Freq: Every day | ORAL | 3 refills | Status: DC | PRN

## 2023-06-18 MED ORDER — CEFDINIR 300 MG PO CAPS: 300.0000 mg | ORAL_CAPSULE | Freq: Two times a day (BID) | ORAL | 0 refills | Status: AC

## 2023-06-18 NOTE — Progress Notes (Signed)
FOLLOW UP  Date of Service/Encounter:  06/18/23   Assessment:   Seasonal and perennial allergic rhinitis (grasses, weeds, trees, indoor molds, outdoor molds, dust mites, cat, dog and cockroach) - on allergen immunotherapy with maintenance reached March 2023   Idiopathic gout - followed by Dr. Corliss Skains and controlled on allopurinol   Rightward septal deviation  EoE and GERD - with recent endoscopy showing 13 eosinophils per high power count  Plan/Recommendations:   Allergic rhinitis - with overlying sinusitis - Continue with allergy shots at the same schedule. - You reached maintenance in March 2023, so we will plan to continue for at least 3 years (March 2026) or up to 5 years (March 2028).  - Continue allergen immunotherapy per protocol and have access to an epinephrine autoinjector set. - Continue Xyzal 5 mg once a day as needed for runny nose or itch - Stop the Flonase 2 sprays in each nostril once a day as needed for a stuffy nose.  - Start Xhance one spray per nostril twice daily until the sample is used up (then switch back to [employer]).  - We are going to treat you with a longer course of antibiotics to see if this can take care of it: cefdinir twice daily for 14 days. - Call us in a couple of weeks to let us know if this is better.   2. Chest discomfort/ EOE/ Reflux - Continue omeprazole as previously prescribed - Continue to follow-up with Dr. Earmon Phoenix.  - Consider Dupixent for better control of the EoE.   3. Return in about 6 months (around 12/16/2023). You can have the follow up appointment with Dr. Dellis Anes or a Nurse Practicioner (our Nurse Practitioners are excellent and always have Physician oversight!).    Subjective:   Dennis Miles is a 73 y.o. male presenting today for follow up of  Chief Complaint  Patient presents with   Sinus Problem    Says he had a sinus infection the week of christmas and feels as if its coming back.     Dennis Miles has  a history of the following: Patient Active Problem List   Diagnosis Date Noted   Ischemic heart disease 01/16/2023   ASCVD (arteriosclerotic cardiovascular disease) 01/16/2023   History of colonic polyps 08/15/2022   S/P arthroscopy of right knee 12/25/2021   Acute lateral meniscus tear of right knee 11/21/2021   Seasonal and perennial allergic rhinitis 10/26/2021   Eosinophilic esophagitis 07/17/2021   Esophageal dysphagia 05/14/2021   Abdominal pain, epigastric 05/14/2021   IBS (irritable bowel syndrome) 11/13/2020   Primary insomnia 09/26/2016   DJD (degenerative joint disease), cervical 09/26/2016   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  Discussed the use of AI scribe software for clinical note transcription with the patient and/or guardian, who gave verbal consent to proceed.  Dennis Miles is a 73 y.o. male presenting for a sick visit.  He was last seen in May 2023 by Dennis Miles, one of our esteemed nurse practitioners.  At that time, he was continued on Xyzal as well as Flonase and allergy shots.  For his EOE and reflux, he was continued on pantoprazole.  Since last visit, he has largely done well.  Dennis Miles reports a recent episode of sinusitis  treated with a seven-day course of amoxicillin-clavulanic acid, which initially improved his symptoms but did not fully resolve them. He now feels like the sinusitis is returning. He describes the discomfort as facial pain, primarily around the eyeball and cheek. He denies dental pain. He is not having the postnasal drip and the purulent drainage, but the pressure is definitely there. He has not been febrile.  He has not been COVID-positive.  Regarding his eosinophilic  esophagitis, he has been treated with pantoprazole and recently switched to omeprazole BID. He reports persistent discomfort despite medication changes. They have never tried using Dupixent.  I did give him a note for this today.  Dennis Miles also has a history of a significant fall from scaffolding five years ago, resulting in facial trauma and a brief loss of consciousness. He reports no ongoing issues related to this injury.  Allergic Rhinitis Symptom History: He remains on allergy shots.  He is also on azelastine.  He does not use an oral antihistamine regularly.  He does use Flonase.  He has never been on Dennis Miles.  Dennis Miles is on allergen immunotherapy. He receives two injections. Immunotherapy script #1 contains trees, weeds, grasses, cat, and dog. He currently receives 0.64mL of the RED vial (1/100). Immunotherapy script #2 contains  ragweed, molds, and cockroach. He currently receives 0.46mL of the RED vial (1/100). He started shots October of 2022 and reached maintenance in March of 2023. He is tolerating these without a problem. He has never had anaphylaxis to these at all.  He denies large local reactions.  He does not like they are helping.  His gout is under good control.  He denies having any recent gout flares.  He has not had a flare since he retired.  He knows that stress is a major trigger for him.  Otherwise, there have been no changes to his past medical history, surgical history, family history, or social history.    Review of systems otherwise negative other than that mentioned in the HPI.    Objective:   Blood pressure 110/64, pulse 84, temperature 98.3 F (36.8 C), temperature source Temporal, resp. rate 14, height 5' 7.84" (1.723 m), weight 160 lb 12.8 oz (72.9 kg), SpO2 99%. Body mass index is 24.57 kg/m.    Physical Exam Vitals reviewed.  Constitutional:      Appearance: He is well-developed.     Comments: Pleasant.  Cooperative with the exam.  HENT:     Head:  Normocephalic and atraumatic.     Right Ear: Tympanic membrane, ear canal and external ear normal.     Left Ear: Tympanic membrane, ear canal and external ear normal.     Nose: No nasal deformity, septal deviation, mucosal edema or rhinorrhea.     Right Turbinates: Enlarged and swollen.     Left Turbinates: Enlarged and swollen.     Right Sinus: Maxillary sinus tenderness present. No frontal sinus tenderness.     Left Sinus: Maxillary sinus tenderness present. No frontal sinus tenderness.     Comments: Erythematous turbinates.    Mouth/Throat:     Mouth: Mucous membranes are not pale and not dry.     Pharynx: Uvula midline.  Eyes:     General: Lids are normal. No allergic shiner.       Right eye: No discharge.        Left eye: No discharge.     Conjunctiva/sclera: Conjunctivae normal.     Right eye: Right conjunctiva is not injected. No chemosis.  Left eye: Left conjunctiva is not injected. No chemosis.    Pupils: Pupils are equal, round, and reactive to light.  Cardiovascular:     Rate and Rhythm: Normal rate and regular rhythm.     Heart sounds: Normal heart sounds.  Pulmonary:     Effort: Pulmonary effort is normal. No tachypnea, accessory muscle usage or respiratory distress.     Breath sounds: Normal breath sounds. No wheezing, rhonchi or rales.  Chest:     Chest wall: No tenderness.  Lymphadenopathy:     Cervical: No cervical adenopathy.  Skin:    Coloration: Skin is not pale.     Findings: No abrasion, erythema, petechiae or rash. Rash is not papular, urticarial or vesicular.  Neurological:     Mental Status: He is alert.  Psychiatric:        Behavior: Behavior is cooperative.      Diagnostic studies: none     Malachi Bonds, MD  [facility]

## 2023-06-18 NOTE — Patient Instructions (Addendum)
 Allergic rhinitis - with overlying sinusitis - Continue with allergy shots at the same schedule. - You reached maintenance in March 2023, so we will plan to continue for at least 3 years (March 2026) or up to 5 years (March 2028).  - Continue allergen immunotherapy per protocol and have access to an epinephrine  autoinjector set. - Continue Xyzal  5 mg once a day as needed for runny nose or itch - Stop the Flonase  2 sprays in each nostril once a day as needed for a stuffy nose.  - Start Xhance  one spray per nostril twice daily until the sample is used up (then switch back to Flonase ).  - We are going to treat you with a longer course of antibiotics to see if this can take care of it: cefdinir  twice daily for 14 days. - Call us  in a couple of weeks to let us  know if this is better.   2. Chest discomfort/ EOE/ Reflux - Continue omeprazole  as previously prescribed - Continue to follow-up with Dr. Sherrell Dodrill.  - Consider Dupixent for better control of the EoE.   3. Return in about 6 months (around 12/16/2023). You can have the follow up appointment with Dr. Idolina Maker or a Nurse Practicioner (our Nurse Practitioners are excellent and always have Physician oversight!).    Please inform us  of any Emergency Department visits, hospitalizations, or changes in symptoms. Call us  before going to the ED for breathing or allergy symptoms since we might be able to fit you in for a sick visit. Feel free to contact us  anytime with any questions, problems, or concerns.  It was a pleasure to see you again today!  Websites that have reliable patient information: 1. American Academy of Asthma, Allergy, and Immunology: www.aaaai.org 2. Food Allergy Research and Education (FARE): foodallergy.org 3. Mothers of Asthmatics: http://www.asthmacommunitynetwork.org 4. American College of Allergy, Asthma, and Immunology: www.acaai.org      "Like" us  on Facebook and Instagram for our latest updates!      A healthy  democracy works best when [employer] participate! Make sure you are registered to vote! If you have moved or changed any of your contact information, you will need to get this updated before voting! Scan the QR codes below to learn more!

## 2023-06-20 ENCOUNTER — Encounter: Payer: Self-pay | Admitting: Allergy & Immunology

## 2023-06-23 ENCOUNTER — Telehealth: Payer: Self-pay

## 2023-06-23 NOTE — Telephone Encounter (Signed)
Called patient - DOB verified - stated he is feeling better.  Patient stated he feels like he's headed in the right direction - still has some congestion though but not as bad.  Forwarding updated message to provider.

## 2023-06-23 NOTE — Telephone Encounter (Signed)
-----   Message from Alfonse Spruce sent at 06/20/2023  8:21 AM EST ----- Can we call and see how he is feeling?

## 2023-07-02 ENCOUNTER — Encounter: Payer: Self-pay | Admitting: Allergy & Immunology

## 2023-07-02 ENCOUNTER — Telehealth: Payer: Self-pay | Admitting: *Deleted

## 2023-07-02 ENCOUNTER — Ambulatory Visit (INDEPENDENT_AMBULATORY_CARE_PROVIDER_SITE_OTHER): Payer: Medicare Other | Admitting: Allergy & Immunology

## 2023-07-02 VITALS — BP 140/88 | HR 81 | Temp 98.7°F | Resp 18

## 2023-07-02 DIAGNOSIS — J3089 Other allergic rhinitis: Secondary | ICD-10-CM

## 2023-07-02 DIAGNOSIS — J324 Chronic pansinusitis: Secondary | ICD-10-CM

## 2023-07-02 DIAGNOSIS — K200 Eosinophilic esophagitis: Secondary | ICD-10-CM | POA: Diagnosis not present

## 2023-07-02 DIAGNOSIS — J302 Other seasonal allergic rhinitis: Secondary | ICD-10-CM

## 2023-07-02 DIAGNOSIS — K219 Gastro-esophageal reflux disease without esophagitis: Secondary | ICD-10-CM

## 2023-07-02 MED ORDER — HYDROCOD POLI-CHLORPHE POLI ER 10-8 MG/5ML PO SUER: 5.0000 mL | Freq: Two times a day (BID) | ORAL | 0 refills | Status: DC | PRN

## 2023-07-02 NOTE — Telephone Encounter (Signed)
CT Maxillofacial w/o contrast PA...  CPT: 91478 ICD: J32.4 - Chronic Pansinusitis Location: [facility] NPI: [id number] ( [id number] is the Cancer Center not Hospital) Tax ID: [id number]  Called Medicare/[phone] -spoke to [place] - DOB verified - stated PA not required.  Called BCBS of [phone] - per recording PA not required for Medicare Supplement patients.  Called Centralized [phone] - spoke to [place] - advised [facility] is booked out until the end of March. Scheduled patient at [facility] instead.  Date: 07/21/2023 Site: [facility] Address: [address] [place], [place], [place] [address] Time: 1:30 pm Arrival: 1:00 pm  - go to Entrance A  - Valet parking is available for free Restrictions: NONE   Called patient - DOB verified - advised of above notation.  Patient wrote everything done - verbalized understanding, no further questions.  Forwarding updated message to provider.

## 2023-07-02 NOTE — Patient Instructions (Addendum)
Allergic rhinitis - with overlying chronic sinusitis - We are going to order a sinus CT since you have not responded to the two rounds of antibiotics without improvement. - We will send you to see a different ENT.  - We will send in the [facility] to see how much this would be for you. - Call us if this is too expensive for you.  - In the meantime, start the prescription cough medicine so we can help you get some sleep.  - We can restart shots once you are doing better.  - You reached maintenance in March 2023, so we will plan to continue for at least 3 years (March 2026) or up to 5 years (March 2028).  - Continue allergen immunotherapy per protocol and have access to an epinephrine autoinjector set. - Continue Xyzal 5 mg once a day as needed for runny nose or itch  2. Chest discomfort/ EOE/ Reflux - Continue omeprazole as previously prescribed - Continue to follow-up with Dr. Earmon Phoenix.  - Consider Dupixent for better control of the EoE.   3. Follow up as scheduled.    Please inform us of any Emergency Department visits, hospitalizations, or changes in symptoms. Call us before going to the ED for breathing or allergy symptoms since we might be able to fit you in for a sick visit. Feel free to contact us anytime with any questions, problems, or concerns.  It was a pleasure to see you again today!  Websites that have reliable patient information: 1. American Academy of Asthma, Allergy, and Immunology: www.aaaai.org 2. Food Allergy Research and Education (FARE): foodallergy.org 3. Mothers of Asthmatics: http://www.asthmacommunitynetwork.org 4. American College of Allergy, Asthma, and Immunology: www.acaai.org      "Like" [place] on Facebook and Instagram for our latest updates!      A healthy democracy works best when [employer] participate! Make sure you are registered to vote! If you have moved or changed any of your contact information, you will need to get this updated before voting!  Scan the QR codes below to learn more!

## 2023-07-02 NOTE — Progress Notes (Signed)
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 - We will send you to see a different ENT.  - We will send in the [facility] to see how much this would be for you. - Call us if this is too expensive for you.  - In the meantime, start the prescription cough medicine so we can help you get some sleep.  - We can restart shots once you are doing better.  - You reached maintenance in March 2023, so we will plan to continue for at least 3 years (March 2026) or up to 5 years (March 2028).  - Continue allergen immunotherapy per protocol and have access to an epinephrine autoinjector set. - Continue Xyzal 5 mg once a day as needed for runny nose or itch  2. Chest discomfort/ EOE/ Reflux - Continue omeprazole as previously prescribed - Continue to follow-up with Dr. Earmon Phoenix.  - Consider Dupixent for better control of the EoE.   3. Follow up as scheduled.    Subjective:   Dennis Miles is a 73 y.o. male presenting today for follow up of  Chief Complaint  Patient presents with   Cough    Dennis Miles has a history of the following: Patient Active Problem List   Diagnosis Date Noted   Ischemic heart disease 01/16/2023   ASCVD (arteriosclerotic  cardiovascular disease) 01/16/2023   History of colonic polyps 08/15/2022   S/P arthroscopy of right knee 12/25/2021   Acute lateral meniscus tear of right knee 11/21/2021   Seasonal and perennial allergic rhinitis 10/26/2021   Eosinophilic esophagitis 07/17/2021   Esophageal dysphagia 05/14/2021   Abdominal pain, epigastric 05/14/2021   IBS (irritable bowel syndrome) 11/13/2020   Primary insomnia 09/26/2016   DJD (degenerative joint disease), cervical 09/26/2016   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  Discussed the use of AI scribe software for clinical note transcription with the patient and/or guardian, who gave verbal consent to proceed.  Dennis Miles is a 73 y.o. male presenting for a follow up visit.  We last saw him in January 2025.  At that time, he was doing well with allergy shots.  We continue with Xyzal 5 mg daily.  We stopped the Flonase and started [place] instead.  We also treated him with another 2 weeks of cefdinir.  He was asked to call us in a couple weeks to let us know if he is any better.  He was continuing with omeprazole for his eosinophilic esophagitis and reflux.  Since the last visit, he continues to  report persistent sinus pain and pressure despite multiple treatments. Symptoms were exacerbated after being quiescent on Monday and yesterday. Although there have been a few quieter days, significant discomfort persists.  He has undergone multiple rounds of antibiotics, including cefdinir and amox-clavulanic acid, without relief. A nasal spray has been used for a couple of weeks, providing some help. Mucinex and dextromethorphan are also being used, which have slowed nasal discharge.  He has a  history of visiting Dr. Suszanne Conners multiple times, but no significant issues were identified. Over the last couple of months, he has not felt completely better and has experienced ear pain and a cough that causes chest pain and pressure.  Coughing has been severe enough to cause nausea and vomiting. They avoid codeine due to adverse reactions and have used a sleep test and an OTC cough medication.   They reported having a fever yesterday afternoon and express frustration with ongoing symptoms impacting daily life. He has not had a COVID test at home. He is fine with getting one here. He has not had a sinus CT recently.   Dennis Miles is on allergen immunotherapy. He receives two injections. Immunotherapy script #1 contains trees, weeds, grasses, cat, and dog. He currently receives 0.74mL of the RED vial (1/100). Immunotherapy script #2 contains  ragweed, molds, and cockroach. He currently receives 0.55mL of the RED vial (1/100). He started shots October of 2022 and reached maintenance in March of 2023. He is tolerating these without a problem. He has never had anaphylaxis to these at all.  He denies large local reactions.  He does not like they are helping.  Otherwise, there have been no changes to his past medical history, surgical history, family history, or social history.    Review of systems otherwise negative other than that mentioned in the HPI.    Objective:   Blood pressure (!) 140/88, pulse 81, temperature 98.7 F (37.1 C), resp. rate 18, SpO2 96%. There is no height or weight on file to calculate BMI.    Physical Exam Vitals reviewed.  Constitutional:      Appearance: He is well-developed.     Comments: Pleasant.  Cooperative with the exam.  HENT:     Head: Normocephalic and atraumatic.     Right Ear: Tympanic membrane, ear canal and external ear normal.     Left Ear: Tympanic membrane, ear canal and external ear normal.     Nose: No nasal deformity, septal deviation, mucosal edema or  rhinorrhea.     Right Turbinates: Enlarged, swollen and pale.     Left Turbinates: Enlarged, swollen and pale.     Right Sinus: Maxillary sinus tenderness present. No frontal sinus tenderness.     Left Sinus: Maxillary sinus tenderness present. No frontal sinus tenderness.     Comments: Erythematous turbinates. No polyps that I can appreciate.     Mouth/Throat:     Lips: Pink.     Mouth: Mucous membranes are moist. Mucous membranes are not pale and not dry.     Pharynx: Uvula midline.  Eyes:     General: Lids are normal. No allergic shiner.       Right eye: No discharge.        Left eye: No discharge.     Conjunctiva/sclera: Conjunctivae normal.     Right eye: Right conjunctiva is not injected. No chemosis.    Left eye: Left conjunctiva is not injected. No chemosis.    Pupils: Pupils are equal, round, and reactive to light.  Cardiovascular:     Rate and Rhythm: Normal rate and regular rhythm.     Heart sounds: Normal heart sounds.  Pulmonary:     Effort: Pulmonary effort is normal. No tachypnea, accessory muscle usage or respiratory distress.     Breath sounds: Normal breath sounds. Transmitted upper airway sounds present. No wheezing, rhonchi or rales.     Comments: Moving air well in all lung fields.  Chest:     Chest wall: No tenderness.  Lymphadenopathy:     Cervical: No cervical adenopathy.  Skin:    Coloration: Skin is not pale.     Findings: No abrasion, erythema, petechiae or rash. Rash is not papular, urticarial or vesicular.  Neurological:     Mental Status: He is alert.  Psychiatric:        Behavior: Behavior is cooperative.      Diagnostic studies: COVID rapid swab NEGATIVE with adequate control      Malachi Bonds, MD  [facility]

## 2023-07-02 NOTE — Telephone Encounter (Signed)
-----   Message from Alfonse Spruce sent at 07/02/2023 10:50 AM EST ----- Sinus CT ordered.

## 2023-07-02 NOTE — Telephone Encounter (Signed)
Attempted to call [employer] at [phone] to see if a Prior Authorization is needed with patients Medicare plan. Unfortunately there is an issue with the phone currently and there is a busy signal. Will need to attempt to try to call again to initiate Prior Authorization. CPT Code 29528, [facility] NPI [id number], Diagnosis Code for Chronic Pansinusitis J32.4.

## 2023-07-04 NOTE — Telephone Encounter (Signed)
 Great thank you!

## 2023-07-07 ENCOUNTER — Telehealth: Payer: Self-pay

## 2023-07-07 ENCOUNTER — Telehealth: Payer: Self-pay | Admitting: Allergy & Immunology

## 2023-07-07 MED ORDER — XHANCE 93 MCG/ACT NA EXHU: 1.0000 | INHALANT_SUSPENSION | Freq: Two times a day (BID) | NASAL | 5 refills | Status: DC

## 2023-07-07 NOTE — Telephone Encounter (Signed)
Patients referral has been placed to [facility]. Patient has been informed in person.

## 2023-07-07 NOTE — Telephone Encounter (Signed)
Patient came in today stating he needed his Xhance sent to a mail order pharmacy as stated in his last AVS.

## 2023-07-07 NOTE — Telephone Encounter (Signed)
Informed patient that prescription has been sent to the mail ordered pharmacy and they will reach out to set up delivery. Patient verbalized understanding.

## 2023-07-09 NOTE — Telephone Encounter (Signed)
 Received fax from [facility] for a Prior Authorization request on Xhance .

## 2023-07-09 NOTE — Telephone Encounter (Signed)
Prior Authorization form has been faxed to [employer] along with recent office visit notes. Forms have been placed in the red accordion folder at the [facility] office.

## 2023-07-16 NOTE — Telephone Encounter (Signed)
Forms have been placed to go to bulk scanning.

## 2023-07-17 DIAGNOSIS — M1A00X1 Idiopathic chronic gout, unspecified site, with tophus (tophi): Secondary | ICD-10-CM | POA: Diagnosis not present

## 2023-07-17 DIAGNOSIS — K200 Eosinophilic esophagitis: Secondary | ICD-10-CM | POA: Diagnosis not present

## 2023-07-17 DIAGNOSIS — F411 Generalized anxiety disorder: Secondary | ICD-10-CM | POA: Diagnosis not present

## 2023-07-17 DIAGNOSIS — J309 Allergic rhinitis, unspecified: Secondary | ICD-10-CM | POA: Diagnosis not present

## 2023-07-17 DIAGNOSIS — E782 Mixed hyperlipidemia: Secondary | ICD-10-CM | POA: Diagnosis not present

## 2023-07-21 ENCOUNTER — Ambulatory Visit (HOSPITAL_COMMUNITY)
Admission: RE | Admit: 2023-07-21 | Discharge: 2023-07-21 | Disposition: A | Discharge status: Home / Self Care | Payer: Medicare Other | Source: Ambulatory Visit | Attending: Allergy & Immunology | Admitting: Allergy & Immunology

## 2023-07-21 DIAGNOSIS — J342 Deviated nasal septum: Secondary | ICD-10-CM | POA: Diagnosis not present

## 2023-07-21 DIAGNOSIS — J324 Chronic pansinusitis: Secondary | ICD-10-CM | POA: Insufficient documentation

## 2023-07-25 ENCOUNTER — Telehealth: Payer: Self-pay | Admitting: Allergy & Immunology

## 2023-07-25 NOTE — Telephone Encounter (Signed)
 Patient called stating he is needing the results to his CT scan. Patient also states he can't afford Timmothy Sours his responsibility would be 300 dollars.

## 2023-07-29 NOTE — Telephone Encounter (Signed)
 His sinus CT is not read yet, but I looked through it and it looks like there might be some right maxillary mucosal thickening consistent with sinusitis. BUT I am going to wait for a radiologist to read it.   Regarding the Xhance, we can try changing to budesonide nasal rinses he wants to try that. Has he tried that yet?   Malachi Bonds, MD [facility]

## 2023-07-30 NOTE — Telephone Encounter (Signed)
 I called the patient and left a message for him to call the office back in regards to his nasal spray routine. Per Dr. Ellouise Newer previous note his Sinus CT is not ready yet.

## 2023-07-31 ENCOUNTER — Telehealth: Payer: Self-pay | Admitting: Allergy & Immunology

## 2023-07-31 NOTE — Telephone Encounter (Signed)
 I called the patient again today and left a message for him to call the office back to go over previous note by Dr. Dellis Anes.

## 2023-07-31 NOTE — Telephone Encounter (Signed)
 Patient returning call in reference to CT scan and change of medication

## 2023-07-31 NOTE — Telephone Encounter (Signed)
 I called the patient and went over the note by Dr. Dellis Anes. He states he is still feeling horrible. His sinuses have opened, he has a sore throat only on the right side, he has been coughing with yellow flem, and is tired all the time. I asked if he tested for covid or flu and he stated he was tested at his last visit in [place] and it was negative. I did inform him that we do not have any flu test at any of our office sites. He still said he was tested and it was negative.    Please advise on an alternative to the rinses he stated they make him sick after doing them. He also has been off his allergy shots per the patient 3 months and would like to get help so he can restart shots and feel better.

## 2023-07-31 NOTE — Telephone Encounter (Signed)
 SEE ORIGINAL NOTE

## 2023-08-06 ENCOUNTER — Telehealth: Payer: Self-pay | Admitting: Allergy & Immunology

## 2023-08-06 MED ORDER — FLUTICASONE PROPIONATE 50 MCG/ACT NA SUSP: 2.0000 | Freq: Two times a day (BID) | NASAL | 5 refills | Status: DC

## 2023-08-06 NOTE — Telephone Encounter (Signed)
 Spoke to patient and document in labs.

## 2023-08-06 NOTE — Telephone Encounter (Signed)
 I called the patient and went over the results from his scan and he does not feel that he needs an appointment at this time. He verbalized understanding that he is to use the Flonase 2 sprays twice a day. He also plans to get his allergy shot Friday at the [facility] office.

## 2023-08-06 NOTE — Telephone Encounter (Signed)
 Patient called asking for the Nurse to call him back he is still waiting for results from his CT scan stating he is not going forward with any new medications or shots until he gets results

## 2023-08-06 NOTE — Telephone Encounter (Signed)
 Let's go ahead and restart the shots.   I sent in Flonase and we will maximize the dose to two sprays per nostril twice daily.  Does he need to come in and see [place] for another visit?   Malachi Bonds, MD [facility]

## 2023-08-08 ENCOUNTER — Ambulatory Visit (INDEPENDENT_AMBULATORY_CARE_PROVIDER_SITE_OTHER): Payer: Self-pay

## 2023-08-08 DIAGNOSIS — J309 Allergic rhinitis, unspecified: Secondary | ICD-10-CM | POA: Diagnosis not present

## 2023-08-08 NOTE — Telephone Encounter (Signed)
Sounds good - thank you!   Salvatore Marvel, MD [facility]

## 2023-08-15 ENCOUNTER — Ambulatory Visit (INDEPENDENT_AMBULATORY_CARE_PROVIDER_SITE_OTHER): Payer: Self-pay

## 2023-08-15 DIAGNOSIS — J309 Allergic rhinitis, unspecified: Secondary | ICD-10-CM | POA: Diagnosis not present

## 2023-08-22 ENCOUNTER — Ambulatory Visit (INDEPENDENT_AMBULATORY_CARE_PROVIDER_SITE_OTHER): Payer: Self-pay

## 2023-08-22 DIAGNOSIS — J309 Allergic rhinitis, unspecified: Secondary | ICD-10-CM | POA: Diagnosis not present

## 2023-08-25 ENCOUNTER — Encounter (INDEPENDENT_AMBULATORY_CARE_PROVIDER_SITE_OTHER): Payer: Self-pay | Admitting: Otolaryngology

## 2023-08-25 ENCOUNTER — Ambulatory Visit (INDEPENDENT_AMBULATORY_CARE_PROVIDER_SITE_OTHER): Payer: Medicare Other | Admitting: Otolaryngology

## 2023-08-25 VITALS — BP 152/82 | HR 110 | Ht 70.0 in | Wt 170.0 lb

## 2023-08-25 DIAGNOSIS — K200 Eosinophilic esophagitis: Secondary | ICD-10-CM

## 2023-08-25 DIAGNOSIS — J342 Deviated nasal septum: Secondary | ICD-10-CM

## 2023-08-25 DIAGNOSIS — R0981 Nasal congestion: Secondary | ICD-10-CM

## 2023-08-25 DIAGNOSIS — J3089 Other allergic rhinitis: Secondary | ICD-10-CM

## 2023-08-25 DIAGNOSIS — J343 Hypertrophy of nasal turbinates: Secondary | ICD-10-CM

## 2023-08-25 DIAGNOSIS — J309 Allergic rhinitis, unspecified: Secondary | ICD-10-CM

## 2023-08-25 DIAGNOSIS — R0982 Postnasal drip: Secondary | ICD-10-CM

## 2023-08-25 DIAGNOSIS — K219 Gastro-esophageal reflux disease without esophagitis: Secondary | ICD-10-CM

## 2023-08-25 NOTE — Progress Notes (Signed)
 ENT CONSULT:  Reason for Consult: chronic sinusitis  symptoms x 5-6 mo  HPI: Discussed the use of AI scribe software for clinical note transcription with the patient, who gave verbal consent to proceed.  History of Present Illness Dennis Miles is a 73 year old male with chronic sinus issues who presents with persistent nasal symptoms including chronic nasal congestion and difficulties breathing through his nose.  He has been experiencing persistent nasal symptoms since before Christmas, characterized by chronic nasal congestion, particularly on the right side, dark green mucus, and a fever reaching nearly 102F for two days. He describes significant facial pressure, likening it to his 'eyeballs were gonna pop out', and notes dental pain. Initial treatment with amoxicillin provided some relief, but symptoms recurred. Further treatment included another antibiotic and Xhance nasal spray, which offered temporary relief. Currently, he experiences a runny nose, sneezing fits, and mucus that is 'somewhat clear' but occasionally yellowish on the right side. He also reports bloody mucus and continues to use Flonase, an allergy tablet, and receives allergy shots.  He underwent sinus surgery in his twenties, described as the 'old style' with facial incisions. He recalls a significant sinus infection with severe symptoms around the end of December, treated with 2 courses of abx, but no recent surgical interventions have been performed.  He has a history of eosinophilic esophagitis, diagnosed approximately two years ago, initially presenting with choking episodes. He underwent endoscopy and esophageal dilation in the past. Current treatment with Dupixent has improved his condition, and a recent rescope showed a decrease in eosinophil counts in bx specimen.  He reports a sore on the inside of his gum/jaw on the left side, which he associates with eating a tough steak with some trauma at the site that never  healed up. He has a history of chewing tobacco, which he no longer uses (recently quit).  No smoking history, but he has a history of chewing tobacco, which he no longer uses. He recalls a fall four to five years ago, landing on the side of his face, but no fractures were noted at that time.  Records Reviewed:  Allergy and Immunology 07/02/23  Seasonal and perennial allergic rhinitis (grasses, weeds, trees, indoor molds, outdoor molds, dust mites, cat, dog and cockroach) - on allergen immunotherapy with maintenance reached March 2023   Idiopathic gout - followed by Dr. Corliss Skains and controlled on allopurinol   Rightward septal deviation - has not had this surgically corrected   Chronic sinusitis - with transient improvement only with Augmentin and cefdinir (ordering sinus CT)   EoE and GERD - with recent endoscopy showing 13 eosinophils per high power count Allergic rhinitis - with overlying chronic sinusitis - We are going to order a sinus CT since you have not responded to the two rounds of antibiotics without improvement. - We will send you to see a different ENT.  - We will send in the [facility] to see how much this would be for you. - Call us if this is too expensive for you.  - In the meantime, start the prescription cough medicine so we can help you get some sleep.  - We can restart shots once you are doing better.  - You reached maintenance in March 2023, so we will plan to continue for at least 3 years (March 2026) or up to 5 years (March 2028).  - Continue allergen immunotherapy per protocol and have access to an epinephrine autoinjector set. - Continue Xyz al 5 mg once  a day as needed for runny nose or itch   2. Chest discomfort/ EOE/ Reflux - Continue omeprazole as previously prescribed - Continue to follow-up with Dr. Earmon Phoenix.  - Consider Dupixent for better control of the EoE.   Past Medical History:  Diagnosis Date   Anxiety    GERD (gastroesophageal reflux disease)     Gout    Headache(784.0)    Hiatal hernia    High cholesterol    Hypertension    Skin cancer     Past Surgical History:  Procedure Laterality Date   APPENDECTOMY     BALLOON DILATION  09/12/2022   Procedure: BALLOON DILATION;  Surgeon: Dolores Frame, MD;  [facility];  Service: Gastroenterology;;   BIOPSY  05/30/2021   Procedure: BIOPSY;  Surgeon: Malissa Hippo, MD;  [facility];  Service: Endoscopy;;   BIOPSY  09/12/2022   Procedure: BIOPSY;  Surgeon: Dolores Frame, MD;  [facility];  Service: Gastroenterology;;   BIOPSY  04/14/2023   Procedure: BIOPSY;  Surgeon: Marguerita Merles, Reuel Boom, MD;  [facility];  Service: Gastroenterology;;   CATARACT EXTRACTION Right    retinal procedure done at same time per patient   COLONOSCOPY N/A 10/23/2012   Procedure: COLONOSCOPY;  Surgeon: Malissa Hippo, MD;  [facility];  Service: Endoscopy;  Laterality: N/A;  235   COLONOSCOPY WITH PROPOFOL N/A 09/12/2022   Procedure: COLONOSCOPY WITH PROPOFOL;  Surgeon: Dolores Frame, MD;  [facility];  Service: Gastroenterology;  Laterality: N/A;  12:30PM; ASA 1   ESOPHAGEAL DILATION N/A 05/30/2021   Procedure: ESOPHAGEAL DILATION;  Surgeon: Malissa Hippo, MD;  [facility];  Service: Endoscopy;  Laterality: N/A;   ESOPHAGOGASTRODUODENOSCOPY  04/17/2011   Procedure: ESOPHAGOGASTRODUODENOSCOPY (EGD);  Surgeon: Malissa Hippo, MD;  [facility];  Service: Endoscopy;  Laterality: N/A;  8:30   ESOPHAGOGASTRODUODENOSCOPY (EGD) WITH PROPOFOL N/A 05/30/2021   Procedure: ESOPHAGOGASTRODUODENOSCOPY (EGD) WITH PROPOFOL;  Surgeon: Malissa Hippo, MD;  [facility];  Service: Endoscopy;  Laterality: N/A;  7:30   ESOPHAGOGASTRODUODENOSCOPY (EGD) WITH PROPOFOL N/A 09/12/2022   Procedure: ESOPHAGOGASTRODUODENOSCOPY (EGD) WITH PROPOFOL;  Surgeon: Dolores Frame,  MD;  [facility];  Service: Gastroenterology;  Laterality: N/A;  12:30PM;ASA 1   ESOPHAGOGASTRODUODENOSCOPY (EGD) WITH PROPOFOL N/A 04/14/2023   Procedure: ESOPHAGOGASTRODUODENOSCOPY (EGD) WITH PROPOFOL;  Surgeon: Dolores Frame, MD;  [facility];  Service: Gastroenterology;  Laterality: N/A;  8:45AM;ASA 1-2   EYE SURGERY     HAND SURGERY  05/28/2016   INGUINAL HERNIA REPAIR Left 02/17/2013   Procedure: HERNIA REPAIR INGUINAL ADULT;  Surgeon: Dalia Heading, MD;  Location: [facility];  Service: General;  Laterality: Left;   INSERTION OF MESH Left 02/17/2013   Procedure: INSERTION OF MESH;  Surgeon: Dalia Heading, MD;  Location: [facility];  Service: General;  Laterality: Left;   KNEE ARTHROSCOPY W/ MENISCAL REPAIR Right 11/2021   POLYPECTOMY  09/12/2022   Procedure: POLYPECTOMY INTESTINAL;  Surgeon: Dolores Frame, MD;  [facility];  Service: Gastroenterology;;   SINOSCOPY     sinus sugery     SKIN CANCER EXCISION     x4   SQUAMOUS CELL CARCINOMA EXCISION Left 2023   forearm   SQUAMOUS CELL CARCINOMA EXCISION  05/05/2023   on scalp, per patient    Family History  Problem Relation Age of Onset   Aneurysm Father  Asthma Child    Colon cancer Neg Hx     Social History:  reports that he has never smoked. He has never been exposed to tobacco smoke. He quit smokeless tobacco use about 11 years ago.  His smokeless tobacco use included chew. He reports current alcohol use of about 1.0 standard drink of alcohol per week. He reports that he does not use drugs.  Allergies:  Allergies  Allergen Reactions   Codeine Nausea Only   Crestor [Rosuvastatin] Other (See Comments)    myalgias   Lipitor [Atorvastatin] Other (See Comments)    Myalgias    Zetia [Ezetimibe] Other (See Comments)    myalgias    Medications: I have reviewed the patient's current medications.  The PMH, PSH, Medications, Allergies, and SH were reviewed and  updated.  ROS: Constitutional: Negative for fever, weight loss and weight gain. Cardiovascular: Negative for chest pain and dyspnea on exertion. Respiratory: Is not experiencing shortness of breath at rest. Gastrointestinal: Negative for nausea and vomiting. Neurological: Negative for headaches. Psychiatric: The patient is not nervous/anxious  Blood pressure (!) 152/82, pulse (!) 110, height 5\' 10"  (1.778 m), weight 170 lb (77.1 kg), SpO2 98%. Body mass index is 24.39 kg/m.  PHYSICAL EXAM:  Exam: General: Well-developed, well-nourished Respiratory Respiratory effort: Equal inspiration and expiration without stridor Cardiovascular Peripheral Vascular: Warm extremities with equal color/perfusion Eyes: No nystagmus with equal extraocular motion bilaterally Neuro/Psych/Balance: Patient oriented to person, place, and time; Appropriate mood and affect; Gait is intact with no imbalance; Cranial nerves I-XII are intact Head and Face Inspection: Normocephalic and atraumatic without mass or lesion Palpation: Facial skeleton intact without bony stepoffs Salivary Glands: No mass or tenderness Facial Strength: Facial motility symmetric and full bilaterally ENT Pinna: External ear intact and fully developed External canal: Canal is patent with intact skin Tympanic Membrane: Clear and mobile External Nose: No scar or anatomic deformity Internal Nose: Septum is deviated to the left. No polyp, or purulence. Mucosal edema and erythema present.  Bilateral inferior turbinate hypertrophy.  Lips, Teeth, and gums: Mucosa and teeth intact and viable TMJ: No pain to palpation with full mobility Oral cavity/oropharynx: No erythema or exudate, no lesions present Nasopharynx: No mass or lesion with intact mucosa Neck Neck and Trachea: Midline trachea without mass or lesion Thyroid: No mass or nodularity Lymphatics: No lymphadenopathy  Procedure:   PROCEDURE NOTE: nasal endoscopy  Preoperative  diagnosis: chronic sinusitis symptoms  Postoperative diagnosis: same  Procedure: Diagnostic nasal endoscopy (16109)  Surgeon: Ashok Croon, M.D.  Anesthesia: Topical lidocaine and Afrin  H&P REVIEW: The patient's history and physical were reviewed today prior to procedure. All medications were reviewed and updated as well. Complications: None Condition is stable throughout exam Indications and consent: The patient presents with symptoms of chronic sinusitis not responding to previous therapies. All the risks, benefits, and potential complications were reviewed with the patient preoperatively and informed consent was obtained. The time out was completed with confirmation of the correct procedure.   Procedure: The patient was seated upright in the clinic. Topical lidocaine and Afrin were applied to the nasal cavity. After adequate anesthesia had occurred, the rigid nasal endoscope was passed into the nasal cavity. The nasal mucosa, turbinates, septum, and sinus drainage pathways were visualized bilaterally. This revealed no purulence or significant secretions that might be cultured. There were no polyps or sites of significant inflammation. The mucosa was intact and there was no crusting present. The scope was then slowly withdrawn and the patient tolerated the  procedure well. There were no complications or blood loss.      Studies Reviewed: CT max face 07/21/2023 FINDINGS: Paranasal sinuses:   Frontal: Normally aerated. Patent frontal sinus drainage pathways.   Ethmoid: Normally aerated.   Maxillary: Prior right maxillary antrostomy/uncinectomy. As before, the right maxillary sinus is small as compared to the left. Redemonstrated small chronic bony defect within the anterior wall the right maxillary sinus (series 3, image 48). Mild mucosal thickening within the right maxillary sinus (with associated chronic reactive osteitis). The left maxillary sinus is normally aerated.    Sphenoid: Normally aerated. Patent sphenoethmoidal recesses.   Right ostiomeatal unit: The surgical ostium is narrowed by mucosal thickening (series 5, image 47).   Left ostiomeatal unit: Patent.   Nasal passages: Leftward deviation of the bony nasal septum posteriorly with leftward bony spurring. Bilateral concha bullosa. Minimal mucosal thickening within the bilateral nasal passages.   Anatomy: Pneumatization is present superior to the anterior ethmoid notch on the left. Symmetric and intact olfactory grooves and fovea ethmoidalis, Keros II (4-70mm). Sellar sphenoid pneumatization pattern.   Other: Periapical lucency surrounding the left maxillary first molar tooth (consistent with periodontal disease) (series 6, image 51).   IMPRESSION: 1. Prior right maxillary antrostomy/uncinectomy. As before, the right maxillary sinus is small as compared to the left. Mild mucosal thickening within the right maxillary sinus (with associated chronic reactive osteitis). The right maxillary sinus surgical ostium is narrowed by mucosal thickening. 2. The paranasal sinuses are otherwise normally aerated, and the sinus drainage pathways are otherwise patent. 3. Leftward deviation of the bony nasal septum posteriorly (with leftward bony spurring). 4. Bilateral concha bullosa. 5. Minor mucosal thickening within the bilateral nasal passages. 6. Periapical lucency surrounding the left maxillary first molar tooth (consistent with periodontal disease).  Assessment/Plan: Encounter Diagnoses  Name Primary?   Environmental and seasonal allergies Yes   Hypertrophy of both inferior nasal turbinates    Nasal septal deviation    Chronic nasal congestion    Chronic GERD    Eosinophilic esophagitis    Post-nasal drip    Allergic rhinitis, unspecified seasonality, unspecified trigger     Assessment and Plan Assessment & Plan Chronic Nasal Congestion and sx of sinus infection, does not appear to get  completely resolved, s/p 2 courses of abx.   CT sinuses shows clear sinuses, minor mucosal thickening R maxillary, deviated septum, hypertrophy of left turbinate and periapical lucency left maxillary molar. Septoplasty/ITR discussed for nasal passage improvement, not for allergy symptoms. Nasal endoscopy w/o pus or polyps. - Perform nasal endoscopy to assess blockage and pus. - Continue allergy management with allergist. - Consider septoplasty/ITR for nasal obstruction if symptoms persist.  Environmental allergies  - continue Xhance  - continue Xyzal and allergy shots   Eosinophilic Esophagitis (EoE) EoE managed with Dupixent. Symptoms improved, eosinophil count reduced. Choking episodes resolved. - continue to f/u with GI - continue Dupixent  Periodontal Disease on CT sinuses  Radiological evidence of periodontal disease in left upper first molar. Possible low-grade infection suggested by lucency. - Refer to dentist for evaluation and management.  Oral Lesion Sore with what appears to be exposed bone along the lingual aspect of the left posterior mandible, unclear etiology but due to hx of chewing tobacco will monitor, and re-evaluate in 4 months, if persists will consider biopsy. Will have his dentist take a look at it as well. CT did not show any irregularities along the mandible in the area of concern and no lymphadenopathy.  -  Refer to dentist for evaluation. - Schedule follow-up in four months. - Consider biopsy if lesion persists and diagnosis is uncertain.   Thank you for allowing me to participate in the care of this patient. Please do not hesitate to contact me with any questions or concerns.   Ashok Croon, MD Otolaryngology [facility] Phone: [phone] Fax: [phone]    08/25/2023, 5:41 PM

## 2023-09-17 ENCOUNTER — Ambulatory Visit (INDEPENDENT_AMBULATORY_CARE_PROVIDER_SITE_OTHER): Payer: Self-pay

## 2023-09-17 DIAGNOSIS — J309 Allergic rhinitis, unspecified: Secondary | ICD-10-CM

## 2023-10-01 DIAGNOSIS — E782 Mixed hyperlipidemia: Secondary | ICD-10-CM | POA: Diagnosis not present

## 2023-10-01 DIAGNOSIS — R7301 Impaired fasting glucose: Secondary | ICD-10-CM | POA: Diagnosis not present

## 2023-10-01 DIAGNOSIS — Z125 Encounter for screening for malignant neoplasm of prostate: Secondary | ICD-10-CM | POA: Diagnosis not present

## 2023-10-08 DIAGNOSIS — I10 Essential (primary) hypertension: Secondary | ICD-10-CM | POA: Diagnosis not present

## 2023-10-08 DIAGNOSIS — I2510 Atherosclerotic heart disease of native coronary artery without angina pectoris: Secondary | ICD-10-CM | POA: Diagnosis not present

## 2023-10-08 DIAGNOSIS — G5600 Carpal tunnel syndrome, unspecified upper limb: Secondary | ICD-10-CM | POA: Diagnosis not present

## 2023-10-08 DIAGNOSIS — E782 Mixed hyperlipidemia: Secondary | ICD-10-CM | POA: Diagnosis not present

## 2023-10-08 DIAGNOSIS — M1A00X1 Idiopathic chronic gout, unspecified site, with tophus (tophi): Secondary | ICD-10-CM | POA: Diagnosis not present

## 2023-10-08 DIAGNOSIS — J309 Allergic rhinitis, unspecified: Secondary | ICD-10-CM | POA: Diagnosis not present

## 2023-10-08 DIAGNOSIS — G4700 Insomnia, unspecified: Secondary | ICD-10-CM | POA: Diagnosis not present

## 2023-10-08 DIAGNOSIS — K200 Eosinophilic esophagitis: Secondary | ICD-10-CM | POA: Diagnosis not present

## 2023-10-08 DIAGNOSIS — K2100 Gastro-esophageal reflux disease with esophagitis, without bleeding: Secondary | ICD-10-CM | POA: Diagnosis not present

## 2023-10-08 DIAGNOSIS — G248 Other dystonia: Secondary | ICD-10-CM | POA: Diagnosis not present

## 2023-10-08 DIAGNOSIS — F411 Generalized anxiety disorder: Secondary | ICD-10-CM | POA: Diagnosis not present

## 2023-10-08 DIAGNOSIS — R7301 Impaired fasting glucose: Secondary | ICD-10-CM | POA: Diagnosis not present

## 2023-10-09 DIAGNOSIS — D2271 Melanocytic nevi of right lower limb, including hip: Secondary | ICD-10-CM | POA: Diagnosis not present

## 2023-10-09 DIAGNOSIS — W57XXXA Bitten or stung by nonvenomous insect and other nonvenomous arthropods, initial encounter: Secondary | ICD-10-CM | POA: Diagnosis not present

## 2023-10-09 DIAGNOSIS — L309 Dermatitis, unspecified: Secondary | ICD-10-CM | POA: Diagnosis not present

## 2023-10-09 DIAGNOSIS — L578 Other skin changes due to chronic exposure to nonionizing radiation: Secondary | ICD-10-CM | POA: Diagnosis not present

## 2023-10-09 DIAGNOSIS — D225 Melanocytic nevi of trunk: Secondary | ICD-10-CM | POA: Diagnosis not present

## 2023-10-09 DIAGNOSIS — L570 Actinic keratosis: Secondary | ICD-10-CM | POA: Diagnosis not present

## 2023-10-09 DIAGNOSIS — Z85828 Personal history of other malignant neoplasm of skin: Secondary | ICD-10-CM | POA: Diagnosis not present

## 2023-10-09 DIAGNOSIS — D2272 Melanocytic nevi of left lower limb, including hip: Secondary | ICD-10-CM | POA: Diagnosis not present

## 2023-10-09 DIAGNOSIS — L814 Other melanin hyperpigmentation: Secondary | ICD-10-CM | POA: Diagnosis not present

## 2023-10-09 DIAGNOSIS — S40862A Insect bite (nonvenomous) of left upper arm, initial encounter: Secondary | ICD-10-CM | POA: Diagnosis not present

## 2023-10-09 DIAGNOSIS — L821 Other seborrheic keratosis: Secondary | ICD-10-CM | POA: Diagnosis not present

## 2023-10-13 ENCOUNTER — Encounter (INDEPENDENT_AMBULATORY_CARE_PROVIDER_SITE_OTHER): Payer: Self-pay | Admitting: Gastroenterology

## 2023-10-13 ENCOUNTER — Ambulatory Visit (INDEPENDENT_AMBULATORY_CARE_PROVIDER_SITE_OTHER): Payer: Medicare Other | Admitting: Gastroenterology

## 2023-10-13 VITALS — BP 138/80 | HR 80 | Temp 97.5°F | Ht 70.0 in | Wt 165.2 lb

## 2023-10-13 DIAGNOSIS — R1310 Dysphagia, unspecified: Secondary | ICD-10-CM

## 2023-10-13 DIAGNOSIS — K200 Eosinophilic esophagitis: Secondary | ICD-10-CM

## 2023-10-13 DIAGNOSIS — R1319 Other dysphagia: Secondary | ICD-10-CM

## 2023-10-13 DIAGNOSIS — K219 Gastro-esophageal reflux disease without esophagitis: Secondary | ICD-10-CM

## 2023-10-13 DIAGNOSIS — K2100 Gastro-esophageal reflux disease with esophagitis, without bleeding: Secondary | ICD-10-CM

## 2023-10-13 NOTE — Progress Notes (Addendum)
 Referring Provider: Omie Bickers, MD Primary Care Physician:  Omie Bickers, MD Primary GI Physician: Dr. Sammi Crick   Chief Complaint  Patient presents with   Gastroesophageal Reflux    Follow up on GERD. Still having some chest discomfort at times. Has not noticed any improvement since changing to omeprazole .    HPI:   Dennis Miles is a 73 y.o. male with past medical history of GERD, EOE, anxiety, HTN, gout, skin cancer   Patient presenting today for:  Follow up of GERD/dysphagia/EOE  Last seen January 2025, at that time, had some dysphagia around christmas with larger pills, sensation of phlegm in his throat, given antibiotics and steroids with improvement. No current dysphagia. Some reflux 2-3 times per week. Taking tums for breakthrough. Occasional diarrhea  Recommended stop pantoprazole , start omeprazole  40mg  BID, good reflux and chewing precautions  Present:  Still having some issues with dysphagia with larger pills. Some chest discomfort though somewhat improved from previously. He is having 3-4 episodes per week noticing some pressure in his upper chest. He has had a few episodes of where pills have gotten hung up in his throat. No issues with swallowing foods. Denies any heartburn or acid coming up in his throat. He also queries if he has some nasal draining worsening some of his symptoms. Belching sometimes help his chest pressure. He is taking omeprazole  BID. His Otolaryngologist recommended considering dupixent for his EOE and his ongoing allergy issues.    Patient denies melena, hematochezia, nausea, vomiting, diarrhea, constipation, odyonophagia, early satiety or weight loss.   Last EGD: 04/2023- Esophageal mucosal changes.                           - Benign-appearing esophageal stenosis. Dilated.                           - Normal stomach.                           - Normal examined duodenum.                           - Biopsies were taken with a cold forceps for                             evaluation of eosinophilic esophagitis.  A. ESOPHAGUS, DISTAL, BIOPSY: Squamous mucosa with reflux changes and increased intraepithelial eosinophils. Greater than 20 per high-power field.   B. ESOPHAGUS, MID, BIOPSY: Squamous mucosa with focally increased intraepithelial eosinophils. Focally up to 13 per high-power field.   Last Colonoscopy: 09/12/2022 5 mm polyp in the descending colon, internal hemorrhoids. Pathology consistent with a hyperplastic polyp   Repeat colonoscopy in 10 years. Filed Weights   10/13/23 0946  Weight: 165 lb 3.2 oz (74.9 kg)     Past Medical History:  Diagnosis Date   Anxiety    GERD (gastroesophageal reflux disease)    Gout    Headache(784.0)    Hiatal hernia    High cholesterol    Hypertension    Skin cancer     Past Surgical History:  Procedure Laterality Date   APPENDECTOMY     BALLOON DILATION  09/12/2022   Procedure: BALLOON DILATION;  Surgeon: Urban Garden, MD;  [facility];  Service: Gastroenterology;;   BIOPSY  05/30/2021   Procedure: BIOPSY;  Surgeon: Ruby Corporal, MD;  [facility];  Service: Endoscopy;;   BIOPSY  09/12/2022   Procedure: BIOPSY;  Surgeon: Urban Garden, MD;  [facility];  Service: Gastroenterology;;   BIOPSY  04/14/2023   Procedure: BIOPSY;  Surgeon: Umberto Ganong, Bearl Limes, MD;  [facility];  Service: Gastroenterology;;   CATARACT EXTRACTION Right    retinal procedure done at same time per patient   COLONOSCOPY N/A 10/23/2012   Procedure: COLONOSCOPY;  Surgeon: Ruby Corporal, MD;  [facility];  Service: Endoscopy;  Laterality: N/A;  235   COLONOSCOPY WITH PROPOFOL  N/A 09/12/2022   Procedure: COLONOSCOPY WITH PROPOFOL ;  Surgeon: Urban Garden, MD;  [facility];  Service: Gastroenterology;  Laterality: N/A;  12:30PM; ASA 1   ESOPHAGEAL DILATION N/A 05/30/2021   Procedure:  ESOPHAGEAL DILATION;  Surgeon: Ruby Corporal, MD;  [facility];  Service: Endoscopy;  Laterality: N/A;   ESOPHAGOGASTRODUODENOSCOPY  04/17/2011   Procedure: ESOPHAGOGASTRODUODENOSCOPY (EGD);  Surgeon: Ruby Corporal, MD;  [facility];  Service: Endoscopy;  Laterality: N/A;  8:30   ESOPHAGOGASTRODUODENOSCOPY (EGD) WITH PROPOFOL  N/A 05/30/2021   Procedure: ESOPHAGOGASTRODUODENOSCOPY (EGD) WITH PROPOFOL ;  Surgeon: Ruby Corporal, MD;  [facility];  Service: Endoscopy;  Laterality: N/A;  7:30   ESOPHAGOGASTRODUODENOSCOPY (EGD) WITH PROPOFOL  N/A 09/12/2022   Procedure: ESOPHAGOGASTRODUODENOSCOPY (EGD) WITH PROPOFOL ;  Surgeon: Urban Garden, MD;  [facility];  Service: Gastroenterology;  Laterality: N/A;  12:30PM;ASA 1   ESOPHAGOGASTRODUODENOSCOPY (EGD) WITH PROPOFOL  N/A 04/14/2023   Procedure: ESOPHAGOGASTRODUODENOSCOPY (EGD) WITH PROPOFOL ;  Surgeon: Urban Garden, MD;  [facility];  Service: Gastroenterology;  Laterality: N/A;  8:45AM;ASA 1-2   EYE SURGERY     HAND SURGERY  05/28/2016   INGUINAL HERNIA REPAIR Left 02/17/2013   Procedure: HERNIA REPAIR INGUINAL ADULT;  Surgeon: Beau Bound, MD;  Location: [facility];  Service: General;  Laterality: Left;   INSERTION OF MESH Left 02/17/2013   Procedure: INSERTION OF MESH;  Surgeon: Beau Bound, MD;  Location: [facility];  Service: General;  Laterality: Left;   KNEE ARTHROSCOPY W/ MENISCAL REPAIR Right 11/2021   POLYPECTOMY  09/12/2022   Procedure: POLYPECTOMY INTESTINAL;  Surgeon: Urban Garden, MD;  [facility];  Service: Gastroenterology;;   SINOSCOPY     sinus sugery     SKIN CANCER EXCISION     x4   SQUAMOUS CELL CARCINOMA EXCISION Left 2023   forearm   SQUAMOUS CELL CARCINOMA EXCISION  05/05/2023   on scalp, per patient    Current Outpatient Medications  Medication Sig Dispense Refill   allopurinol  (ZYLOPRIM ) 300 MG tablet Take  1.5 tablets (450 mg total) by mouth daily. 135 tablet 0   Coenzyme Q10 (CO Q 10) 100 MG CAPS Take 100 mg by mouth daily.     cyclobenzaprine (FLEXERIL) 5 MG tablet Take 5 mg by mouth daily as needed for muscle spasms (pinched nerve).     EPINEPHRINE  0.3 mg/0.3 mL IJ SOAJ injection Inject 0.3 mg into the muscle as needed for anaphylaxis. 2 each 1   fluticasone  (FLONASE ) 50 MCG/ACT nasal spray Place 2 sprays into both nostrils in the morning and at bedtime. 16 g 5   INCLISIRAN SODIUM  [place] Inject into the skin. Received at [facility]     levocetirizine (XYZAL ) 5 MG tablet  Take 1 tablet (5 mg total) by mouth daily as needed for allergies (Can take an extra dose during flare ups.). 180 tablet 3   lisinopril (PRINIVIL,ZESTRIL) 10 MG tablet Take 10 mg by mouth daily.     omeprazole  (PRILOSEC) 40 MG capsule Take 1 capsule (40 mg total) by mouth 2 (two) times daily. 60 capsule 3   PRESCRIPTION MEDICATION Allergy injections at the allergist office     zolpidem (AMBIEN) 10 MG tablet Take 10 mg by mouth at bedtime.     No current facility-administered medications for this visit.    Allergies as of 10/13/2023 - Review Complete 10/13/2023  Allergen Reaction Noted   Codeine Nausea Only 04/01/2011   Crestor  [rosuvastatin ] Other (See Comments) 07/26/2021   Lipitor [atorvastatin] Other (See Comments) 07/26/2021   Zetia [ezetimibe] Other (See Comments) 07/26/2021    Social History   Socioeconomic History   Marital status: Divorced    Spouse name: Not on file   Number of children: Not on file   Years of education: Not on file   Highest education level: Not on file  Occupational History   Not on file  Tobacco Use   Smoking status: Never    Passive exposure: Never   Smokeless tobacco: Former    Types: Chew    Quit date: 06/03/2012  Vaping Use   Vaping status: Never Used  Substance and Sexual Activity   Alcohol use: Yes    Alcohol/week: 1.0 standard drink of alcohol    Types: 1 Cans of beer  per week    Comment: Social   Drug use: Never   Sexual activity: Not on file  Other Topics Concern   Not on file  Social History Narrative   Lives at home with daughter.  Retired.  Education 12th grade.  Children 2.     Social Drivers of Corporate investment banker Strain: Not on file  Food Insecurity: Not on file  Transportation Needs: Not on file  Physical Activity: Not on file  Stress: Not on file  Social Connections: Not on file    Review of systems General: negative for malaise, night sweats, fever, chills, weight loss Neck: Negative for lumps, goiter, pain and significant neck swelling Resp: Negative for cough, wheezing, dyspnea at rest CV: Negative for chest pain, leg swelling, palpitations, orthopnea GI: denies melena, hematochezia, nausea, vomiting, diarrhea, constipation, odyonophagia, early satiety or unintentional weight loss. +dysphagia  The remainder of the review of systems is noncontributory.  Physical Exam: BP 138/80   Pulse 80   Temp (!) 97.5 F (36.4 C)   Ht 5\' 10"  (1.778 m)   Wt 165 lb 3.2 oz (74.9 kg)   BMI 23.70 kg/m  General:   Alert and oriented. No distress noted. Pleasant and cooperative.  Head:  Normocephalic and atraumatic. Eyes:  Conjuctiva clear without scleral icterus. Mouth:  Oral mucosa pink and moist. Good dentition. No lesions. Heart: Normal rate and rhythm, s1 and s2 heart sounds present.  Lungs: Clear lung sounds in all lobes. Respirations equal and unlabored. Abdomen:  +BS, soft, non-tender and non-distended. No rebound or guarding. No HSM or masses noted. Neurologic:  Alert and  oriented x4 Psych:  Alert and cooperative. Normal mood and affect.  Invalid input(s): "6 MONTHS"   ASSESSMENT: Dennis Miles is a 73 y.o. male presenting today for follow up of GERD, EOE and dysphagia  History of EOE with most recent EGD in November 2024 as outlined above. Patient feels dysphagia only  occurring with larger pills. Reflux symptoms  improved with switch to omeprazole , though reports ongoing chest pressure 3-4 times per week usually improved with belching. We discussed repeat EGD to evaluate for worsening/persistent EOE, as we could consider Dupixent for treatment given his symptoms have been refractory to PPI therapy, though at this time he does not wish to pursue EGD. He will make me aware if symptoms worsen or if he wishes to pursue EGD. Will continue with PPI BID, chewing precautions and can take larger pills with applesauce or yogurt.    PLAN:  -continue omeprazole  40mg  BID  -good reflux precautions and chewing precautions -pt to make me aware if he wishes to repeat EGD -consider dupixent if repeat EGD shows persistent EOE   All questions were answered, patient verbalized understanding and is in agreement with plan as outlined above.    Follow Up: 4 months   Elma Limas L. Adrien Alberta, MSN, APRN, AGNP-C Adult-Gerontology Nurse Practitioner [facility]  I have reviewed the note and agree with the APP's assessment as described in this progress note  Samantha Cress, MD Gastroenterology and Hepatology [facility]

## 2023-10-13 NOTE — Patient Instructions (Signed)
 Please continue omeprazole  40 mg twice a day Continue to take small bites, chew thoroughly, take sips of liquids between bites, take larger pills with applesauce or yogurt Please make me aware if swallowing worsens or you wish to proceed with upper endoscopy as discussed  Follow-up 4 months  It was a pleasure to see you today. I want to create trusting relationships with patients and provide genuine, compassionate, and quality care. I truly value your feedback! please be on the lookout for a survey regarding your visit with me today. I appreciate your input about our visit and your time in completing this!    Denzil Bristol L. Rex Oesterle, MSN, APRN, AGNP-C Adult-Gerontology Nurse Practitioner [facility] Gastroenterology at [facility]

## 2023-10-15 ENCOUNTER — Ambulatory Visit (INDEPENDENT_AMBULATORY_CARE_PROVIDER_SITE_OTHER)

## 2023-10-15 DIAGNOSIS — J309 Allergic rhinitis, unspecified: Secondary | ICD-10-CM | POA: Diagnosis not present

## 2023-10-22 ENCOUNTER — Telehealth: Payer: Self-pay | Admitting: Gastroenterology

## 2023-10-22 ENCOUNTER — Other Ambulatory Visit (INDEPENDENT_AMBULATORY_CARE_PROVIDER_SITE_OTHER): Payer: Self-pay

## 2023-10-22 ENCOUNTER — Encounter: Payer: Medicare Other | Attending: Internal Medicine | Admitting: *Deleted

## 2023-10-22 ENCOUNTER — Telehealth: Payer: Self-pay

## 2023-10-22 VITALS — BP 130/83 | HR 90 | Temp 97.7°F

## 2023-10-22 DIAGNOSIS — I2510 Atherosclerotic heart disease of native coronary artery without angina pectoris: Secondary | ICD-10-CM | POA: Diagnosis not present

## 2023-10-22 DIAGNOSIS — E782 Mixed hyperlipidemia: Secondary | ICD-10-CM | POA: Diagnosis not present

## 2023-10-22 DIAGNOSIS — K219 Gastro-esophageal reflux disease without esophagitis: Secondary | ICD-10-CM

## 2023-10-22 MED ORDER — OMEPRAZOLE 40 MG PO CPDR: 40.0000 mg | DELAYED_RELEASE_CAPSULE | Freq: Two times a day (BID) | ORAL | 1 refills | Status: AC

## 2023-10-22 MED ORDER — INCLISIRAN SODIUM 284 MG/1.5ML ~~LOC~~ SOSY
284.0000 mg | PREFILLED_SYRINGE | Freq: Once | SUBCUTANEOUS | Status: AC
  Administered 2023-10-22: 284 mg via SUBCUTANEOUS

## 2023-10-22 NOTE — Progress Notes (Signed)
 Diagnosis: Hyperlipidemia  Provider:  Hall, Zack MD  Procedure: Injection  Leqvio  (inclisiran), Dose: 284 mg, Site: subcutaneous, Number of injections: 1  Injection Site(s): Left arm  Post Care: Observation period completed  Discharge: Condition: Good, Destination: Home . AVS Provided  Performed by:  Verneda Golder, RN

## 2023-10-22 NOTE — Telephone Encounter (Signed)
 Patient made aware script sent to requested pharmacy.

## 2023-10-22 NOTE — Telephone Encounter (Signed)
 Auth Submission: NO AUTH NEEDED Site of care: Site of care: [facility] Payer: Medicare a/b, BCBS supp Medication & CPT/J Code(s) submitted: Leqvio  (Inclisiran) J1306 Route of submission (phone, fax, portal): portal Phone # Fax # Auth type: Buy/Bill PB Units/visits requested: 284mg , 3 visits Reference number:  Approval from: 10/22/23 to 06/02/24

## 2023-10-22 NOTE — Telephone Encounter (Signed)
 90 day prescription sent to the patient requested pharmacy.

## 2023-10-22 NOTE — Telephone Encounter (Signed)
 Patient came to front desk early this morning asking if Dennis Miles would change his Omeprazole  prescription to a 90 day supply. [facility]. [phone]

## 2023-10-23 NOTE — Progress Notes (Signed)
 Office Visit Note  Patient: Dennis Miles             Date of Birth: [birth year]           MRN: [id number]             PCP: Omie Bickers, MD Referring: Omie Bickers, MD Visit Date: 11/06/2023 Occupation: @GUAROCC @  Subjective:  Right knee pain  History of Present Illness: Dennis Miles is a 73 y.o. male with gout and osteoarthritis.  He states he continues to have some discomfort in his right knee joint and some stiffness in his neck.  He has not had any gout flare in many years.  He continues to take allopurinol  450 mg daily.  He did not have to take any colchicine.    Activities of Daily Living:  Patient reports morning stiffness for 0 none.   Patient Reports nocturnal pain.  Difficulty dressing/grooming: Denies Difficulty climbing stairs: Denies Difficulty getting out of chair: Denies Difficulty using hands for taps, buttons, cutlery, and/or writing: Denies  Review of Systems  Constitutional:  Positive for fatigue.  HENT:  Positive for mouth dryness. Negative for mouth sores.   Eyes:  Positive for dryness.  Respiratory:  Negative for shortness of breath.   Cardiovascular:  Positive for chest pain. Negative for palpitations.  Gastrointestinal:  Negative for blood in stool, constipation and diarrhea.  Endocrine: Negative for increased urination.  Genitourinary:  Negative for involuntary urination.  Musculoskeletal:  Positive for joint swelling, myalgias, muscle weakness, muscle tenderness and myalgias. Negative for joint pain, gait problem, joint pain and morning stiffness.  Skin:  Negative for color change, rash, hair loss and sensitivity to sunlight.  Allergic/Immunologic: Negative for susceptible to infections.  Neurological:  Negative for dizziness and headaches.  Hematological:  Negative for swollen glands.  Psychiatric/Behavioral:  Positive for sleep disturbance. Negative for depressed mood. The patient is not nervous/anxious.     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   History of colonic polyps 08/15/2022   S/P arthroscopy of right knee 12/25/2021   Acute lateral meniscus tear of right knee 11/21/2021   Seasonal and perennial allergic rhinitis 10/26/2021   Eosinophilic esophagitis 07/17/2021   Esophageal dysphagia 05/14/2021   Abdominal pain, epigastric 05/14/2021   IBS (irritable bowel syndrome) 11/13/2020   Primary insomnia 09/26/2016   DJD (degenerative joint disease), cervical 09/26/2016   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   Abdominal pain, left lower quadrant 10/19/2012   Gastroesophageal reflux disease 04/15/2012   Gout 04/01/2011   Hypertension 04/01/2011   Elevated cholesterol with high triglycerides 04/01/2011    Past Medical History:  Diagnosis Date   Anxiety    GERD (gastroesophageal reflux disease)    Gout    Headache(784.0)    Hiatal hernia    High cholesterol    Hypertension    Skin cancer     Family History  Problem Relation Age of Onset   Aneurysm Father    Asthma Child    Colon cancer Neg Hx    Past Surgical History:  Procedure Laterality Date   APPENDECTOMY     BALLOON DILATION  09/12/2022   Procedure: BALLOON DILATION;  Surgeon: Urban Garden, MD;  [facility];  Service: Gastroenterology;;   BIOPSY  05/30/2021   Procedure: BIOPSY;  Surgeon: Ruby Corporal, MD;  [facility];  Service: Endoscopy;;   BIOPSY  09/12/2022   Procedure: BIOPSY;  Surgeon: Urban Garden, MD;  [facility];  Service: Gastroenterology;;   BIOPSY  04/14/2023   Procedure: BIOPSY;  Surgeon: Umberto Ganong, Bearl Limes, MD;  [facility];  Service: Gastroenterology;;   CATARACT EXTRACTION Right    retinal procedure done at same time per patient   COLONOSCOPY N/A 10/23/2012   Procedure: COLONOSCOPY;  Surgeon:  Ruby Corporal, MD;  [facility];  Service: Endoscopy;  Laterality: N/A;  235   COLONOSCOPY WITH PROPOFOL  N/A 09/12/2022   Procedure: COLONOSCOPY WITH PROPOFOL ;  Surgeon: Urban Garden, MD;  [facility];  Service: Gastroenterology;  Laterality: N/A;  12:30PM; ASA 1   ESOPHAGEAL DILATION N/A 05/30/2021   Procedure: ESOPHAGEAL DILATION;  Surgeon: Ruby Corporal, MD;  [facility];  Service: Endoscopy;  Laterality: N/A;   ESOPHAGOGASTRODUODENOSCOPY  04/17/2011   Procedure: ESOPHAGOGASTRODUODENOSCOPY (EGD);  Surgeon: Ruby Corporal, MD;  [facility];  Service: Endoscopy;  Laterality: N/A;  8:30   ESOPHAGOGASTRODUODENOSCOPY (EGD) WITH PROPOFOL  N/A 05/30/2021   Procedure: ESOPHAGOGASTRODUODENOSCOPY (EGD) WITH PROPOFOL ;  Surgeon: Ruby Corporal, MD;  [facility];  Service: Endoscopy;  Laterality: N/A;  7:30   ESOPHAGOGASTRODUODENOSCOPY (EGD) WITH PROPOFOL  N/A 09/12/2022   Procedure: ESOPHAGOGASTRODUODENOSCOPY (EGD) WITH PROPOFOL ;  Surgeon: Urban Garden, MD;  [facility];  Service: Gastroenterology;  Laterality: N/A;  12:30PM;ASA 1   ESOPHAGOGASTRODUODENOSCOPY (EGD) WITH PROPOFOL  N/A 04/14/2023   Procedure: ESOPHAGOGASTRODUODENOSCOPY (EGD) WITH PROPOFOL ;  Surgeon: Urban Garden, MD;  [facility];  Service: Gastroenterology;  Laterality: N/A;  8:45AM;ASA 1-2   EYE SURGERY     HAND SURGERY  05/28/2016   INGUINAL HERNIA REPAIR Left 02/17/2013   Procedure: HERNIA REPAIR INGUINAL ADULT;  Surgeon: Beau Bound, MD;  Location[facility];  Service: General;  Laterality: Left;   INSERTION OF MESH Left 02/17/2013   Procedure: INSERTION OF MESH;  Surgeon: Beau Bound, MD;  Location: [facility];  Service: General;  Laterality: Left;   KNEE ARTHROSCOPY W/ MENISCAL REPAIR Right 11/2021   POLYPECTOMY  09/12/2022   Procedure: POLYPECTOMY INTESTINAL;  Surgeon: Urban Garden, MD;   [facility];  Service: Gastroenterology;;   SINOSCOPY     sinus sugery     SKIN CANCER EXCISION     x4   SQUAMOUS CELL CARCINOMA EXCISION Left 2023   forearm   SQUAMOUS CELL CARCINOMA EXCISION  05/05/2023   on scalp, per patient   Social History   Social History Narrative   Lives at home with daughter.  Retired.  Education 12th grade.  Children 2.     Immunization History  Administered Date(s) Administered   PFIZER(Purple Top)SARS-COV-2 Vaccination 08/02/2019, 08/23/2019, 06/22/2020     Objective: Vital Signs: BP 125/75 (BP Location: Left Arm, Patient Position: Sitting, Cuff Size: Normal)   Pulse 65   Resp 15   Ht 5\' 9"  (1.753 m)   Wt 163 lb (73.9 kg)   BMI 24.07 kg/m    Physical Exam Vitals and nursing note reviewed.  Constitutional:      Appearance: He is well-developed.  HENT:     Head: Normocephalic and atraumatic.  Eyes:     Conjunctiva/sclera: Conjunctivae normal.     Pupils: Pupils are equal, round, and reactive to light.  Cardiovascular:     Rate and Rhythm: Normal rate and regular rhythm.  Heart sounds: Normal heart sounds.  Pulmonary:     Effort: Pulmonary effort is normal.     Breath sounds: Normal breath sounds.  Abdominal:     General: Bowel sounds are normal.     Palpations: Abdomen is soft.  Musculoskeletal:     Cervical back: Normal range of motion and neck supple.  Skin:    General: Skin is warm and dry.     Capillary Refill: Capillary refill takes less than 2 seconds.  Neurological:     Mental Status: He is alert and oriented to person, place, and time.  Psychiatric:        Behavior: Behavior normal.      Musculoskeletal Exam: Patient has some stiffness with range of motion of the cervical spine.  Shoulders, elbows, wrist joints with good range of motion.  PIP and DIP thickening with no synovitis was noted.  Hip joints and knee joints with good range of motion.  He has small effusion in his right knee joint.  Left knee joint  was in full range of motion.  There was no tenderness over ankles or MTPs.  CDAI Exam: CDAI Score: -- Patient Global: --; Provider Global: -- Swollen: --; Tender: -- Joint Exam 11/06/2023   No joint exam has been documented for this visit   There is currently no information documented on the homunculus. Go to the Rheumatology activity and complete the homunculus joint exam.  Investigation: No additional findings.  Imaging: No results found.  Recent Labs: Lab Results  Component Value Date   WBC 6.2 10/30/2021   HGB 13.8 10/30/2021   PLT 211 10/30/2021   NA 139 10/30/2021   K 4.4 10/30/2021   CL 100 10/30/2021   CO2 28 10/30/2021   GLUCOSE 121 (H) 10/30/2021   BUN 7 10/30/2021   CREATININE 1.00 10/30/2021   BILITOT 0.5 10/30/2021   ALKPHOS 71 09/26/2016   AST 24 10/30/2021   ALT 18 10/30/2021   PROT 6.6 10/30/2021   ALBUMIN 4.2 09/26/2016   CALCIUM  9.1 10/30/2021   GFRAA 75 07/11/2020   April 2025 CBC WBC 6.3, hemoglobin 13.8, platelets 204, CMP normal, AST 36, ALT 28, creatinine 1.0, GFR 81, hemoglobin A1c 5.5, PSA 1.06 March 2023 uric acid 3.0  Speciality Comments: No specialty comments available.  Procedures:  No procedures performed Allergies: Codeine, Crestor  [rosuvastatin ], Lipitor [atorvastatin], and Zetia [ezetimibe]   Assessment / Plan:     Visit Diagnoses: Idiopathic chronic gout of multiple sites with tophus -patient denies having a gout flare in many years.  He continues to take allopurinol  450 mg daily. uric acic: 3.0 on 04/01/2023.  Patient got labs in April but did not get uric acid level.  Advised him to get uric acid level with his next labs.  Dietary modifications were discussed.  Medication management-April 2025 CBC WBC 6.3, hemoglobin 13.8, platelets 204, CMP normal, AST 36, ALT 28, creatinine 1.0, GFR 81,  Effusion, right knee-he still had a small effusion in his right knee joint.  He had arthroscopic surgery in the past.  I advised him to  contact us  if he needs cortisone injection.  Chronic pain of right knee - Underwent right knee arthroscopic surgery in May 2023 performed by Dr. Christiane Cowing.  Primary osteoarthritis of both feet-he denies any stiffness in his feet today.  DDD (degenerative disc disease), cervical-he continues to have stiffness in the cervical spine.  A handout on neck exercises was given.  Primary insomnia -he is on Ambien 10 mg at  bedtime for insomnia.  History of hyperlipidemia-he is on Zetia.  History of hypertension-he is on lisinopril.  Blood pressure was 125/75.  History of gastroesophageal reflux (GERD)-he takes Prilosec.  Orders: No orders of the defined types were placed in this encounter.  No orders of the defined types were placed in this encounter.    Follow-Up Instructions: Return in about 6 months (around 05/07/2024) for Gout.   Nicholas Bari, MD  Note - This record has been created using Animal nutritionist.  Chart creation errors have been sought, but may not always  have been located. Such creation errors do not reflect on  the standard of medical care.

## 2023-11-06 ENCOUNTER — Encounter: Payer: Self-pay | Admitting: Rheumatology

## 2023-11-06 ENCOUNTER — Encounter: Payer: Medicare Other | Attending: Internal Medicine | Admitting: Rheumatology

## 2023-11-06 VITALS — BP 125/75 | HR 65 | Resp 15 | Ht 69.0 in | Wt 163.0 lb

## 2023-11-06 DIAGNOSIS — Z8639 Personal history of other endocrine, nutritional and metabolic disease: Secondary | ICD-10-CM

## 2023-11-06 DIAGNOSIS — F5101 Primary insomnia: Secondary | ICD-10-CM

## 2023-11-06 DIAGNOSIS — Z8719 Personal history of other diseases of the digestive system: Secondary | ICD-10-CM

## 2023-11-06 DIAGNOSIS — M5030 Other cervical disc degeneration, unspecified cervical region: Secondary | ICD-10-CM

## 2023-11-06 DIAGNOSIS — M25561 Pain in right knee: Secondary | ICD-10-CM | POA: Diagnosis not present

## 2023-11-06 DIAGNOSIS — Z79899 Other long term (current) drug therapy: Secondary | ICD-10-CM | POA: Diagnosis not present

## 2023-11-06 DIAGNOSIS — G8929 Other chronic pain: Secondary | ICD-10-CM | POA: Diagnosis not present

## 2023-11-06 DIAGNOSIS — M1A09X1 Idiopathic chronic gout, multiple sites, with tophus (tophi): Secondary | ICD-10-CM

## 2023-11-06 DIAGNOSIS — Z8679 Personal history of other diseases of the circulatory system: Secondary | ICD-10-CM | POA: Diagnosis present

## 2023-11-06 DIAGNOSIS — M19072 Primary osteoarthritis, left ankle and foot: Secondary | ICD-10-CM | POA: Diagnosis present

## 2023-11-06 DIAGNOSIS — M19071 Primary osteoarthritis, right ankle and foot: Secondary | ICD-10-CM | POA: Diagnosis not present

## 2023-11-06 DIAGNOSIS — M25461 Effusion, right knee: Secondary | ICD-10-CM

## 2023-11-06 NOTE — Patient Instructions (Signed)
 Please get CBC with differential, CMP with GFR and uric acid level with your next labs  Cervical Strain and Sprain Rehab Ask your health care provider which exercises are safe for you. Do exercises exactly as told by your health care provider and adjust them as directed. It is normal to feel mild stretching, pulling, tightness, or discomfort as you do these exercises. Stop right away if you feel sudden pain or your pain gets worse. Do not begin these exercises until told by your health care provider. Stretching and range-of-motion exercises Cervical side bending  Using good posture, sit on a stable chair or stand up. Without moving your shoulders, slowly tilt your left / right ear to your shoulder until you feel a stretch in the neck muscles on the opposite side. You should be looking straight ahead. Hold for __________ seconds. Repeat with the other side of your neck. Repeat __________ times. Complete this exercise __________ times a day. Cervical rotation  Using good posture, sit on a stable chair or stand up. Slowly turn your head to the side as if you are looking over your left / right shoulder. Keep your eyes level with the ground. Stop when you feel a stretch along the side and the back of your neck. Hold for __________ seconds. Repeat this by turning to your other side. Repeat __________ times. Complete this exercise __________ times a day. Thoracic extension and pectoral stretch  Roll a towel or a small blanket so it is about 4 inches (10 cm) in diameter. Lie down on your back on a firm surface. Put the towel in the middle of your back across your spine. It should not be under your shoulder blades. Put your hands behind your head and let your elbows fall out to your sides. Hold for __________ seconds. Repeat __________ times. Complete this exercise __________ times a day. Strengthening exercises Upper cervical flexion  Lie on your back with a thin pillow behind your head or a  small, rolled-up towel under your neck. Gently tuck your chin toward your chest and nod your head down to look toward your feet. Do not lift your head off the pillow. Hold for __________ seconds. Release the tension slowly. Relax your neck muscles completely before you repeat this exercise. Repeat __________ times. Complete this exercise __________ times a day. Cervical extension  Stand about 6 inches (15 cm) away from a wall, with your back facing the wall. Place a soft object, about 6-8 inches (15-20 cm) in diameter, between the back of your head and the wall. A soft object could be a small pillow, a ball, or a folded towel. Gently tilt your head back and press into the soft object. Keep your jaw and forehead relaxed. Hold for __________ seconds. Release the tension slowly. Relax your neck muscles completely before you repeat this exercise. Repeat __________ times. Complete this exercise __________ times a day. Posture and body mechanics Body mechanics refer to the movements and positions of your body while you do your daily activities. Posture is part of body mechanics. Good posture and healthy body mechanics can help to relieve stress in your body's tissues and joints. Good posture means that your spine is in its natural S-curve position (your spine is neutral), your shoulders are pulled back slightly, and your head is not tipped forward. The following are general guidelines for using improved posture and body mechanics in your everyday activities. Sitting  When sitting, keep your spine neutral and keep your feet flat on  the floor. Use a footrest, if needed, and keep your thighs parallel to the floor. Avoid rounding your shoulders. Avoid tilting your head forward. When working at a desk or a computer, keep your desk at a height where your hands are slightly lower than your elbows. Slide your chair under your desk so you are close enough to maintain good posture. When working at a computer,  place your monitor at a height where you are looking straight ahead and you do not have to tilt your head forward or downward to look at the screen. Standing  When standing, keep your spine neutral and keep your feet about hip-width apart. Keep a slight bend in your knees. Your ears, shoulders, and hips should line up. When you do a task in which you stand in one place for a long time, place one foot up on a stable object that is 2-4 inches (5-10 cm) high, such as a footstool. This helps keep your spine neutral. Resting When lying down and resting, avoid positions that are most painful for you. Try to support your neck in a neutral position. You can use a contour pillow or a small rolled-up towel. Your pillow should support your neck but not push on it. This information is not intended to replace advice given to you by your health care provider. Make sure you discuss any questions you have with your health care provider. Document Revised: 09/23/2022 Document Reviewed: 12/10/2021 Elsevier Patient Education  2024 [employer].

## 2023-11-19 ENCOUNTER — Ambulatory Visit (INDEPENDENT_AMBULATORY_CARE_PROVIDER_SITE_OTHER): Payer: Self-pay

## 2023-11-19 DIAGNOSIS — J309 Allergic rhinitis, unspecified: Secondary | ICD-10-CM

## 2023-11-25 DIAGNOSIS — J302 Other seasonal allergic rhinitis: Secondary | ICD-10-CM | POA: Diagnosis not present

## 2023-11-25 NOTE — Progress Notes (Signed)
 VIALS MADE 11-25-23

## 2023-11-26 DIAGNOSIS — J3081 Allergic rhinitis due to animal (cat) (dog) hair and dander: Secondary | ICD-10-CM | POA: Diagnosis not present

## 2023-12-23 NOTE — Progress Notes (Unsigned)
   [address] [place]  [place] [address] Dept: [phone]  FOLLOW UP NOTE  Patient ID: Dennis Miles, male    DOB: Jul 10, 1950  Age: 73 y.o. MRN: [id number] Date of Office Visit: 12/24/2023  Assessment  Chief Complaint: No chief complaint on file.  HPI Dennis Miles is a 73 year old male who presents to the clinic for follow-up visit.  He was last seen in this clinic on 07/02/2023 by Dr. Iva for evaluation of allergic rhinitis on allergen immunotherapy, deviated septum, chronic sinusitis, and EOE. He began allergen immunotherapy directed toward grass pollen, weed pollen, tree pollen, indoor mold, outdoor mold, dust mite, cat, dog, and cockroach on 03/09/2021 and reached maintenance in March 2023. He continues to follow-up with Dr. 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   He continues to follow-up with [facility] gastroenterology at [facility]  Discussed the use of AI scribe software for clinical note transcription with the patient, who gave verbal consent to proceed.  History of Present Illness     Chart review: Last EGD: 04/2023- Esophageal mucosal changes.                           - Benign-appearing esophageal stenosis. Dilated.                           - Normal stomach.                           - Normal examined duodenum.                           - Biopsies were taken with a cold forceps for                            evaluation of eosinophilic esophagitis.  A. ESOPHAGUS, DISTAL, BIOPSY: Squamous mucosa with reflux changes and increased intraepithelial eosinophils. Greater than 20 per high-power field.   B. ESOPHAGUS, MID, BIOPSY: Squamous mucosa with focally increased intraepithelial eosinophils. Focally up to 13 per high-power field. Drug Allergies:  Allergies  Allergen Reactions   Codeine Nausea Only   Crestor  [Rosuvastatin ] Other (See Comments)    myalgias   Lipitor  [Atorvastatin] Other (See Comments)    Myalgias    Zetia [Ezetimibe] Other (See Comments)    myalgias    Physical Exam: There were no vitals taken for this visit.   Physical Exam  Diagnostics:    Assessment and Plan: No diagnosis found.  No orders of the defined types were placed in this encounter.   There are no Patient Instructions on file for this visit.  No follow-ups on file.    Thank you for the opportunity to care for this patient.  Please do not hesitate to contact me with questions.  Arlean Mutter, FNP [facility]

## 2023-12-23 NOTE — Patient Instructions (Signed)
 Allergic rhinitis Continue allergen avoidance measures directed toward grass pollen, weed pollen, tree pollen, indoor mold, outdoor mold, dust mite, cat, dog and cockroach as listed below Continue allergen immunotherapy have access to the epinephrine  auto-injector set per protocol Continue an antihistamine once a day if needed for a runny nose or itch. Remember to rotate to a different antihistamine about every 3 months. Some examples of over the counter antihistamines include Zyrtec (cetirizine), Xyzal  (levocetirizine), Allegra (fexofenadine), and Claritin (loratidine).  Continue Flonase  2 sprays in each nostril once a day if needed for a stuffy nose Begin azelastine  2 sprays in each nostril up to twice a day of needed for a runny nose or itch. This can sometimes relieve sinus headache Consider saline nasal rinses as needed for nasal symptoms. Use this before any medicated nasal sprays for best result  Allergic conjunctivitis Some over the counter eye drops include Pataday one drop in each eye once a day as needed for red, itchy eyes OR Zaditor one drop in each eye twice a day as needed for red itchy eyes. Avoid eye drops that say red eye relief as they may contain medications that dry out your eyes.   EOE Continue omeprazole  as previously recommended Continue to follow-up with your GI specialist Consider Dupixent for control of EOE  Call the clinic if this treatment plan is not working well for you  Follow up in 1 year or sooner if needed.  Reducing Pollen Exposure The American Academy of Allergy, Asthma and Immunology suggests the following steps to reduce your exposure to pollen during allergy seasons. Do not hang sheets or clothing out to dry; pollen may collect on these items. Do not mow lawns or spend time around freshly cut grass; mowing stirs up pollen. Keep windows closed at night.  Keep car windows closed while driving. Minimize morning activities outdoors, a time when pollen  counts are usually at their highest. Stay indoors as much as possible when pollen counts or humidity is high and on windy days when pollen tends to remain in the air longer. Use air conditioning when possible.  Many air conditioners have filters that trap the pollen spores. Use a HEPA room air filter to remove pollen form the indoor air you breathe.  Control of Mold Allergen Mold and fungi can grow on a variety of surfaces provided certain temperature and moisture conditions exist.  Outdoor molds grow on plants, decaying vegetation and soil.  The major outdoor mold, Alternaria and Cladosporium, are found in very high numbers during hot and dry conditions.  Generally, a late Summer - Fall peak is seen for common outdoor fungal spores.  Rain will temporarily lower outdoor mold spore count, but counts rise rapidly when the rainy period ends.  The most important indoor molds are Aspergillus and Penicillium.  Dark, humid and poorly ventilated basements are ideal sites for mold growth.  The next most common sites of mold growth are the bathroom and the kitchen.  Outdoor [employer] Use air conditioning and keep windows closed Avoid exposure to decaying vegetation. Avoid leaf raking. Avoid grain handling. Consider wearing a face mask if working in moldy areas.  Indoor Mold Control Maintain humidity below 50%. Clean washable surfaces with 5% bleach solution. Remove sources e.g. Contaminated carpets.  Control of Dog or Cat Allergen Avoidance is the best way to manage a dog or cat allergy. If you have a dog or cat and are allergic to dog or cats, consider removing the dog or cat from  the home. If you have a dog or cat but don't want to find it a new home, or if your family wants a pet even though someone in the household is allergic, here are some strategies that may help keep symptoms at bay:  Keep the pet out of your bedroom and restrict it to only a few rooms. Be advised that keeping the dog or cat  in only one room will not limit the allergens to that room. Don't pet, hug or kiss the dog or cat; if you do, wash your hands with soap and water . High-efficiency particulate air (HEPA) cleaners run continuously in a bedroom or living room can reduce allergen levels over time. Regular use of a high-efficiency vacuum cleaner or a central vacuum can reduce allergen levels. Giving your dog or cat a bath at least once a week can reduce airborne allergen.  Control of Cockroach Allergen Cockroach allergen has been identified as an important cause of acute attacks of asthma, especially in urban settings.  There are fifty-five species of cockroach that exist in the [place] , however only three, the [place], [place] and [place] species produce allergen that can affect patients with Asthma.  Allergens can be obtained from fecal particles, egg casings and secretions from cockroaches.    Remove food sources. Reduce access to water . Seal access and entry points. Spray runways with 0.5-1% Diazinon or Chlorpyrifos Blow boric acid power under stoves and refrigerator. Place bait stations (hydramethylnon) at feeding sites.

## 2023-12-24 ENCOUNTER — Other Ambulatory Visit: Payer: Self-pay

## 2023-12-24 ENCOUNTER — Ambulatory Visit (INDEPENDENT_AMBULATORY_CARE_PROVIDER_SITE_OTHER): Admitting: Family Medicine

## 2023-12-24 ENCOUNTER — Encounter: Payer: Self-pay | Admitting: Family Medicine

## 2023-12-24 ENCOUNTER — Ambulatory Visit: Payer: Medicare Other | Admitting: Allergy & Immunology

## 2023-12-24 VITALS — BP 130/72 | HR 75 | Temp 98.0°F | Resp 18 | Ht 67.72 in | Wt 161.2 lb

## 2023-12-24 DIAGNOSIS — D225 Melanocytic nevi of trunk: Secondary | ICD-10-CM | POA: Diagnosis not present

## 2023-12-24 DIAGNOSIS — H1010 Acute atopic conjunctivitis, unspecified eye: Secondary | ICD-10-CM | POA: Insufficient documentation

## 2023-12-24 DIAGNOSIS — J3089 Other allergic rhinitis: Secondary | ICD-10-CM | POA: Diagnosis not present

## 2023-12-24 DIAGNOSIS — L814 Other melanin hyperpigmentation: Secondary | ICD-10-CM | POA: Diagnosis not present

## 2023-12-24 DIAGNOSIS — L578 Other skin changes due to chronic exposure to nonionizing radiation: Secondary | ICD-10-CM | POA: Diagnosis not present

## 2023-12-24 DIAGNOSIS — K200 Eosinophilic esophagitis: Secondary | ICD-10-CM | POA: Diagnosis not present

## 2023-12-24 DIAGNOSIS — L570 Actinic keratosis: Secondary | ICD-10-CM | POA: Diagnosis not present

## 2023-12-24 DIAGNOSIS — D2271 Melanocytic nevi of right lower limb, including hip: Secondary | ICD-10-CM | POA: Diagnosis not present

## 2023-12-24 DIAGNOSIS — H1013 Acute atopic conjunctivitis, bilateral: Secondary | ICD-10-CM | POA: Diagnosis not present

## 2023-12-24 DIAGNOSIS — D2272 Melanocytic nevi of left lower limb, including hip: Secondary | ICD-10-CM | POA: Diagnosis not present

## 2023-12-24 DIAGNOSIS — J302 Other seasonal allergic rhinitis: Secondary | ICD-10-CM

## 2023-12-24 DIAGNOSIS — Z85828 Personal history of other malignant neoplasm of skin: Secondary | ICD-10-CM | POA: Diagnosis not present

## 2023-12-24 DIAGNOSIS — L821 Other seborrheic keratosis: Secondary | ICD-10-CM | POA: Diagnosis not present

## 2023-12-24 MED ORDER — AZELASTINE HCL 0.1 % NA SOLN: 2.0000 | Freq: Two times a day (BID) | NASAL | 5 refills | Status: AC

## 2023-12-24 MED ORDER — LEVOCETIRIZINE DIHYDROCHLORIDE 5 MG PO TABS: 5.0000 mg | ORAL_TABLET | Freq: Every day | ORAL | 3 refills | Status: DC | PRN

## 2023-12-24 MED ORDER — FLUTICASONE PROPIONATE 50 MCG/ACT NA SUSP: 2.0000 | Freq: Two times a day (BID) | NASAL | 5 refills | Status: AC

## 2023-12-25 ENCOUNTER — Ambulatory Visit (INDEPENDENT_AMBULATORY_CARE_PROVIDER_SITE_OTHER): Admitting: Otolaryngology

## 2023-12-25 DIAGNOSIS — J3089 Other allergic rhinitis: Secondary | ICD-10-CM | POA: Diagnosis not present

## 2023-12-25 DIAGNOSIS — R0981 Nasal congestion: Secondary | ICD-10-CM

## 2023-12-25 DIAGNOSIS — J343 Hypertrophy of nasal turbinates: Secondary | ICD-10-CM

## 2023-12-25 DIAGNOSIS — J342 Deviated nasal septum: Secondary | ICD-10-CM

## 2023-12-25 MED ORDER — LEVOCETIRIZINE DIHYDROCHLORIDE 5 MG PO TABS: 5.0000 mg | ORAL_TABLET | Freq: Every day | ORAL | 3 refills | Status: AC | PRN

## 2023-12-25 NOTE — Progress Notes (Signed)
 ENT Progress Note:   Update 12/25/2023  History of Present Illness Discussed the use of AI scribe software for clinical note transcription with the patient, who gave verbal consent to proceed.  History of Present Illness [facility] Miles is a 73 year old male who presents for f/u of chronic nasal congestion and follow-up on exposed bone in the mouth.  The exposed bone in his mouth has healed and is no longer present. It resolved approximately one to two weeks ago without current issues.  He experiences intermittent nasal congestion, describing it as a lifelong issue with flare-ups or infections once or twice a year, managed with medical treatment. He has a large septal spur, but does not wish to have it surgically corrected as he is doing better from nasal congestion standpoint.  He is currently taking Xyzal  for allergies, Dupixent primarily for esophageal issues, and receives allergy shots.   Records Reviewed:  Initial Evaluation  Reason for Consult: chronic sinusitis  symptoms x 5-6 mo  HPI: Discussed the use of AI scribe software for clinical note transcription with the patient, who gave verbal consent to proceed.  History of Present Illness [facility] Miles is a 73 year old male with chronic sinus issues who presents with persistent nasal symptoms including chronic nasal congestion and difficulties breathing through his nose.  He has been experiencing persistent nasal symptoms since before Christmas, characterized by chronic nasal congestion, particularly on the right side, dark green mucus, and a fever reaching nearly 102F for two days. He describes significant facial pressure, likening it to his 'eyeballs were gonna pop out', and notes dental pain. Initial treatment with amoxicillin provided some relief, but symptoms recurred. Further treatment included another antibiotic and Xhance  nasal spray, which offered temporary relief. Currently, he experiences a runny nose, sneezing fits, and  mucus that is 'somewhat clear' but occasionally yellowish on the right side. He also reports bloody mucus and continues to use Flonase , an allergy tablet, and receives allergy shots.  He underwent sinus surgery in his twenties, described as the 'old style' with facial incisions. He recalls a significant sinus infection with severe symptoms around the end of December, treated with 2 courses of abx, but no recent surgical interventions have been performed.  He has a history of eosinophilic esophagitis, diagnosed approximately two years ago, initially presenting with choking episodes. He underwent endoscopy and esophageal dilation in the past. Current treatment with Dupixent has improved his condition, and a recent rescope showed a decrease in eosinophil counts in bx specimen.  He reports a sore on the inside of his gum/jaw on the left side, which he associates with eating a tough steak with some trauma at the site that never healed up. He has a history of chewing tobacco, which he no longer uses (recently quit).  No smoking history, but he has a history of chewing tobacco, which he no longer uses. He recalls a fall four to five years ago, landing on the side of his face, but no fractures were noted at that time.   Records Reviewed:  Allergy and Immunology 07/02/23  Seasonal and perennial allergic rhinitis (grasses, weeds, trees, indoor molds, outdoor molds, dust mites, cat, dog and cockroach) - on allergen immunotherapy with maintenance reached March 2023   Idiopathic gout - followed by Dr. Dolphus and controlled on allopurinol    Rightward septal deviation - has not had this surgically corrected   Chronic sinusitis - with transient improvement only with Augmentin and cefdinir  (ordering sinus CT)  EoE and GERD - with recent endoscopy showing 13 eosinophils per high power count Allergic rhinitis - with overlying chronic sinusitis - We are going to order a sinus CT since you have not responded  to the two rounds of antibiotics without improvement. - We will send you to see a different ENT.  - We will send in the Xhance  to see how much this would be for you. - Call us  if this is too expensive for you.  - In the meantime, start the prescription cough medicine so we can help you get some sleep.  - We can restart shots once you are doing better.  - You reached maintenance in March 2023, so we will plan to continue for at least 3 years (March 2026) or up to 5 years (March 2028).  - Continue allergen immunotherapy per protocol and have access to an epinephrine  autoinjector set. - Continue Xyz al 5 mg once a day as needed for runny nose or itch   2. Chest discomfort/ EOE/ Reflux - Continue omeprazole  as previously prescribed - Continue to follow-up with Dr. Selwyn.  - Consider Dupixent for better control of the EoE.   Past Medical History:  Diagnosis Date   Anxiety    GERD (gastroesophageal reflux disease)    Gout    Headache(784.0)    Hiatal hernia    High cholesterol    Hypertension    Skin cancer     Past Surgical History:  Procedure Laterality Date   APPENDECTOMY     BALLOON DILATION  09/12/2022   Procedure: BALLOON DILATION;  Surgeon: Eartha Angelia Sieving, MD;  [facility]: [facility];  Service: Gastroenterology;;   BIOPSY  05/30/2021   Procedure: BIOPSY;  Surgeon: Golda Claudis PENNER, MD;  [facility]: [facility];  Service: Endoscopy;;   BIOPSY  09/12/2022   Procedure: BIOPSY;  Surgeon: Eartha Angelia Sieving, MD;  [facility]: [facility];  Service: Gastroenterology;;   BIOPSY  04/14/2023   Procedure: BIOPSY;  Surgeon: Eartha Angelia, Sieving, MD;  Location: [facility];  Service: Gastroenterology;;   CATARACT EXTRACTION Right    retinal procedure done at same time per patient   COLONOSCOPY N/A 10/23/2012   Procedure: COLONOSCOPY;  Surgeon: Claudis PENNER Golda, MD;  [facility]: [facility];  Service: Endoscopy;  Laterality: N/A;  235   COLONOSCOPY WITH  PROPOFOL  N/A 09/12/2022   Procedure: COLONOSCOPY WITH PROPOFOL ;  Surgeon: Eartha Angelia Sieving, MD;  [facility]: [facility];  Service: Gastroenterology;  Laterality: N/A;  12:30PM; ASA 1   ESOPHAGEAL DILATION N/A 05/30/2021   Procedure: ESOPHAGEAL DILATION;  Surgeon: Golda Claudis PENNER, MD;  [facility]: [facility];  Service: Endoscopy;  Laterality: N/A;   ESOPHAGOGASTRODUODENOSCOPY  04/17/2011   Procedure: ESOPHAGOGASTRODUODENOSCOPY (EGD);  Surgeon: Claudis PENNER Golda, MD;  [facility]: [facility];  Service: Endoscopy;  Laterality: N/A;  8:30   ESOPHAGOGASTRODUODENOSCOPY (EGD) WITH PROPOFOL  N/A 05/30/2021   Procedure: ESOPHAGOGASTRODUODENOSCOPY (EGD) WITH PROPOFOL ;  Surgeon: Golda Claudis PENNER, MD;  [facility];  Service: Endoscopy;  Laterality: N/A;  7:30   ESOPHAGOGASTRODUODENOSCOPY (EGD) WITH PROPOFOL  N/A 09/12/2022   Procedure: ESOPHAGOGASTRODUODENOSCOPY (EGD) WITH PROPOFOL ;  Surgeon: Eartha Angelia Sieving, MD;  [facility];  Service: Gastroenterology;  Laterality: N/A;  12:30PM;ASA 1   ESOPHAGOGASTRODUODENOSCOPY (EGD) WITH PROPOFOL  N/A 04/14/2023   Procedure: ESOPHAGOGASTRODUODENOSCOPY (EGD) WITH PROPOFOL ;  Surgeon: Eartha Angelia Sieving, MD;  [facility];  Service: Gastroenterology;  Laterality: N/A;  8:45AM;ASA 1-2   EYE SURGERY  HAND SURGERY  05/28/2016   INGUINAL HERNIA REPAIR Left 02/17/2013   Procedure: HERNIA REPAIR INGUINAL ADULT;  Surgeon: Oneil DELENA Budge, MD;  Location: [facility];  Service: General;  Laterality: Left;   INSERTION OF MESH Left 02/17/2013   Procedure: INSERTION OF MESH;  Surgeon: Oneil DELENA Budge, MD;  Location: [facility];  Service: General;  Laterality: Left;   KNEE ARTHROSCOPY W/ MENISCAL REPAIR Right 11/2021   POLYPECTOMY  09/12/2022   Procedure: POLYPECTOMY INTESTINAL;  Surgeon: Eartha Angelia Sieving, MD;  [facility]: [facility];  Service: Gastroenterology;;   SINOSCOPY     sinus sugery     SKIN CANCER EXCISION      x4   SQUAMOUS CELL CARCINOMA EXCISION Left 2023   forearm   SQUAMOUS CELL CARCINOMA EXCISION  05/05/2023   on scalp, per patient    Family History  Problem Relation Age of Onset   Aneurysm Father    Asthma Child    Colon cancer Neg Hx     Social History:  reports that he has never smoked. He has never been exposed to tobacco smoke. He quit smokeless tobacco use about 11 years ago.  His smokeless tobacco use included chew. He reports current alcohol use of about 1.0 standard drink of alcohol per week. He reports that he does not use drugs.  Allergies:  Allergies  Allergen Reactions   Codeine Nausea Only   Crestor  [Rosuvastatin ] Other (See Comments)    myalgias   Lipitor [Atorvastatin] Other (See Comments)    Myalgias    Zetia [Ezetimibe] Other (See Comments)    myalgias    Medications: I have reviewed the patient's current medications.  The PMH, PSH, Medications, Allergies, and SH were reviewed and updated.  ROS: Constitutional: Negative for fever, weight loss and weight gain. Cardiovascular: Negative for chest pain and dyspnea on exertion. Respiratory: Is not experiencing shortness of breath at rest. Gastrointestinal: Negative for nausea and vomiting. Neurological: Negative for headaches. Psychiatric: The patient is not nervous/anxious  There were no vitals taken for this visit. There is no height or weight on file to calculate BMI.  PHYSICAL EXAM:  Exam: General: Well-developed, well-nourished Respiratory Respiratory effort: Equal inspiration and expiration without stridor Cardiovascular Peripheral Vascular: Warm extremities with equal color/perfusion Eyes: No nystagmus with equal extraocular motion bilaterally Neuro/Psych/Balance: Patient oriented to person, place, and time; Appropriate mood and affect; Gait is intact with no imbalance; Cranial nerves I-XII are intact Head and Face Inspection: Normocephalic and atraumatic without mass or lesion Palpation:  Facial skeleton intact without bony stepoffs Salivary Glands: No mass or tenderness Facial Strength: Facial motility symmetric and full bilaterally ENT Pinna: External ear intact and fully developed External canal: Canal is patent with intact skin Tympanic Membrane: Clear and mobile External Nose: No scar or anatomic deformity Bilateral inferior turbinate hypertrophy.  Lips, Teeth, and gums: Mucosa and teeth intact and viable TMJ: No pain to palpation with full mobility Oral cavity/oropharynx: No erythema or exudate, no lesions present Nasopharynx: No mass or lesion with intact mucosa Neck Neck and Trachea: Midline trachea without mass or lesion Thyroid: No mass or nodularity Lymphatics: No lymphadenopathy   Studies Reviewed: CT max face 07/21/2023 FINDINGS: Paranasal sinuses:   Frontal: Normally aerated. Patent frontal sinus drainage pathways.   Ethmoid: Normally aerated.   Maxillary: Prior right maxillary antrostomy/uncinectomy. As before, the right maxillary sinus is small as compared to the left. Redemonstrated small chronic bony defect within the anterior wall the right maxillary sinus (series 3, image  48). Mild mucosal thickening within the right maxillary sinus (with associated chronic reactive osteitis). The left maxillary sinus is normally aerated.   Sphenoid: Normally aerated. Patent sphenoethmoidal recesses.   Right ostiomeatal unit: The surgical ostium is narrowed by mucosal thickening (series 5, image 47).   Left ostiomeatal unit: Patent.   Nasal passages: Leftward deviation of the bony nasal septum posteriorly with leftward bony spurring. Bilateral concha bullosa. Minimal mucosal thickening within the bilateral nasal passages.   Anatomy: Pneumatization is present superior to the anterior ethmoid notch on the left. Symmetric and intact olfactory grooves and fovea ethmoidalis, Keros II (4-68mm). Sellar sphenoid pneumatization pattern.   Other: Periapical  lucency surrounding the left maxillary first molar tooth (consistent with periodontal disease) (series 6, image 51).   IMPRESSION: 1. Prior right maxillary antrostomy/uncinectomy. As before, the right maxillary sinus is small as compared to the left. Mild mucosal thickening within the right maxillary sinus (with associated chronic reactive osteitis). The right maxillary sinus surgical ostium is narrowed by mucosal thickening. 2. The paranasal sinuses are otherwise normally aerated, and the sinus drainage pathways are otherwise patent. 3. Leftward deviation of the bony nasal septum posteriorly (with leftward bony spurring). 4. Bilateral concha bullosa. 5. Minor mucosal thickening within the bilateral nasal passages. 6. Periapical lucency surrounding the left maxillary first molar tooth (consistent with periodontal disease).  Assessment/Plan: Encounter Diagnoses  Name Primary?   Environmental and seasonal allergies Yes   Hypertrophy of both inferior nasal turbinates    Nasal septal deviation    Chronic nasal congestion      Assessment and Plan Assessment & Plan Chronic Nasal Congestion and sx of sinus infection, does not appear to get completely resolved, s/p 2 courses of abx.   CT sinuses shows clear sinuses, minor mucosal thickening R maxillary, deviated septum, hypertrophy of left turbinate and periapical lucency left maxillary molar. Septoplasty/ITR discussed for nasal passage improvement, not for allergy symptoms. Nasal endoscopy w/o pus or polyps. - Perform nasal endoscopy to assess blockage and pus. - Continue allergy management with allergist. - Consider septoplasty/ITR for nasal obstruction if symptoms persist.  Environmental allergies  - continue Xhance   - continue Xyzal  and allergy shots   Eosinophilic Esophagitis (EoE) EoE managed with Dupixent. Symptoms improved, eosinophil count reduced. Choking episodes resolved. - continue to f/u with GI - continue  Dupixent  Periodontal Disease on CT sinuses  Radiological evidence of periodontal disease in left upper first molar. Possible low-grade infection suggested by lucency. - Refer to dentist for evaluation and management.  Oral Lesion Sore with what appears to be exposed bone along the lingual aspect of the left posterior mandible, unclear etiology but due to hx of chewing tobacco will monitor, and re-evaluate in 4 months, if persists will consider biopsy. Will have his dentist take a look at it as well. CT did not show any irregularities along the mandible in the area of concern and no lymphadenopathy.  - Refer to dentist for evaluation. - Schedule follow-up in four months. - Consider biopsy if lesion persists and diagnosis is uncertain.  Update 12/25/23 Chronic asal congestion Environmental allergies Septal deviation and ITH on scope done last time and on CT max/face Currently sx are well controlled  - Continue current medical management. - Refilled Xyzal  prescription. - Continue nasal sprays as recommended. - Follow up with allergy specialist and continue allergy shots  Oral mucosal tear/exposed bone - completely resolved   RTC PRN      Yaasir Menken, MD Otolaryngology [facility] Phone: [phone] Fax: [phone]    12/25/2023, 8:15 AM

## 2024-01-08 DIAGNOSIS — K2100 Gastro-esophageal reflux disease with esophagitis, without bleeding: Secondary | ICD-10-CM | POA: Diagnosis not present

## 2024-01-08 DIAGNOSIS — M1A00X1 Idiopathic chronic gout, unspecified site, with tophus (tophi): Secondary | ICD-10-CM | POA: Diagnosis not present

## 2024-01-08 DIAGNOSIS — G5600 Carpal tunnel syndrome, unspecified upper limb: Secondary | ICD-10-CM | POA: Diagnosis not present

## 2024-01-08 DIAGNOSIS — M1711 Unilateral primary osteoarthritis, right knee: Secondary | ICD-10-CM | POA: Diagnosis not present

## 2024-01-08 DIAGNOSIS — K056 Periodontal disease, unspecified: Secondary | ICD-10-CM | POA: Diagnosis not present

## 2024-01-08 DIAGNOSIS — K200 Eosinophilic esophagitis: Secondary | ICD-10-CM | POA: Diagnosis not present

## 2024-01-08 DIAGNOSIS — F411 Generalized anxiety disorder: Secondary | ICD-10-CM | POA: Diagnosis not present

## 2024-01-08 DIAGNOSIS — J309 Allergic rhinitis, unspecified: Secondary | ICD-10-CM | POA: Diagnosis not present

## 2024-01-08 DIAGNOSIS — I10 Essential (primary) hypertension: Secondary | ICD-10-CM | POA: Diagnosis not present

## 2024-01-08 DIAGNOSIS — F5105 Insomnia due to other mental disorder: Secondary | ICD-10-CM | POA: Diagnosis not present

## 2024-01-08 DIAGNOSIS — Z23 Encounter for immunization: Secondary | ICD-10-CM | POA: Diagnosis not present

## 2024-01-08 DIAGNOSIS — E875 Hyperkalemia: Secondary | ICD-10-CM | POA: Diagnosis not present

## 2024-01-20 ENCOUNTER — Telehealth (INDEPENDENT_AMBULATORY_CARE_PROVIDER_SITE_OTHER): Payer: Self-pay | Admitting: Gastroenterology

## 2024-01-20 ENCOUNTER — Encounter (INDEPENDENT_AMBULATORY_CARE_PROVIDER_SITE_OTHER): Payer: Self-pay | Admitting: Gastroenterology

## 2024-01-20 ENCOUNTER — Ambulatory Visit (INDEPENDENT_AMBULATORY_CARE_PROVIDER_SITE_OTHER): Admitting: Gastroenterology

## 2024-01-20 VITALS — BP 120/72 | HR 61 | Temp 98.1°F | Ht 67.0 in | Wt 160.8 lb

## 2024-01-20 DIAGNOSIS — R1030 Lower abdominal pain, unspecified: Secondary | ICD-10-CM | POA: Diagnosis not present

## 2024-01-20 DIAGNOSIS — R197 Diarrhea, unspecified: Secondary | ICD-10-CM | POA: Diagnosis not present

## 2024-01-20 DIAGNOSIS — K200 Eosinophilic esophagitis: Secondary | ICD-10-CM | POA: Diagnosis not present

## 2024-01-20 DIAGNOSIS — K219 Gastro-esophageal reflux disease without esophagitis: Secondary | ICD-10-CM

## 2024-01-20 DIAGNOSIS — R194 Change in bowel habit: Secondary | ICD-10-CM

## 2024-01-20 LAB — CBC
HCT: 39.4 % (ref 38.5–50.0)
Hemoglobin: 13.4 g/dL (ref 13.2–17.1)
MCH: 35.2 pg — ABNORMAL HIGH (ref 27.0–33.0)
MCHC: 34 g/dL (ref 32.0–36.0)
MCV: 103.4 fL — ABNORMAL HIGH (ref 80.0–100.0)
MPV: 12.3 fL (ref 7.5–12.5)
Platelets: 211 Thousand/uL (ref 140–400)
RBC: 3.81 Million/uL — ABNORMAL LOW (ref 4.20–5.80)
RDW: 12.1 % (ref 11.0–15.0)
WBC: 5.1 Thousand/uL (ref 3.8–10.8)

## 2024-01-20 LAB — COMPREHENSIVE METABOLIC PANEL WITH GFR
AG Ratio: 1.8 (calc) (ref 1.0–2.5)
ALT: 26 U/L (ref 9–46)
AST: 28 U/L (ref 10–35)
Albumin: 4.4 g/dL (ref 3.6–5.1)
Alkaline phosphatase (APISO): 67 U/L (ref 35–144)
BUN: 8 mg/dL (ref 7–25)
CO2: 30 mmol/L (ref 20–32)
Calcium: 9.6 mg/dL (ref 8.6–10.3)
Chloride: 101 mmol/L (ref 98–110)
Creat: 0.96 mg/dL (ref 0.70–1.28)
Globulin: 2.5 g/dL (ref 1.9–3.7)
Glucose, Bld: 95 mg/dL (ref 65–99)
Potassium: 4.8 mmol/L (ref 3.5–5.3)
Sodium: 138 mmol/L (ref 135–146)
Total Bilirubin: 0.6 mg/dL (ref 0.2–1.2)
Total Protein: 6.9 g/dL (ref 6.1–8.1)
eGFR: 85 mL/min/1.73m2 (ref >=60)

## 2024-01-20 LAB — TSH: TSH: 2.14 m[IU]/L (ref 0.40–4.50)

## 2024-01-20 NOTE — Telephone Encounter (Signed)
 Montie from [facility] left voicemail stating she is needing additional diagnosis for TSH that was ordered. Please advise. Thank you!!  [phone]

## 2024-01-20 NOTE — Progress Notes (Signed)
 Referring Provider: Shona Norleen PEDLAR, MD Primary Care Physician:  Shona Norleen PEDLAR, MD Primary GI Physician:   Chief Complaint  Patient presents with   Follow-up    Pt arrives for follow up. Pt currently have on and off diarrhea. 3-4 trips to bathroom when having diarrhea. Saturday morning had some diarrhea but has been ok since. Good, pretty much normal, bowel movement this morning. Diarrhea was yellow in color. The heavier meal pt eats the better stool he has. Has been going on about 6-8 weeks altogether. Esophageal issues seem to better, but does have occasional chest pan which is relived by burping.    HPI:   Dennis Miles is a 73 y.o. male with past medical history of GERD, EOE, anxiety, HTN, gout, skin cancer   Patient presenting today for:  Follow up of GERD/dysphagia/EOE Diarrhea  Last seen may, at that time having some issues with dysphagia with larger pills, some chest discofmort though improved from previously. No heartburn or acid regurgitation. Some belching. His otolaryngologist recommended considering dupixent for ongoing allergies and EOE   Recommended continue omeprazole  40mg  BID, good reflux precautions, make me aware if he wishes to pursue repeat EGD as recommended, consider dupixent if EOE still present when EGD done  Present: Having some intermittent diarrhea over the last 6-8 weeks. He endorses some occasional lower abdominal discomfort and occasional gas pains in his stomach. Has not taken anything for diarrhea. He notes that stools can be looser to watery, can have upwards of 3-4 stools per day when diarrhea occurs. He may start with looser stools during the day and may have a more solid stool at the end of the day. Notes that he has been eating more fresh veggies at home. He feels that greasy, processed foods make his symptoms worse. No new medications, antibiotics, no recent sick contracts or travel anywhere. No rectal bleeding or melena.   Taking omeprazole  40mg  daily  to BID, does not always take second dose, has occasional discomfort in his chest but usually can drink some coke and belch and has improvement with this. Denies dysphagia currently. No heartburn or acid coming up. Tries to sleep with HOB elevated and avoids eating late.    last EGD: 04/2023- Esophageal mucosal changes.                           - Benign-appearing esophageal stenosis. Dilated.                           - Normal stomach.                           - Normal examined duodenum.                           - Biopsies were taken with a cold forceps for                            evaluation of eosinophilic esophagitis.  A. ESOPHAGUS, DISTAL, BIOPSY: Squamous mucosa with reflux changes and increased intraepithelial eosinophils. Greater than 20 per high-power field.   B. ESOPHAGUS, MID, BIOPSY: Squamous mucosa with focally increased intraepithelial eosinophils. Focally up to 13 per high-power field.   Last Colonoscopy: 09/12/2022 5 mm polyp in the descending colon, internal hemorrhoids. Pathology consistent  with a hyperplastic polyp   Repeat colonoscopy in 10 years. Filed Weights   01/20/24 1017  Weight: 160 lb 12.8 oz (72.9 kg)     Past Medical History:  Diagnosis Date   Anxiety    GERD (gastroesophageal reflux disease)    Gout    Headache(784.0)    Hiatal hernia    High cholesterol    Hypertension    Skin cancer     Past Surgical History:  Procedure Laterality Date   APPENDECTOMY     BALLOON DILATION  09/12/2022   Procedure: BALLOON DILATION;  Surgeon: Eartha Angelia Sieving, MD;  [facility]: [facility];  Service: Gastroenterology;;   BIOPSY  05/30/2021   Procedure: BIOPSY;  Surgeon: Golda Claudis PENNER, MD;  [facility]: [facility];  Service: Endoscopy;;   BIOPSY  09/12/2022   Procedure: BIOPSY;  Surgeon: Eartha Angelia Sieving, MD;  [facility]: [facility];  Service: Gastroenterology;;   BIOPSY  04/14/2023   Procedure: BIOPSY;  Surgeon: Eartha Angelia,  Sieving, MD;  Location: [facility];  Service: Gastroenterology;;   CATARACT EXTRACTION Right    retinal procedure done at same time per patient   COLONOSCOPY N/A 10/23/2012   Procedure: COLONOSCOPY;  Surgeon: Claudis PENNER Golda, MD;  [facility]: [facility];  Service: Endoscopy;  Laterality: N/A;  235   COLONOSCOPY WITH PROPOFOL  N/A 09/12/2022   Procedure: COLONOSCOPY WITH PROPOFOL ;  Surgeon: Eartha Angelia Sieving, MD;  [facility]: [facility];  Service: Gastroenterology;  Laterality: N/A;  12:30PM; ASA 1   ESOPHAGEAL DILATION N/A 05/30/2021   Procedure: ESOPHAGEAL DILATION;  Surgeon: Golda Claudis PENNER, MD;  [facility]: [facility];  Service: Endoscopy;  Laterality: N/A;   ESOPHAGOGASTRODUODENOSCOPY  04/17/2011   Procedure: ESOPHAGOGASTRODUODENOSCOPY (EGD);  Surgeon: Claudis PENNER Golda, MD;  [facility];  Service: Endoscopy;  Laterality: N/A;  8:30   ESOPHAGOGASTRODUODENOSCOPY (EGD) WITH PROPOFOL  N/A 05/30/2021   Procedure: ESOPHAGOGASTRODUODENOSCOPY (EGD) WITH PROPOFOL ;  Surgeon: Golda Claudis PENNER, MD;  [facility];  Service: Endoscopy;  Laterality: N/A;  7:30   ESOPHAGOGASTRODUODENOSCOPY (EGD) WITH PROPOFOL  N/A 09/12/2022   Procedure: ESOPHAGOGASTRODUODENOSCOPY (EGD) WITH PROPOFOL ;  Surgeon: Eartha Angelia Sieving, MD;  [facility];  Service: Gastroenterology;  Laterality: N/A;  12:30PM;ASA 1   ESOPHAGOGASTRODUODENOSCOPY (EGD) WITH PROPOFOL  N/A 04/14/2023   Procedure: ESOPHAGOGASTRODUODENOSCOPY (EGD) WITH PROPOFOL ;  Surgeon: Eartha Angelia Sieving, MD;  [facility];  Service: Gastroenterology;  Laterality: N/A;  8:45AM;ASA 1-2   EYE SURGERY     HAND SURGERY  05/28/2016   INGUINAL HERNIA REPAIR Left 02/17/2013   Procedure: HERNIA REPAIR INGUINAL ADULT;  Surgeon: Oneil DELENA Budge, MD;  Location: [facility];  Service: General;  Laterality: Left;   INSERTION OF MESH Left 02/17/2013   Procedure: INSERTION OF MESH;  Surgeon: Oneil DELENA Budge, MD;   Location: [facility];  Service: General;  Laterality: Left;   KNEE ARTHROSCOPY W/ MENISCAL REPAIR Right 11/2021   POLYPECTOMY  09/12/2022   Procedure: POLYPECTOMY INTESTINAL;  Surgeon: Eartha Angelia Sieving, MD;  [facility];  Service: Gastroenterology;;   SINOSCOPY     sinus sugery     SKIN CANCER EXCISION     x4   SQUAMOUS CELL CARCINOMA EXCISION Left 2023   forearm   SQUAMOUS CELL CARCINOMA EXCISION  05/05/2023   on scalp, per patient    Current Outpatient Medications  Medication Sig Dispense Refill   allopurinol  (ZYLOPRIM ) 300 MG tablet Take 1.5 tablets (450 mg total) by mouth daily.  135 tablet 0   ALPRAZolam (XANAX) 0.25 MG tablet Take 1 tablet by mouth daily as needed.     azelastine  (ASTELIN ) 0.1 % nasal spray Place 2 sprays into both nostrils 2 (two) times daily. 30 mL 5   Coenzyme Q10 (CO Q 10) 100 MG CAPS Take 100 mg by mouth daily.     cyclobenzaprine (FLEXERIL) 5 MG tablet Take 5 mg by mouth daily as needed for muscle spasms (pinched nerve).     EPINEPHRINE  0.3 mg/0.3 mL IJ SOAJ injection Inject 0.3 mg into the muscle as needed for anaphylaxis. 2 each 1   fluorouracil (EFUDEX) 5 % cream 1 application to affected area Externally Twice a day; Duration: 3 weeks     fluticasone  (FLONASE ) 50 MCG/ACT nasal spray Place 2 sprays into both nostrils in the morning and at bedtime. 16 g 5   INCLISIRAN SODIUM  [place] Inject into the skin. Received at [facility]     levocetirizine (XYZAL ) 5 MG tablet Take 1 tablet (5 mg total) by mouth daily as needed for allergies (Can take an extra dose during flare ups.). 180 tablet 3   lisinopril (PRINIVIL,ZESTRIL) 10 MG tablet Take 10 mg by mouth daily.     omeprazole  (PRILOSEC) 40 MG capsule Take 1 capsule (40 mg total) by mouth 2 (two) times daily. 180 capsule 1   PRESCRIPTION MEDICATION Allergy injections at the allergist office     triamcinolone  cream (KENALOG ) 0.1 % APPLY CREAM EXTERNALLY TWICE DAILY FOR 14 DAYS     ZETIA 10 MG  tablet Take 1 tablet every day by oral route for 90 days.     zolpidem (AMBIEN) 10 MG tablet Take 10 mg by mouth at bedtime.     No current facility-administered medications for this visit.    Allergies as of 01/20/2024 - Review Complete 01/20/2024  Allergen Reaction Noted   Codeine Nausea Only 04/01/2011   Crestor  [rosuvastatin ] Other (See Comments) 07/26/2021   Lipitor [atorvastatin] Other (See Comments) 07/26/2021   Zetia [ezetimibe] Other (See Comments) 07/26/2021    Social History   Socioeconomic History   Marital status: Divorced    Spouse name: Not on file   Number of children: Not on file   Years of education: Not on file   Highest education level: Not on file  Occupational History   Not on file  Tobacco Use   Smoking status: Never    Passive exposure: Never   Smokeless tobacco: Former    Types: Chew    Quit date: 06/03/2012  Vaping Use   Vaping status: Never Used  Substance and Sexual Activity   Alcohol use: Yes    Alcohol/week: 1.0 standard drink of alcohol    Types: 1 Cans of beer per week    Comment: Social   Drug use: Never   Sexual activity: Not on file  Other Topics Concern   Not on file  Social History Narrative   Lives at home with daughter.  Retired.  Education 12th grade.  Children 2.     Social Drivers of Corporate investment banker Strain: Not on file  Food Insecurity: Not on file  Transportation Needs: Not on file  Physical Activity: Not on file  Stress: Not on file  Social Connections: Not on file    Review of systems General: negative for malaise, night sweats, fever, chills, weight loss Neck: Negative for lumps, goiter, pain and significant neck swelling Resp: Negative for cough, wheezing, dyspnea at rest CV: Negative for  chest pain, leg swelling, palpitations, orthopnea GI: denies melena, hematochezia, nausea, vomiting, constipation, dysphagia, odyonophagia, early satiety or unintentional weight loss. +diarrhea +lower abdominal  discomfort  MSK: Negative for joint pain or swelling, back pain, and muscle pain. Derm: Negative for itching or rash Psych: Denies depression, anxiety, memory loss, confusion. No homicidal or suicidal ideation.  Heme: Negative for prolonged bleeding, bruising easily, and swollen nodes. Endocrine: Negative for cold or heat intolerance, polyuria, polydipsia and goiter. Neuro: negative for tremor, gait imbalance, syncope and seizures. The remainder of the review of systems is noncontributory.  Physical Exam: BP 120/72   Pulse 61   Temp 98.1 F (36.7 C)   Ht 5' 7 (1.702 m)   Wt 160 lb 12.8 oz (72.9 kg)   BMI 25.18 kg/m  General:   Alert and oriented. No distress noted. Pleasant and cooperative.  Head:  Normocephalic and atraumatic. Eyes:  Conjuctiva clear without scleral icterus. Mouth:  Oral mucosa pink and moist. Good dentition. No lesions. Heart: Normal rate and rhythm, s1 and s2 heart sounds present.  Lungs: Clear lung sounds in all lobes. Respirations equal and unlabored. Abdomen:  +BS, soft, non-tender and non-distended. No rebound or guarding. No HSM or masses noted. Derm: No palmar erythema or jaundice Msk:  Symmetrical without gross deformities. Normal posture. Extremities:  Without edema. Neurologic:  Alert and  oriented x4 Psych:  Alert and cooperative. Normal mood and affect.  Invalid input(s): 6 MONTHS   ASSESSMENT: LAIKEN NOHR is a 73 y.o. male presenting today for follow up of GERD/dysphagia/EOE and with diarrhea  GERD/dysphagia/EOE: well controlled on omeprazole  40mg  BID with good reflux precautions. No real issues with dysphagia currently. As he is doing well, will continue with current regimen of PPI BID, no endoscopic evaluation warranted at this time. If he has recurrence of dysphagia, he will make me aware  Diarrhea: intermittent for the last 6-8 weeks, occasional lower abdominal discomfort. Denies recent sick contacts or travel, no new meds or  antibiotics. No rectal bleeding or melena. Last TCS in April 2024 with one HPP. Does report eating more fresh vegetables lately which could certainly be contributing. Would be important to rule out infectious and metabolic causes with stool studies, TSH, CMP and CBC. If studies are unremarkable and diarrhea persists, will check for other underlying causes such as BAD/EPI/celiacs disease.    PLAN:  -CMP, CBC, TSH -GI pathogen panel, C diff testing -consider fecal fat, pancreatic elastase, celiac panel if the above workup is unremarkable  -continue omeprazole  40mg  BID -good reflux precautions -pt to make me aware of recurrence of dysphagia   All questions were answered, patient verbalized understanding and is in agreement with plan as outlined above.   Follow Up: 2 months    Lanie Schelling L. Mariette, MSN, APRN, AGNP-C Adult-Gerontology Nurse Practitioner [facility]  I have reviewed the note and agree with the APP's assessment as described in this progress note  Toribio Fortune, MD Gastroenterology and Hepatology [facility]

## 2024-01-20 NOTE — Patient Instructions (Signed)
 Please continue omeprazole  40mg  twice daily and continuing to avoid trigger foods and eating late In regards to the diarrhea, we will check some basic labs and stool studies (please do not do stool studies unless you have diarrhea, they will not accept solid stools).  Follow up 2 months  It was a pleasure to see you today. I want to create trusting relationships with patients and provide genuine, compassionate, and quality care. I truly value your feedback! please be on the lookout for a survey regarding your visit with me today. I appreciate your input about our visit and your time in completing this!    Cierrah Dace L. Trayonna Bachmeier, MSN, APRN, AGNP-C Adult-Gerontology Nurse Practitioner [facility]

## 2024-01-20 NOTE — Telephone Encounter (Signed)
 Contacted Cynthia at [facility] and gave her code [id number]

## 2024-01-23 ENCOUNTER — Ambulatory Visit (INDEPENDENT_AMBULATORY_CARE_PROVIDER_SITE_OTHER)

## 2024-01-23 DIAGNOSIS — J309 Allergic rhinitis, unspecified: Secondary | ICD-10-CM

## 2024-01-28 ENCOUNTER — Ambulatory Visit (INDEPENDENT_AMBULATORY_CARE_PROVIDER_SITE_OTHER): Payer: Self-pay | Admitting: Gastroenterology

## 2024-02-16 ENCOUNTER — Ambulatory Visit (INDEPENDENT_AMBULATORY_CARE_PROVIDER_SITE_OTHER): Admitting: Gastroenterology

## 2024-02-25 ENCOUNTER — Ambulatory Visit (INDEPENDENT_AMBULATORY_CARE_PROVIDER_SITE_OTHER)

## 2024-02-25 DIAGNOSIS — J309 Allergic rhinitis, unspecified: Secondary | ICD-10-CM

## 2024-03-23 DIAGNOSIS — H35371 Puckering of macula, right eye: Secondary | ICD-10-CM | POA: Diagnosis not present

## 2024-03-25 ENCOUNTER — Ambulatory Visit (INDEPENDENT_AMBULATORY_CARE_PROVIDER_SITE_OTHER): Admitting: Gastroenterology

## 2024-03-25 ENCOUNTER — Encounter (INDEPENDENT_AMBULATORY_CARE_PROVIDER_SITE_OTHER): Payer: Self-pay | Admitting: Gastroenterology

## 2024-03-25 VITALS — BP 117/71 | HR 76 | Temp 97.6°F | Ht 69.0 in | Wt 167.9 lb

## 2024-03-25 DIAGNOSIS — K219 Gastro-esophageal reflux disease without esophagitis: Secondary | ICD-10-CM

## 2024-03-25 DIAGNOSIS — K200 Eosinophilic esophagitis: Secondary | ICD-10-CM

## 2024-03-25 NOTE — Progress Notes (Addendum)
 Referring Provider: Shona Norleen PEDLAR, MD Primary Care Physician:  Shona Norleen PEDLAR, MD Primary GI Physician: Dr. Eartha   Chief Complaint  Patient presents with   Follow-up    Pt arrives for follow up. Pt has had some indigestion issues. Diarrhea has resolved. Pt states other caregivers have mentioned him being on Dupixent for EOE   HPI:   Dennis Miles is a 73 y.o. male with past medical history of GERD, EOE, anxiety, HTN, gout, skin cancer   Patient presenting today for follow up of: GERD/dysphagia/EOE  Last seen August, at that time having some diarrhea x6-8weeks, some lower abd pain. Taking omeprazole  40mg  BID, sometimes misses second dose, occasional chest discomfort but improves with belching. No heartburn.   Recommended CMP, CBC, TSH, GI path/C diff, continue PPI BID, good reflux precautions, consider fecal fat, pancreatic elastase, celiac panel if diarrhea persisted and infectious studies negative  Stool studies not done as diarrhea resolved Labs on 8/19: TSh 2.14 CMP unremarkable   Present:  Feels GERD is well managed on omeprazole  40mg , sometimes only takes this once a day Occasionally he will feel his allopurinol  gets stuck on his R side of his throat, states he has to break this in half sometimes. He has no dysphagia with foods or liquids. No further chest pain. He occasionally has a gerd flare if he eats liver/onions, will take a tums when this occurs. Diarrhea resolved after last visit prior to stool studies being performed. Denies changes to appetite, weight loss, rectal bleeding, melena, abdominal pain. Patient did inquire about EOE stating some of his other providers have mentioned this to him.    last EGD: 04/2023- Esophageal mucosal changes.                           - Benign-appearing esophageal stenosis. Dilated.                           - Normal stomach.                           - Normal examined duodenum.                           - Biopsies were taken with a  cold forceps for                            evaluation of eosinophilic esophagitis.  A. ESOPHAGUS, DISTAL, BIOPSY: Squamous mucosa with reflux changes and increased intraepithelial eosinophils. Greater than 20 per high-power field.   B. ESOPHAGUS, MID, BIOPSY: Squamous mucosa with focally increased intraepithelial eosinophils. Focally up to 13 per high-power field.   Last Colonoscopy: 09/12/2022 5 mm polyp in the descending colon, internal hemorrhoids. Pathology consistent with a hyperplastic polyp   Repeat colonoscopy in 10 years.   Filed Weights   03/25/24 1147  Weight: 167 lb 14.4 oz (76.2 kg)     Past Medical History:  Diagnosis Date   Anxiety    GERD (gastroesophageal reflux disease)    Gout    Headache(784.0)    Hiatal hernia    High cholesterol    Hypertension    Skin cancer     Past Surgical History:  Procedure Laterality Date   APPENDECTOMY     BALLOON DILATION  09/12/2022  Procedure: BALLOON DILATION;  Surgeon: Eartha Flavors, Toribio, MD;  [facility];  Service: Gastroenterology;;   BIOPSY  05/30/2021   Procedure: BIOPSY;  Surgeon: Golda Claudis PENNER, MD;  [facility];  Service: Endoscopy;;   BIOPSY  09/12/2022   Procedure: BIOPSY;  Surgeon: Eartha Flavors Toribio, MD;  [facility];  Service: Gastroenterology;;   BIOPSY  04/14/2023   Procedure: BIOPSY;  Surgeon: Eartha Flavors, Toribio, MD;  [facility];  Service: Gastroenterology;;   CATARACT EXTRACTION Right    retinal procedure done at same time per patient   COLONOSCOPY N/A 10/23/2012   Procedure: COLONOSCOPY;  Surgeon: Claudis PENNER Golda, MD;  [facility];  Service: Endoscopy;  Laterality: N/A;  235   COLONOSCOPY WITH PROPOFOL  N/A 09/12/2022   Procedure: COLONOSCOPY WITH PROPOFOL ;  Surgeon: Eartha Flavors Toribio, MD;  [facility];  Service: Gastroenterology;  Laterality: N/A;  12:30PM; ASA 1   ESOPHAGEAL DILATION N/A  05/30/2021   Procedure: ESOPHAGEAL DILATION;  Surgeon: Golda Claudis PENNER, MD;  [facility];  Service: Endoscopy;  Laterality: N/A;   ESOPHAGOGASTRODUODENOSCOPY  04/17/2011   Procedure: ESOPHAGOGASTRODUODENOSCOPY (EGD);  Surgeon: Claudis PENNER Golda, MD;  [facility];  Service: Endoscopy;  Laterality: N/A;  8:30   ESOPHAGOGASTRODUODENOSCOPY (EGD) WITH PROPOFOL  N/A 05/30/2021   Procedure: ESOPHAGOGASTRODUODENOSCOPY (EGD) WITH PROPOFOL ;  Surgeon: Golda Claudis PENNER, MD;  [facility];  Service: Endoscopy;  Laterality: N/A;  7:30   ESOPHAGOGASTRODUODENOSCOPY (EGD) WITH PROPOFOL  N/A 09/12/2022   Procedure: ESOPHAGOGASTRODUODENOSCOPY (EGD) WITH PROPOFOL ;  Surgeon: Eartha Flavors Toribio, MD;  [facility];  Service: Gastroenterology;  Laterality: N/A;  12:30PM;ASA 1   ESOPHAGOGASTRODUODENOSCOPY (EGD) WITH PROPOFOL  N/A 04/14/2023   Procedure: ESOPHAGOGASTRODUODENOSCOPY (EGD) WITH PROPOFOL ;  Surgeon: Eartha Flavors Toribio, MD;  [facility];  Service: Gastroenterology;  Laterality: N/A;  8:45AM;ASA 1-2   EYE SURGERY     HAND SURGERY  05/28/2016   INGUINAL HERNIA REPAIR Left 02/17/2013   Procedure: HERNIA REPAIR INGUINAL ADULT;  Surgeon: Oneil DELENA Budge, MD;  Location: [facility];  Service: General;  Laterality: Left;   INSERTION OF MESH Left 02/17/2013   Procedure: INSERTION OF MESH;  Surgeon: Oneil DELENA Budge, MD;  Location: [facility];  Service: General;  Laterality: Left;   KNEE ARTHROSCOPY W/ MENISCAL REPAIR Right 11/2021   POLYPECTOMY  09/12/2022   Procedure: POLYPECTOMY INTESTINAL;  Surgeon: Eartha Flavors Toribio, MD;  [facility];  Service: Gastroenterology;;   SINOSCOPY     sinus sugery     SKIN CANCER EXCISION     x4   SQUAMOUS CELL CARCINOMA EXCISION Left 2023   forearm   SQUAMOUS CELL CARCINOMA EXCISION  05/05/2023   on scalp, per patient    Current Outpatient Medications  Medication Sig Dispense Refill   allopurinol   (ZYLOPRIM ) 300 MG tablet Take 1.5 tablets (450 mg total) by mouth daily. 135 tablet 0   ALPRAZolam (XANAX) 0.25 MG tablet Take 1 tablet by mouth daily as needed.     azelastine  (ASTELIN ) 0.1 % nasal spray Place 2 sprays into both nostrils 2 (two) times daily. 30 mL 5   Coenzyme Q10 (CO Q 10) 100 MG CAPS Take 100 mg by mouth daily.     cyclobenzaprine (FLEXERIL) 5 MG tablet Take 5 mg by mouth daily as needed for muscle spasms (pinched nerve).     EPINEPHRINE  0.3 mg/0.3 mL IJ SOAJ injection Inject 0.3 mg into the muscle  as needed for anaphylaxis. 2 each 1   fluorouracil (EFUDEX) 5 % cream 1 application to affected area Externally Twice a day; Duration: 3 weeks     fluticasone  (FLONASE ) 50 MCG/ACT nasal spray Place 2 sprays into both nostrils in the morning and at bedtime. 16 g 5   INCLISIRAN SODIUM  Alligator Inject into the skin. Received at [facility]     levocetirizine (XYZAL ) 5 MG tablet Take 1 tablet (5 mg total) by mouth daily as needed for allergies (Can take an extra dose during flare ups.). 180 tablet 3   lisinopril (PRINIVIL,ZESTRIL) 10 MG tablet Take 10 mg by mouth daily.     omeprazole  (PRILOSEC) 40 MG capsule Take 1 capsule (40 mg total) by mouth 2 (two) times daily. 180 capsule 1   PRESCRIPTION MEDICATION Allergy injections at the allergist office     triamcinolone  cream (KENALOG ) 0.1 % APPLY CREAM EXTERNALLY TWICE DAILY FOR 14 DAYS     ZETIA 10 MG tablet Take 1 tablet every day by oral route for 90 days.     zolpidem (AMBIEN) 10 MG tablet Take 10 mg by mouth at bedtime.     No current facility-administered medications for this visit.    Allergies as of 03/25/2024 - Review Complete 03/25/2024  Allergen Reaction Noted   Codeine Nausea Only 04/01/2011   Crestor  [rosuvastatin ] Other (See Comments) 07/26/2021   Lipitor [atorvastatin] Other (See Comments) 07/26/2021   Zetia [ezetimibe] Other (See Comments) 07/26/2021    Social History   Socioeconomic History   Marital status:  Divorced    Spouse name: Not on file   Number of children: Not on file   Years of education: Not on file   Highest education level: Not on file  Occupational History   Not on file  Tobacco Use   Smoking status: Never    Passive exposure: Never   Smokeless tobacco: Former    Types: Chew    Quit date: 06/03/2012  Vaping Use   Vaping status: Never Used  Substance and Sexual Activity   Alcohol use: Yes    Alcohol/week: 1.0 standard drink of alcohol    Types: 1 Cans of beer per week    Comment: Social   Drug use: Never   Sexual activity: Not on file  Other Topics Concern   Not on file  Social History Narrative   Lives at home with daughter.  Retired.  Education 12th grade.  Children 2.     Social Drivers of Corporate investment banker Strain: Not on file  Food Insecurity: Not on file  Transportation Needs: Not on file  Physical Activity: Not on file  Stress: Not on file  Social Connections: Not on file    Review of systems General: negative for malaise, night sweats, fever, chills, weight loss Neck: Negative for lumps, goiter, pain and significant neck swelling Resp: Negative for cough, wheezing, dyspnea at rest CV: Negative for chest pain, leg swelling, palpitations, orthopnea GI: denies melena, hematochezia, nausea, vomiting, diarrhea, constipation, dysphagia, odyonophagia, early satiety or unintentional weight loss.  MSK: Negative for joint pain or swelling, back pain, and muscle pain. Derm: Negative for itching or rash Psych: Denies depression, anxiety, memory loss, confusion. No homicidal or suicidal ideation.  Heme: Negative for prolonged bleeding, bruising easily, and swollen nodes. Endocrine: Negative for cold or heat intolerance, polyuria, polydipsia and goiter. Neuro: negative for tremor, gait imbalance, syncope and seizures. The remainder of the review of systems is noncontributory.  Physical Exam:  BP 117/71   Pulse 76   Temp 97.6 F (36.4 C)   Ht 5' 9  (1.753 m)   Wt 167 lb 14.4 oz (76.2 kg)   BMI 24.79 kg/m  General:   Alert and oriented. No distress noted. Pleasant and cooperative.  Head:  Normocephalic and atraumatic. Eyes:  Conjuctiva clear without scleral icterus. Mouth:  Oral mucosa pink and moist. Good dentition. No lesions. Heart: Normal rate and rhythm, s1 and s2 heart sounds present.  Lungs: Clear lung sounds in all lobes. Respirations equal and unlabored. Abdomen:  +BS, soft, non-tender and non-distended. No rebound or guarding. No HSM or masses noted. Derm: No palmar erythema or jaundice Msk:  Symmetrical without gross deformities. Normal posture. Extremities:  Without edema. Neurologic:  Alert and  oriented x4 Psych:  Alert and cooperative. Normal mood and affect.  Invalid input(s): 6 MONTHS   ASSESSMENT: Dennis Miles is a 73 y.o. male presenting today for follow up of GERD/dysphagia/EOE  GERD/dysphagia/EOE: well controlled on omeprazole  40mg  1-2 times per day with good reflux precautions. No real issues with dysphagia currently. As he is doing well, will continue with current regimen of PPI BID, no endoscopic evaluation warranted at this time. If he has recurrence of dysphagia, he will make me aware. He did inquire about dupixent as other providers have mentioned this to him but we discussed step therapy for EOE and at this time his EOE appears well controlled with PPI therapy, should he have further recurrence or symptoms refractory to PPI, would consider dupixent.    PLAN:  -continue omeprazole  40mg  1-2 times per day -good reflux precautions -pt to make me aware of GERD flare/dysphagia  All questions were answered, patient verbalized understanding and is in agreement with plan as outlined above.    Follow Up: 6 months   Shaynah Hund L. Mariette, MSN, APRN, AGNP-C Adult-Gerontology Nurse Practitioner [facility]  I have reviewed the note and agree with the APP's assessment as described in  this progress note  Toribio Fortune, MD Gastroenterology and Hepatology [facility] Gastroenterology

## 2024-03-25 NOTE — Patient Instructions (Signed)
 Please continue omeprazole  40mg  1-2 times per day Be mindful of greasy, spicy, fried, citrus foods, caffeine, carbonated drinks, chocolate and alcohol as these can increase reflux symptoms Stay upright 2-3 hours after eating, prior to lying down and avoid eating late in the evenings.  We will plan to see you back in about 6 months, if you have any GI issues in between now and then, please give me a call!  It was a pleasure to see you today. I want to create trusting relationships with patients and provide genuine, compassionate, and quality care. I truly value your feedback! please be on the lookout for a survey regarding your visit with me today. I appreciate your input about our visit and your time in completing this!    Treylon Henard L. Keeshia Sanderlin, MSN, APRN, AGNP-C Adult-Gerontology Nurse Practitioner [facility]

## 2024-03-30 DIAGNOSIS — E782 Mixed hyperlipidemia: Secondary | ICD-10-CM | POA: Diagnosis not present

## 2024-03-30 DIAGNOSIS — R7301 Impaired fasting glucose: Secondary | ICD-10-CM | POA: Diagnosis not present

## 2024-03-31 ENCOUNTER — Ambulatory Visit (INDEPENDENT_AMBULATORY_CARE_PROVIDER_SITE_OTHER): Payer: Self-pay

## 2024-03-31 DIAGNOSIS — J309 Allergic rhinitis, unspecified: Secondary | ICD-10-CM

## 2024-03-31 LAB — LAB REPORT - SCANNED
A1c: 5.4
Albumin, Urine POC: 4
Creatinine, POC: 125.6 mg/dL
EGFR: 69
Microalb Creat Ratio: 3

## 2024-04-06 DIAGNOSIS — L821 Other seborrheic keratosis: Secondary | ICD-10-CM | POA: Diagnosis not present

## 2024-04-06 DIAGNOSIS — D225 Melanocytic nevi of trunk: Secondary | ICD-10-CM | POA: Diagnosis not present

## 2024-04-06 DIAGNOSIS — D2272 Melanocytic nevi of left lower limb, including hip: Secondary | ICD-10-CM | POA: Diagnosis not present

## 2024-04-06 DIAGNOSIS — L814 Other melanin hyperpigmentation: Secondary | ICD-10-CM | POA: Diagnosis not present

## 2024-04-06 DIAGNOSIS — D2271 Melanocytic nevi of right lower limb, including hip: Secondary | ICD-10-CM | POA: Diagnosis not present

## 2024-04-06 DIAGNOSIS — Z85828 Personal history of other malignant neoplasm of skin: Secondary | ICD-10-CM | POA: Diagnosis not present

## 2024-04-06 DIAGNOSIS — D485 Neoplasm of uncertain behavior of skin: Secondary | ICD-10-CM | POA: Diagnosis not present

## 2024-04-06 DIAGNOSIS — L578 Other skin changes due to chronic exposure to nonionizing radiation: Secondary | ICD-10-CM | POA: Diagnosis not present

## 2024-04-06 DIAGNOSIS — L570 Actinic keratosis: Secondary | ICD-10-CM | POA: Diagnosis not present

## 2024-04-07 ENCOUNTER — Encounter (INDEPENDENT_AMBULATORY_CARE_PROVIDER_SITE_OTHER): Payer: Self-pay | Admitting: Gastroenterology

## 2024-04-08 ENCOUNTER — Ambulatory Visit: Payer: Self-pay | Admitting: Cardiology

## 2024-04-08 DIAGNOSIS — K200 Eosinophilic esophagitis: Secondary | ICD-10-CM | POA: Diagnosis not present

## 2024-04-08 DIAGNOSIS — I10 Essential (primary) hypertension: Secondary | ICD-10-CM | POA: Diagnosis not present

## 2024-04-08 DIAGNOSIS — G4700 Insomnia, unspecified: Secondary | ICD-10-CM | POA: Diagnosis not present

## 2024-04-08 DIAGNOSIS — M79652 Pain in left thigh: Secondary | ICD-10-CM | POA: Diagnosis not present

## 2024-04-08 DIAGNOSIS — K2100 Gastro-esophageal reflux disease with esophagitis, without bleeding: Secondary | ICD-10-CM | POA: Diagnosis not present

## 2024-04-08 DIAGNOSIS — M1A00X1 Idiopathic chronic gout, unspecified site, with tophus (tophi): Secondary | ICD-10-CM | POA: Diagnosis not present

## 2024-04-08 DIAGNOSIS — Z23 Encounter for immunization: Secondary | ICD-10-CM | POA: Diagnosis not present

## 2024-04-08 DIAGNOSIS — M79651 Pain in right thigh: Secondary | ICD-10-CM | POA: Diagnosis not present

## 2024-04-08 DIAGNOSIS — J309 Allergic rhinitis, unspecified: Secondary | ICD-10-CM | POA: Diagnosis not present

## 2024-04-08 DIAGNOSIS — F411 Generalized anxiety disorder: Secondary | ICD-10-CM | POA: Diagnosis not present

## 2024-04-08 DIAGNOSIS — Z0001 Encounter for general adult medical examination with abnormal findings: Secondary | ICD-10-CM | POA: Diagnosis not present

## 2024-04-08 DIAGNOSIS — Z0000 Encounter for general adult medical examination without abnormal findings: Secondary | ICD-10-CM | POA: Diagnosis not present

## 2024-04-23 ENCOUNTER — Ambulatory Visit (INDEPENDENT_AMBULATORY_CARE_PROVIDER_SITE_OTHER): Payer: Self-pay

## 2024-04-23 DIAGNOSIS — J309 Allergic rhinitis, unspecified: Secondary | ICD-10-CM | POA: Diagnosis not present

## 2024-04-23 NOTE — Progress Notes (Deleted)
 Office Visit Note  Patient: Dennis Miles             Date of Birth: 25-Oct-1950           MRN: [id number]             PCP: Shona Norleen PEDLAR, MD Referring: Shona Norleen PEDLAR, MD Visit Date: 05/07/2024 Occupation: Data Unavailable  Subjective:  No chief complaint on file.   History of Present Illness: Dennis Miles is a 73 y.o. male ***     Activities of Daily Living:  Patient reports morning stiffness for *** {minute/hour:19697}.   Patient {ACTIONS;DENIES/REPORTS:21021675::Denies} nocturnal pain.  Difficulty dressing/grooming: {ACTIONS;DENIES/REPORTS:21021675::Denies} Difficulty climbing stairs: {ACTIONS;DENIES/REPORTS:21021675::Denies} Difficulty getting out of chair: {ACTIONS;DENIES/REPORTS:21021675::Denies} Difficulty using hands for taps, buttons, cutlery, and/or writing: {ACTIONS;DENIES/REPORTS:21021675::Denies}  No Rheumatology ROS completed.   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   History of colonic polyps 08/15/2022   S/P arthroscopy of right knee 12/25/2021   Acute lateral meniscus tear of right knee 11/21/2021   Seasonal and perennial allergic rhinitis 10/26/2021   Eosinophilic esophagitis 07/17/2021   Esophageal dysphagia 05/14/2021   Abdominal pain, epigastric 05/14/2021   IBS (irritable bowel syndrome) 11/13/2020   Primary insomnia 09/26/2016   DJD (degenerative joint disease), cervical 09/26/2016   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   Lower abdominal pain 10/19/2012   Gastroesophageal reflux disease 04/15/2012   Gout 04/01/2011   Hypertension 04/01/2011   Elevated cholesterol with high triglycerides 04/01/2011    Past Medical History:  Diagnosis Date   Anxiety    GERD (gastroesophageal reflux disease)    Gout     Headache(784.0)    Hiatal hernia    High cholesterol    Hypertension    Skin cancer     Family History  Problem Relation Age of Onset   Aneurysm Father    Asthma Child    Colon cancer Neg Hx    Past Surgical History:  Procedure Laterality Date   APPENDECTOMY     BALLOON DILATION  09/12/2022   Procedure: BALLOON DILATION;  Surgeon: Eartha Angelia Sieving, MD;  [facility];  Service: Gastroenterology;;   BIOPSY  05/30/2021   Procedure: BIOPSY;  Surgeon: Golda Claudis PENNER, MD;  [facility];  Service: Endoscopy;;   BIOPSY  09/12/2022   Procedure: BIOPSY;  Surgeon: Eartha Angelia Sieving, MD;  [facility];  Service: Gastroenterology;;   BIOPSY  04/14/2023   Procedure: BIOPSY;  Surgeon: Eartha Angelia, Sieving, MD;  [facility];  Service: Gastroenterology;;   CATARACT EXTRACTION Right    retinal procedure done at same time per patient   COLONOSCOPY N/A 10/23/2012   Procedure: COLONOSCOPY;  Surgeon: Claudis PENNER Golda, MD;  [facility];  Service: Endoscopy;  Laterality: N/A;  235   COLONOSCOPY WITH PROPOFOL  N/A 09/12/2022   Procedure: COLONOSCOPY WITH PROPOFOL ;  Surgeon: Eartha Angelia Sieving, MD;  [facility];  Service: Gastroenterology;  Laterality: N/A;  12:30PM; ASA 1   ESOPHAGEAL DILATION N/A 05/30/2021   Procedure: ESOPHAGEAL DILATION;  Surgeon: Golda Claudis PENNER, MD;  [facility];  Service: Endoscopy;  Laterality: N/A;   ESOPHAGOGASTRODUODENOSCOPY  04/17/2011   Procedure: ESOPHAGOGASTRODUODENOSCOPY (EGD);  Surgeon: Claudis PENNER Golda, MD;  [facility];  Service: Endoscopy;  Laterality:  N/A;  8:30   ESOPHAGOGASTRODUODENOSCOPY (EGD) WITH PROPOFOL  N/A 05/30/2021   Procedure: ESOPHAGOGASTRODUODENOSCOPY (EGD) WITH PROPOFOL ;  Surgeon: Golda Claudis PENNER, MD;  [facility];  Service: Endoscopy;  Laterality: N/A;  7:30   ESOPHAGOGASTRODUODENOSCOPY (EGD) WITH PROPOFOL  N/A 09/12/2022    Procedure: ESOPHAGOGASTRODUODENOSCOPY (EGD) WITH PROPOFOL ;  Surgeon: Eartha Angelia Sieving, MD;  [facility];  Service: Gastroenterology;  Laterality: N/A;  12:30PM;ASA 1   ESOPHAGOGASTRODUODENOSCOPY (EGD) WITH PROPOFOL  N/A 04/14/2023   Procedure: ESOPHAGOGASTRODUODENOSCOPY (EGD) WITH PROPOFOL ;  Surgeon: Eartha Angelia Sieving, MD;  [facility];  Service: Gastroenterology;  Laterality: N/A;  8:45AM;ASA 1-2   EYE SURGERY     HAND SURGERY  05/28/2016   INGUINAL HERNIA REPAIR Left 02/17/2013   Procedure: HERNIA REPAIR INGUINAL ADULT;  Surgeon: Oneil DELENA Budge, MD;  Location[facility];  Service: General;  Laterality: Left;   INSERTION OF MESH Left 02/17/2013   Procedure: INSERTION OF MESH;  Surgeon: Oneil DELENA Budge, MD;  Location: [facility];  Service: General;  Laterality: Left;   KNEE ARTHROSCOPY W/ MENISCAL REPAIR Right 11/2021   POLYPECTOMY  09/12/2022   Procedure: POLYPECTOMY INTESTINAL;  Surgeon: Eartha Angelia Sieving, MD;  [facility];  Service: Gastroenterology;;   SINOSCOPY     sinus sugery     SKIN CANCER EXCISION     x4   SQUAMOUS CELL CARCINOMA EXCISION Left 2023   forearm   SQUAMOUS CELL CARCINOMA EXCISION  05/05/2023   on scalp, per patient   Social History   Tobacco Use   Smoking status: Never    Passive exposure: Never   Smokeless tobacco: Former    Types: Chew    Quit date: 06/03/2012  Vaping Use   Vaping status: Never Used  Substance Use Topics   Alcohol use: Yes    Alcohol/week: 1.0 standard drink of alcohol    Types: 1 Cans of beer per week    Comment: Social   Drug use: Never   Social History   Social History Narrative   Lives at home with daughter.  Retired.  Education 12th grade.  Children 2.       Immunization History  Administered Date(s) Administered   PFIZER(Purple Top)SARS-COV-2 Vaccination 08/02/2019, 08/23/2019, 06/22/2020     Objective: Vital Signs: There were no vitals taken for this visit.    Physical Exam   Musculoskeletal Exam: ***  CDAI Exam: CDAI Score: -- Patient Global: --; Provider Global: -- Swollen: --; Tender: -- Joint Exam 05/07/2024   No joint exam has been documented for this visit   There is currently no information documented on the homunculus. Go to the Rheumatology activity and complete the homunculus joint exam.  Investigation: No additional findings.  Imaging: No results found.  Recent Labs: Lab Results  Component Value Date   WBC 5.1 01/20/2024   HGB 13.4 01/20/2024   PLT 211 01/20/2024   NA 138 01/20/2024   K 4.8 01/20/2024   CL 101 01/20/2024   CO2 30 01/20/2024   GLUCOSE 95 01/20/2024   BUN 8 01/20/2024   CREATININE 0.96 01/20/2024   BILITOT 0.6 01/20/2024   ALKPHOS 71 09/26/2016   AST 28 01/20/2024   ALT 26 01/20/2024   PROT 6.9 01/20/2024   ALBUMIN 4.2 09/26/2016   CALCIUM  9.6 01/20/2024   GFRAA 75 07/11/2020    Speciality Comments: No specialty comments available.  Procedures:  No procedures performed Allergies: Codeine, Crestor  [rosuvastatin ], Lipitor [atorvastatin], and Zetia [ezetimibe]   Assessment / Plan:  Visit Diagnoses: Idiopathic chronic gout of multiple sites with tophus  Medication management  Effusion, right knee  Chronic pain of right knee  Primary osteoarthritis of both feet  DDD (degenerative disc disease), cervical  Primary insomnia  History of hyperlipidemia  History of hypertension  History of gastroesophageal reflux (GERD)  Orders: No orders of the defined types were placed in this encounter.  No orders of the defined types were placed in this encounter.   Face-to-face time spent with patient was *** minutes. Greater than 50% of time was spent in counseling and coordination of care.  Follow-Up Instructions: No follow-ups on file.   Waddell CHRISTELLA Craze, PA-C  Note - This record has been created using Dragon software.  Chart creation errors have been sought, but may not always   have been located. Such creation errors do not reflect on  the standard of medical care.

## 2024-04-26 ENCOUNTER — Encounter: Attending: Internal Medicine | Admitting: *Deleted

## 2024-04-26 VITALS — BP 155/90 | HR 76 | Temp 97.6°F

## 2024-04-26 DIAGNOSIS — E782 Mixed hyperlipidemia: Secondary | ICD-10-CM | POA: Insufficient documentation

## 2024-04-26 DIAGNOSIS — I2510 Atherosclerotic heart disease of native coronary artery without angina pectoris: Secondary | ICD-10-CM | POA: Diagnosis present

## 2024-04-26 MED ORDER — INCLISIRAN SODIUM 284 MG/1.5ML ~~LOC~~ SOSY
284.0000 mg | PREFILLED_SYRINGE | Freq: Once | SUBCUTANEOUS | Status: AC
  Administered 2024-04-26: 284 mg via SUBCUTANEOUS

## 2024-04-26 NOTE — Progress Notes (Signed)
 Diagnosis: Hyperlipidemia  Provider:  Hall, Zack MD  Procedure: Injection  Leqvio  (inclisiran), Dose: 284 mg, Site: subcutaneous, Number of injections: 1  Injection Site(s): Left arm  Post Care: Observation period completed  Discharge: Condition: Good, Destination: Home . AVS Provided  Performed by:  Verneda Golder, RN

## 2024-04-28 ENCOUNTER — Ambulatory Visit (INDEPENDENT_AMBULATORY_CARE_PROVIDER_SITE_OTHER)

## 2024-04-28 DIAGNOSIS — J3089 Other allergic rhinitis: Secondary | ICD-10-CM | POA: Diagnosis not present

## 2024-04-28 DIAGNOSIS — J309 Allergic rhinitis, unspecified: Secondary | ICD-10-CM

## 2024-05-05 ENCOUNTER — Ambulatory Visit

## 2024-05-05 DIAGNOSIS — J3089 Other allergic rhinitis: Secondary | ICD-10-CM | POA: Diagnosis not present

## 2024-05-05 DIAGNOSIS — J309 Allergic rhinitis, unspecified: Secondary | ICD-10-CM

## 2024-05-07 ENCOUNTER — Ambulatory Visit: Admitting: Physician Assistant

## 2024-05-07 DIAGNOSIS — M5030 Other cervical disc degeneration, unspecified cervical region: Secondary | ICD-10-CM

## 2024-05-07 DIAGNOSIS — Z8679 Personal history of other diseases of the circulatory system: Secondary | ICD-10-CM

## 2024-05-07 DIAGNOSIS — G8929 Other chronic pain: Secondary | ICD-10-CM

## 2024-05-07 DIAGNOSIS — Z8719 Personal history of other diseases of the digestive system: Secondary | ICD-10-CM

## 2024-05-07 DIAGNOSIS — M25461 Effusion, right knee: Secondary | ICD-10-CM

## 2024-05-07 DIAGNOSIS — M19071 Primary osteoarthritis, right ankle and foot: Secondary | ICD-10-CM

## 2024-05-07 DIAGNOSIS — M1A09X1 Idiopathic chronic gout, multiple sites, with tophus (tophi): Secondary | ICD-10-CM

## 2024-05-07 DIAGNOSIS — Z79899 Other long term (current) drug therapy: Secondary | ICD-10-CM

## 2024-05-07 DIAGNOSIS — Z8639 Personal history of other endocrine, nutritional and metabolic disease: Secondary | ICD-10-CM

## 2024-05-07 DIAGNOSIS — F5101 Primary insomnia: Secondary | ICD-10-CM

## 2024-05-10 NOTE — Progress Notes (Deleted)
 Office Visit Note  Patient: Dennis Miles             Date of Birth: 19-Feb-1951           MRN: [id number]             PCP: Shona Norleen PEDLAR, MD Referring: Shona Norleen PEDLAR, MD Visit Date: 05/20/2024 Occupation: Data Unavailable  Subjective:  No chief complaint on file.   History of Present Illness: Dennis Miles is a 73 y.o. male ***     Activities of Daily Living:  Patient reports morning stiffness for *** {minute/hour:19697}.   Patient {ACTIONS;DENIES/REPORTS:21021675::Denies} nocturnal pain.  Difficulty dressing/grooming: {ACTIONS;DENIES/REPORTS:21021675::Denies} Difficulty climbing stairs: {ACTIONS;DENIES/REPORTS:21021675::Denies} Difficulty getting out of chair: {ACTIONS;DENIES/REPORTS:21021675::Denies} Difficulty using hands for taps, buttons, cutlery, and/or writing: {ACTIONS;DENIES/REPORTS:21021675::Denies}  No Rheumatology ROS completed.   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   History of colonic polyps 08/15/2022   S/P arthroscopy of right knee 12/25/2021   Acute lateral meniscus tear of right knee 11/21/2021   Seasonal and perennial allergic rhinitis 10/26/2021   Eosinophilic esophagitis 07/17/2021   Esophageal dysphagia 05/14/2021   Abdominal pain, epigastric 05/14/2021   IBS (irritable bowel syndrome) 11/13/2020   Primary insomnia 09/26/2016   DJD (degenerative joint disease), cervical 09/26/2016   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   Lower abdominal pain 10/19/2012   Gastroesophageal reflux disease 04/15/2012   Gout 04/01/2011   Hypertension 04/01/2011   Elevated cholesterol with high triglycerides 04/01/2011    Past Medical History:  Diagnosis Date   Anxiety    GERD (gastroesophageal reflux disease)    Gout     Headache(784.0)    Hiatal hernia    High cholesterol    Hypertension    Skin cancer     Family History  Problem Relation Age of Onset   Aneurysm Father    Asthma Child    Colon cancer Neg Hx    Past Surgical History:  Procedure Laterality Date   APPENDECTOMY     BALLOON DILATION  09/12/2022   Procedure: BALLOON DILATION;  Surgeon: Eartha Angelia Sieving, MD;  Location: [facility];  Service: Gastroenterology;;   BIOPSY  05/30/2021   Procedure: BIOPSY;  Surgeon: Golda Claudis PENNER, MD;  Location: [facility];  Service: Endoscopy;;   BIOPSY  09/12/2022   Procedure: BIOPSY;  Surgeon: Eartha Angelia Sieving, MD;  Location: [facility];  Service: Gastroenterology;;   BIOPSY  04/14/2023   Procedure: BIOPSY;  Surgeon: Eartha Angelia, Sieving, MD;  Location: [facility];  Service: Gastroenterology;;   CATARACT EXTRACTION Right    retinal procedure done at same time per patient   COLONOSCOPY N/A 10/23/2012   Procedure: COLONOSCOPY;  Surgeon: Claudis PENNER Golda, MD;  Location: [facility];  Service: Endoscopy;  Laterality: N/A;  235   COLONOSCOPY WITH PROPOFOL  N/A 09/12/2022   Procedure: COLONOSCOPY WITH PROPOFOL ;  Surgeon: Eartha Angelia Sieving, MD;  Location: [facility];  Service: Gastroenterology;  Laterality: N/A;  12:30PM; ASA 1   ESOPHAGEAL DILATION N/A 05/30/2021   Procedure: ESOPHAGEAL DILATION;  Surgeon: Golda Claudis PENNER, MD;  Location: [facility];  Service: Endoscopy;  Laterality: N/A;   ESOPHAGOGASTRODUODENOSCOPY  04/17/2011   Procedure: ESOPHAGOGASTRODUODENOSCOPY (EGD);  Surgeon: Claudis PENNER Golda, MD;  Location: [facility];  Service: Endoscopy;  Laterality:  N/A;  8:30   ESOPHAGOGASTRODUODENOSCOPY (EGD) WITH PROPOFOL  N/A 05/30/2021   Procedure: ESOPHAGOGASTRODUODENOSCOPY (EGD) WITH PROPOFOL ;  Surgeon: Golda Claudis PENNER, MD;  Location: [facility];  Service: Endoscopy;  Laterality: N/A;  7:30   ESOPHAGOGASTRODUODENOSCOPY (EGD) WITH PROPOFOL  N/A 09/12/2022    Procedure: ESOPHAGOGASTRODUODENOSCOPY (EGD) WITH PROPOFOL ;  Surgeon: Eartha Angelia Sieving, MD;  Location: [facility];  Service: Gastroenterology;  Laterality: N/A;  12:30PM;ASA 1   ESOPHAGOGASTRODUODENOSCOPY (EGD) WITH PROPOFOL  N/A 04/14/2023   Procedure: ESOPHAGOGASTRODUODENOSCOPY (EGD) WITH PROPOFOL ;  Surgeon: Eartha Angelia Sieving, MD;  Location: [facility];  Service: Gastroenterology;  Laterality: N/A;  8:45AM;ASA 1-2   EYE SURGERY     HAND SURGERY  05/28/2016   INGUINAL HERNIA REPAIR Left 02/17/2013   Procedure: HERNIA REPAIR INGUINAL ADULT;  Surgeon: Oneil DELENA Budge, MD;  Location: [facility];  Service: General;  Laterality: Left;   INSERTION OF MESH Left 02/17/2013   Procedure: INSERTION OF MESH;  Surgeon: Oneil DELENA Budge, MD;  Location: [facility];  Service: General;  Laterality: Left;   KNEE ARTHROSCOPY W/ MENISCAL REPAIR Right 11/2021   POLYPECTOMY  09/12/2022   Procedure: POLYPECTOMY INTESTINAL;  Surgeon: Eartha Angelia Sieving, MD;  Location: [facility];  Service: Gastroenterology;;   SINOSCOPY     sinus sugery     SKIN CANCER EXCISION     x4   SQUAMOUS CELL CARCINOMA EXCISION Left 2023   forearm   SQUAMOUS CELL CARCINOMA EXCISION  05/05/2023   on scalp, per patient   Social History   Tobacco Use   Smoking status: Never    Passive exposure: Never   Smokeless tobacco: Former    Types: Chew    Quit date: 06/03/2012  Vaping Use   Vaping status: Never Used  Substance Use Topics   Alcohol use: Yes    Alcohol/week: 1.0 standard drink of alcohol    Types: 1 Cans of beer per week    Comment: Social   Drug use: Never   Social History   Social History Narrative   Lives at home with daughter.  Retired.  Education 12th grade.  Children 2.       Immunization History  Administered Date(s) Administered   PFIZER(Purple Top)SARS-COV-2 Vaccination 08/02/2019, 08/23/2019, 06/22/2020     Objective: Vital Signs: There were no vitals taken for this visit.    Physical Exam   Musculoskeletal Exam: ***  CDAI Exam: CDAI Score: -- Patient Global: --; Provider Global: -- Swollen: --; Tender: -- Joint Exam 05/20/2024   No joint exam has been documented for this visit   There is currently no information documented on the homunculus. Go to the Rheumatology activity and complete the homunculus joint exam.  Investigation: No additional findings.  Imaging: No results found.  Recent Labs: Lab Results  Component Value Date   WBC 5.1 01/20/2024   HGB 13.4 01/20/2024   PLT 211 01/20/2024   NA 138 01/20/2024   K 4.8 01/20/2024   CL 101 01/20/2024   CO2 30 01/20/2024   GLUCOSE 95 01/20/2024   BUN 8 01/20/2024   CREATININE 0.96 01/20/2024   BILITOT 0.6 01/20/2024   ALKPHOS 71 09/26/2016   AST 28 01/20/2024   ALT 26 01/20/2024   PROT 6.9 01/20/2024   ALBUMIN 4.2 09/26/2016   CALCIUM  9.6 01/20/2024   GFRAA 75 07/11/2020    Speciality Comments: No specialty comments available.  Procedures:  No procedures performed Allergies: Codeine, Crestor  [rosuvastatin ], Lipitor [atorvastatin], and Zetia [ezetimibe]   Assessment / Plan:  Visit Diagnoses: No diagnosis found.  Orders: No orders of the defined types were placed in this encounter.  No orders of the defined types were placed in this encounter.   Face-to-face time spent with patient was *** minutes. Greater than 50% of time was spent in counseling and coordination of care.  Follow-Up Instructions: No follow-ups on file.   Daved JAYSON Gavel, CMA  Note - This record has been created using Animal nutritionist.  Chart creation errors have been sought, but may not always  have been located. Such creation errors do not reflect on  the standard of medical care.

## 2024-05-20 ENCOUNTER — Encounter: Admitting: Rheumatology

## 2024-05-20 DIAGNOSIS — M25461 Effusion, right knee: Secondary | ICD-10-CM

## 2024-05-20 DIAGNOSIS — M5030 Other cervical disc degeneration, unspecified cervical region: Secondary | ICD-10-CM

## 2024-05-20 DIAGNOSIS — M19071 Primary osteoarthritis, right ankle and foot: Secondary | ICD-10-CM

## 2024-05-20 DIAGNOSIS — M79651 Pain in right thigh: Secondary | ICD-10-CM | POA: Diagnosis not present

## 2024-05-20 DIAGNOSIS — C4492 Squamous cell carcinoma of skin, unspecified: Secondary | ICD-10-CM | POA: Diagnosis not present

## 2024-05-20 DIAGNOSIS — Z8639 Personal history of other endocrine, nutritional and metabolic disease: Secondary | ICD-10-CM

## 2024-05-20 DIAGNOSIS — Z8679 Personal history of other diseases of the circulatory system: Secondary | ICD-10-CM

## 2024-05-20 DIAGNOSIS — R5383 Other fatigue: Secondary | ICD-10-CM | POA: Diagnosis not present

## 2024-05-20 DIAGNOSIS — Z8719 Personal history of other diseases of the digestive system: Secondary | ICD-10-CM

## 2024-05-20 DIAGNOSIS — K2100 Gastro-esophageal reflux disease with esophagitis, without bleeding: Secondary | ICD-10-CM | POA: Diagnosis not present

## 2024-05-20 DIAGNOSIS — M1A09X1 Idiopathic chronic gout, multiple sites, with tophus (tophi): Secondary | ICD-10-CM

## 2024-05-20 DIAGNOSIS — F5101 Primary insomnia: Secondary | ICD-10-CM

## 2024-05-20 DIAGNOSIS — I10 Essential (primary) hypertension: Secondary | ICD-10-CM | POA: Diagnosis not present

## 2024-05-20 DIAGNOSIS — G8929 Other chronic pain: Secondary | ICD-10-CM

## 2024-05-20 DIAGNOSIS — K200 Eosinophilic esophagitis: Secondary | ICD-10-CM | POA: Diagnosis not present

## 2024-05-20 DIAGNOSIS — M79652 Pain in left thigh: Secondary | ICD-10-CM | POA: Diagnosis not present

## 2024-05-20 DIAGNOSIS — F411 Generalized anxiety disorder: Secondary | ICD-10-CM | POA: Diagnosis not present

## 2024-05-20 DIAGNOSIS — Z79899 Other long term (current) drug therapy: Secondary | ICD-10-CM

## 2024-06-09 ENCOUNTER — Ambulatory Visit

## 2024-06-09 DIAGNOSIS — J302 Other seasonal allergic rhinitis: Secondary | ICD-10-CM

## 2024-07-09 ENCOUNTER — Ambulatory Visit (INDEPENDENT_AMBULATORY_CARE_PROVIDER_SITE_OTHER)

## 2024-07-09 DIAGNOSIS — J302 Other seasonal allergic rhinitis: Secondary | ICD-10-CM

## 2024-09-23 ENCOUNTER — Ambulatory Visit (INDEPENDENT_AMBULATORY_CARE_PROVIDER_SITE_OTHER): Admitting: Gastroenterology

## 2024-10-26 ENCOUNTER — Ambulatory Visit

## 2024-12-24 ENCOUNTER — Ambulatory Visit: Admitting: Allergy & Immunology
